# Patient Record
Sex: Female | Born: 1935
Health system: Southern US, Community
[De-identification: ages and names within clinical notes are randomized; demographics above are authoritative.]

## PROBLEM LIST (undated history)

## (undated) DIAGNOSIS — I10 Essential (primary) hypertension: Secondary | ICD-10-CM

## (undated) DIAGNOSIS — J189 Pneumonia, unspecified organism: Secondary | ICD-10-CM

## (undated) DIAGNOSIS — E785 Hyperlipidemia, unspecified: Secondary | ICD-10-CM

## (undated) DIAGNOSIS — F0280 Dementia in other diseases classified elsewhere without behavioral disturbance: Secondary | ICD-10-CM

## (undated) DIAGNOSIS — M1990 Unspecified osteoarthritis, unspecified site: Secondary | ICD-10-CM

## (undated) DIAGNOSIS — R569 Unspecified convulsions: Secondary | ICD-10-CM

## (undated) DIAGNOSIS — E039 Hypothyroidism, unspecified: Secondary | ICD-10-CM

## (undated) DIAGNOSIS — H409 Unspecified glaucoma: Secondary | ICD-10-CM

## (undated) DIAGNOSIS — E559 Vitamin D deficiency, unspecified: Secondary | ICD-10-CM

## (undated) DIAGNOSIS — G309 Alzheimer's disease, unspecified: Secondary | ICD-10-CM

## (undated) DIAGNOSIS — T7840XA Allergy, unspecified, initial encounter: Secondary | ICD-10-CM

## (undated) DIAGNOSIS — F329 Major depressive disorder, single episode, unspecified: Secondary | ICD-10-CM

## (undated) DIAGNOSIS — B1920 Unspecified viral hepatitis C without hepatic coma: Secondary | ICD-10-CM

## (undated) DIAGNOSIS — K219 Gastro-esophageal reflux disease without esophagitis: Secondary | ICD-10-CM

## (undated) DIAGNOSIS — F32A Depression, unspecified: Secondary | ICD-10-CM

## (undated) DIAGNOSIS — E119 Type 2 diabetes mellitus without complications: Secondary | ICD-10-CM

## (undated) HISTORY — PX: CATARACT EXTRACTION: SUR2

## (undated) HISTORY — PX: ABDOMINAL HYSTERECTOMY: SHX81

## (undated) HISTORY — DX: Allergy, unspecified, initial encounter: T78.40XA

## (undated) HISTORY — DX: Unspecified osteoarthritis, unspecified site: M19.90

## (undated) HISTORY — DX: Vitamin D deficiency, unspecified: E55.9

## (undated) HISTORY — DX: Alzheimer's disease, unspecified: G30.9

## (undated) HISTORY — PX: CORNEAL TRANSPLANT: SHX108

## (undated) HISTORY — DX: Unspecified glaucoma: H40.9

## (undated) HISTORY — PX: EYE SURGERY: SHX253

## (undated) HISTORY — DX: Hyperlipidemia, unspecified: E78.5

## (undated) HISTORY — DX: Dementia in other diseases classified elsewhere without behavioral disturbance: F02.80

---

## 1998-03-05 ENCOUNTER — Emergency Department (HOSPITAL_COMMUNITY): Admission: EM | Admit: 1998-03-05 | Discharge: 1998-03-05 | Payer: Self-pay

## 2001-03-30 ENCOUNTER — Emergency Department (HOSPITAL_COMMUNITY): Admission: EM | Admit: 2001-03-30 | Discharge: 2001-03-30 | Payer: Self-pay | Admitting: Emergency Medicine

## 2005-12-22 ENCOUNTER — Emergency Department (HOSPITAL_COMMUNITY): Admission: EM | Admit: 2005-12-22 | Discharge: 2005-12-22 | Payer: Self-pay | Admitting: Family Medicine

## 2006-01-06 ENCOUNTER — Emergency Department (HOSPITAL_COMMUNITY): Admission: EM | Admit: 2006-01-06 | Discharge: 2006-01-06 | Payer: Self-pay | Admitting: Family Medicine

## 2006-03-11 ENCOUNTER — Ambulatory Visit (HOSPITAL_COMMUNITY): Admission: RE | Admit: 2006-03-11 | Discharge: 2006-03-11 | Payer: Self-pay | Admitting: Internal Medicine

## 2006-10-28 ENCOUNTER — Emergency Department (HOSPITAL_COMMUNITY): Admission: EM | Admit: 2006-10-28 | Discharge: 2006-10-28 | Payer: Self-pay | Admitting: Family Medicine

## 2007-01-12 ENCOUNTER — Emergency Department (HOSPITAL_COMMUNITY): Admission: EM | Admit: 2007-01-12 | Discharge: 2007-01-12 | Payer: Self-pay | Admitting: *Deleted

## 2007-02-13 LAB — HM COLONOSCOPY

## 2007-08-15 ENCOUNTER — Emergency Department (HOSPITAL_COMMUNITY): Admission: EM | Admit: 2007-08-15 | Discharge: 2007-08-15 | Payer: Self-pay | Admitting: Emergency Medicine

## 2008-04-03 ENCOUNTER — Inpatient Hospital Stay (HOSPITAL_COMMUNITY): Admission: EM | Admit: 2008-04-03 | Discharge: 2008-04-09 | Payer: Self-pay | Admitting: Emergency Medicine

## 2008-04-05 ENCOUNTER — Encounter (INDEPENDENT_AMBULATORY_CARE_PROVIDER_SITE_OTHER): Payer: Self-pay | Admitting: Internal Medicine

## 2009-03-03 ENCOUNTER — Ambulatory Visit: Payer: Self-pay | Admitting: Gastroenterology

## 2010-05-30 LAB — CBC
Hemoglobin: 10.1 g/dL — ABNORMAL LOW (ref 12.0–15.0)
Hemoglobin: 10.1 g/dL — ABNORMAL LOW (ref 12.0–15.0)
Hemoglobin: 10.1 g/dL — ABNORMAL LOW (ref 12.0–15.0)
Hemoglobin: 11.8 g/dL — ABNORMAL LOW (ref 12.0–15.0)
Hemoglobin: 9.7 g/dL — ABNORMAL LOW (ref 12.0–15.0)
MCHC: 34.6 g/dL (ref 30.0–36.0)
MCHC: 34.6 g/dL (ref 30.0–36.0)
MCHC: 34.7 g/dL (ref 30.0–36.0)
MCV: 100.1 fL — ABNORMAL HIGH (ref 78.0–100.0)
MCV: 99.6 fL (ref 78.0–100.0)
RBC: 2.79 MIL/uL — ABNORMAL LOW (ref 3.87–5.11)
RBC: 2.89 MIL/uL — ABNORMAL LOW (ref 3.87–5.11)
RDW: 13.1 % (ref 11.5–15.5)
RDW: 13.4 % (ref 11.5–15.5)
RDW: 13.4 % (ref 11.5–15.5)
WBC: 14.2 10*3/uL — ABNORMAL HIGH (ref 4.0–10.5)
WBC: 8.6 10*3/uL (ref 4.0–10.5)

## 2010-05-30 LAB — BASIC METABOLIC PANEL
CO2: 23 mEq/L (ref 19–32)
CO2: 25 mEq/L (ref 19–32)
CO2: 26 mEq/L (ref 19–32)
Calcium: 8.6 mg/dL (ref 8.4–10.5)
Calcium: 8.8 mg/dL (ref 8.4–10.5)
Calcium: 9.3 mg/dL (ref 8.4–10.5)
Chloride: 110 mEq/L (ref 96–112)
Creatinine, Ser: 0.58 mg/dL (ref 0.4–1.2)
Creatinine, Ser: 0.58 mg/dL (ref 0.4–1.2)
GFR calc Af Amer: >60 mL/min (ref >60)
GFR calc Af Amer: >60 mL/min (ref >60)
GFR calc non Af Amer: >60 mL/min (ref >60)
GFR calc non Af Amer: >60 mL/min (ref >60)
Glucose, Bld: 112 mg/dL — ABNORMAL HIGH (ref 70–99)
Glucose, Bld: 129 mg/dL — ABNORMAL HIGH (ref 70–99)
Sodium: 139 mEq/L (ref 135–145)
Sodium: 140 mEq/L (ref 135–145)

## 2010-05-30 LAB — CULTURE, BLOOD (ROUTINE X 2): Culture: NO GROWTH

## 2010-05-30 LAB — URINALYSIS, ROUTINE W REFLEX MICROSCOPIC
Glucose, UA: NEGATIVE mg/dL
Nitrite: NEGATIVE
Protein, ur: NEGATIVE mg/dL
pH: 5.5 (ref 5.0–8.0)

## 2010-05-30 LAB — CARDIAC PANEL(CRET KIN+CKTOT+MB+TROPI)
CK, MB: 2 ng/mL (ref 0.3–4.0)
Relative Index: INVALID (ref 0.0–2.5)
Relative Index: INVALID (ref 0.0–2.5)
Troponin I: 0.01 ng/mL (ref 0.00–0.06)

## 2010-05-30 LAB — IRON AND TIBC
Iron: 32 ug/dL — ABNORMAL LOW (ref 42–135)
Saturation Ratios: 20 % (ref 20–55)
TIBC: 158 ug/dL — ABNORMAL LOW (ref 250–470)
UIBC: 126 ug/dL

## 2010-05-30 LAB — POCT CARDIAC MARKERS: Myoglobin, poc: 143 ng/mL (ref 12–200)

## 2010-05-30 LAB — TSH: TSH: 2.013 u[IU]/mL (ref 0.350–4.500)

## 2010-05-30 LAB — POCT I-STAT, CHEM 8
BUN: 19 mg/dL (ref 6–23)
Calcium, Ion: 1.15 mmol/L (ref 1.12–1.32)
HCT: 35 % — ABNORMAL LOW (ref 36.0–46.0)
Hemoglobin: 11.9 g/dL — ABNORMAL LOW (ref 12.0–15.0)
Sodium: 135 mEq/L (ref 135–145)
TCO2: 24 mmol/L (ref 0–100)

## 2010-05-30 LAB — TROPONIN I: Troponin I: 0.03 ng/mL (ref 0.00–0.06)

## 2010-05-30 LAB — GLUCOSE, CAPILLARY: Glucose-Capillary: 156 mg/dL — ABNORMAL HIGH (ref 70–99)

## 2010-05-30 LAB — LIPID PANEL
Cholesterol: 164 mg/dL (ref 0–200)
HDL: 65 mg/dL (ref >39)
Triglycerides: 64 mg/dL (ref <150)

## 2010-05-30 LAB — CK TOTAL AND CKMB (NOT AT ARMC): CK, MB: 1.1 ng/mL (ref 0.3–4.0)

## 2010-05-30 LAB — DIFFERENTIAL
Basophils Absolute: 0 10*3/uL (ref 0.0–0.1)
Lymphocytes Relative: 12 % (ref 12–46)
Monocytes Absolute: 1 10*3/uL (ref 0.1–1.0)
Neutro Abs: 8.6 10*3/uL — ABNORMAL HIGH (ref 1.7–7.7)
Neutrophils Relative %: 79 % — ABNORMAL HIGH (ref 43–77)

## 2010-05-30 LAB — HEMOGLOBIN A1C: Hgb A1c MFr Bld: 6 % (ref 4.6–6.1)

## 2010-05-30 LAB — VITAMIN B12: Vitamin B-12: 270 pg/mL (ref 211–911)

## 2010-05-30 LAB — PROTIME-INR
INR: 1.1 (ref 0.00–1.49)
Prothrombin Time: 14.5 seconds (ref 11.6–15.2)

## 2010-05-30 LAB — APTT: aPTT: 33 seconds (ref 24–37)

## 2010-06-27 NOTE — Group Therapy Note (Signed)
[name], [name]             ACCOUNT NO.:  [id number]   MEDICAL RECORD NO.:  [id number]          PATIENT TYPE:  INP   LOCATION:  5019                         FACILITY:  [facility]   PHYSICIAN:  Eduard Clos, MDDATE OF BIRTH:  [birth year]                                 PROGRESS NOTE   HOSPITAL COURSE:  An 77 year old female with history of hypertension, hyperlipidemia,  arthritis who had recurrent falls and was brought into the ER.  On  admission in the ER, the patient had a CT of the head which did not show  any acute findings.  X-ray of his chest showed pneumonia.  The patient  was admitted to telemetry floor.  Cardiac enzymes were followed.   The patient had an x-ray of the hip which was suggestive of possible  fracture, and MRI of the hip on the left side was done which showed no  fractures.  The patient was monitored on a telemetry bed.  The patient  did have some abnormal beats with possibility of NSVT.  Cardiology was  consulted.  Cardiology consult was obtained with Dr. Reyes Ivan who advised  that the patient had artifact and it was felt NSVT.  Further  recommendations and followup will be done as an outpatient with regard  to cardiac workup.   The patient at this time is to stay on Rocephin and Zithromax, and today  she had another spike of fever, though the patient is not in any acute  distress.  We will repeat a chest x-ray in the a.m.  We will continue  the present antibiotics.  Will try to get a sputum culture and  sensitivity.  Blood cultures have been so far negative.  PT consult was  obtained who advised the patient can be discharged once ready with home  OT and PT.   PROCEDURES DONE DURING THE STAY:  1. CT of the head without contrast on April 02, 2008.  Atrophy and      small vessel disease.  No acute abnormality.  2. X-ray of the knee on April 02, 2008.  Stable degenerative      changes.  No fracture or effusion.  3. X-ray of the hip of April 02, 2008 shows no definite acute      fracture, but it is too difficult to exclude a subtle left      subcapital femoral fracture.  Consider MRI.  4. Chest x-ray April 02, 2008 shows slight prominent marking at the      left lung base.  Cannot exclude pneumonia.  __________ right upper      __________.  5. MRI of the hip, left, April 03, 2008 shows osteoarthritic      changes of both hips without spurring at the base of the femoral      head.  No acute abnormalities.   FINAL DIAGNOSES:  1. Pneumonia.  2. Hypertension.  3. Arthritis.  4. Hyperlipidemia.  5. Anemia probably of chronic disease.   PLAN:  The patient is presently on antibiotics.  Will repeat a chest x-ray.  If  the patient's symptoms and fever settles, the  patient probably can be  discharged home with OT and PT, with remaining days of antibiotics.      Eduard Clos, MD  Electronically Signed     ANK/MEDQ  D:  04/06/2008  T:  04/06/2008  Job:  [id number]

## 2010-06-27 NOTE — Discharge Summary (Signed)
NAMEIVON, ROEDEL             ACCOUNT NO.:  [id number]   MEDICAL RECORD NO.:  [id number]          PATIENT TYPE:  INP   LOCATION:  5019                         FACILITY:  [facility]   PHYSICIAN:  Isidor Holts, M.D.  DATE OF BIRTH:  [birth year]   DATE OF ADMISSION:  04/02/2008  DATE OF DISCHARGE:                               DISCHARGE SUMMARY   PMD:  Dr. Marisue Brooklyn.   For discharge diagnoses, refer to interim summary dictated April 06, 2008, by Dr. Midge Minium. Refer to above summary for details of  admission history, procedures and detailed clinical course. In addition  however, for the period from the April 07, 2008 to April 08, 2008,  i.e. the date of this dictation, the following are pertinent:  The  patient had spiked a pyrexia of 101.3 in the a.m. of April 06, 2008,  but through April 07, 2008 and April 08, 2008, there was no  relapse. The patient was transitioned to monotherapy with Avelox from  a.m. of April 08, 2008, and Rocephin/Azithromycin were discontinued  after 5 days of antibiotic therapy, without any deleterious  consequences.  White cell count as of April 08, 2008, had normalized  at 8.5. The patient felt considerably better and chest x-ray of April 07, 2008 showed coarsening of the pulmonary interstitium, mild patchy  airspace disease in the left lung base showed some improvement.  There  was no pleural effusion. Heart size was normal. Blood pressure was  addressed with titration of antihypertensive medications, and by  April 08, 2008, the patient was normotensive with a BP 123/57 mmHg.  She was continued on statin for her history of dyslipidemia.  Osteoarthritis was addressed with simple analgesics, and the patient was  evaluated by PT/OT, who have recommended continued PT/OT in the home  environment, in addition to home safety evaluation as well as rolling  walker and tub transfer bench.  All these have been arranged.   The  patient's iron studies showed the following findings.  Iron 32, TIBC  158, percentage saturation 20.  B12 was marginal at 270, folate was  normal at 17.5, ferritin was 455.  These findings appeared consistent  with anemia of chronic disease in association with borderline B12  deficiency.  The patient has been started on Vitamin B12 oral  supplements, accordingly.  Hemoglobin as of April 08, 2008, was  considered reasonable at 10.1 with a hematocrit of 29.3 and MCV of 98.4.  The decision as to whether the patient will merit GI workup for her  anemia, will be deferred to the patient's primary MD, Dr. Marisue Brooklyn.  This may be particularly pertinent, if the patient has not had a routine  screening endoscopy at appropriate intervals.   DISPOSITION:  The patient was on April 08, 2008, considered  clinically stable for discharge on April 09, 2008, provided no acute  problems arise in the interim.  She is anticipated to complete a further  7 days of oral antibiotic therapy on April 16, 2008.   DISCHARGE MEDICATIONS:  1. Micardis 80 mg p.o. daily.  2. Vitamin C 1000 mg p.o.  daily.  3. Vitamin D 2000 international units p.o. daily.  4. Vitamin B12 1000 mcg p.o. daily.  5. Meloxicam 7.5 mg p.o p.r.n. daily  6. Tylenol 650 mm p.o. p.r.n. q. 6 hourly.  7. Avelox 400 mg p.o. daily x 7 days, from 04/10/08.  8. Levobunolol 0.5% eye drops. 1 drop left eye b.i.d.  9. Lotemax 0.5% eye drops. 1 drop each eye q.i.d.  10.Lovastatin 40 mg p.o. q.h.s.   DIET:  Heart-healthy.   ACTIVITY:  Recommended to increase activity slowly. Otherwise per PT/OT.   FOLLOW-UP INSTRUCTIONS:  The patient is to follow up with her primary  MD, Dr. Marisue Brooklyn, in 1 week. Family has been instructed to call for  an appointment.   SPECIAL INSTRUCTIONS:  Home health PT/OT  has been arranged as well as  rolling walker and tub transfer bench.      Isidor Holts, M.D.  Electronically Signed      CO/MEDQ  D:  04/08/2008  T:  04/08/2008  Job:  [id number]   cc:   Lovenia Kim, D.O.

## 2010-06-27 NOTE — H&P (Signed)
[name], [name] NO.:  [id number]   MEDICAL RECORD NO.:  [id number]          PATIENT TYPE:  EMS   LOCATION:  MAJO                         FACILITY:  [facility]   PHYSICIAN:  Vania Rea, M.D. DATE OF BIRTH:  1922-11-26   DATE OF ADMISSION:  04/03/2008  DATE OF DISCHARGE:                              HISTORY & PHYSICAL   PRIMARY CARE PHYSICIAN:  Dr. Lovenia Kim.   CHIEF COMPLAINT:  Fall.   HISTORY OF PRESENT ILLNESS:  This is an 77 year old African American  lady with a history of hypertension and arthritis and recurrent visits  to the emergency room for falls, who is brought into the emergency room  with her daughters who complain that she has been getting progressively  weak over the past 1-week, and she reports that she fell trying to get  out of bed.  The daughters have left and are no longer currently  available, and the patient only reports a fall.  She denies any fever,  cough or cold.  She denies any chest pains or shortness of breath.  However, her history is somewhat unreliable because at time her mind  seems to be wandering.   PAST MEDICAL HISTORY:  1. Hypertension.  2. Arthritis.  3. Hyperlipidemia.   PAST SURGICAL HISTORY:  Hysterectomy.   MEDICATIONS:  She is unsure of the dosages, but records list atenolol,  lovastatin, Diovan and Pepcid.   ALLERGIES:  NO KNOWN DRUG ALLERGIES.   SOCIAL HISTORY:  No history of tobacco, alcohol or illicit drug use.   FAMILY HISTORY:  She denies any knowledge of any family medical  problems.   REVIEW OF SYSTEMS:  Other than noted above significant only for  constipation and blindness in the right eye.   PHYSICAL EXAMINATION:  GENERAL:  An elderly African American lady  smiling pleasantly, lying in bed.  VITAL SIGNS:  Her temperature is 100, pulse 106, respirations 20, blood  pressure 165/75.  She is saturating at 97% on room air.  HEENT:  Her left pupil is small, her right pupil is opaque.  NECK:  She has no cervical lymphadenopathy or thyromegaly.  No carotid  bruit.  CHEST:  She has decreased breath sounds in the right chest.  CARDIOVASCULAR:  Regular rhythm.  No murmur heard.  ABDOMEN:  Soft and nontender.  There are no masses.  EXTREMITIES:  Without edema.  She has abrasions on the right knee.  CENTRAL NERVOUS SYSTEM:  Cranial nerves II through XII grossly intact.  She has no focal lateralizing signs.  The patient seems to have great  difficulty supporting herself to walk, although she alleges she can  walk.   LABORATORY DATA:  White count is slightly elevated at 11.0, hemoglobin  is 11.8, platelets 139, absolute granulocyte count is elevated at 6.6.  Cardiac enzymes are completely normal.  Her serum chemistry is  essentially normal.  BUN is 19 and creatinine is 1.1.  Glucose is 164.  Two-view chest x-ray shows slightly prominent markings in the left lung  base, cannot exclude pneumonia.  Her CT scan of the pelvis - no definite  acute fracture, but it is difficult to exclude a subtle left subcapital  femoral fracture.  Consider a non-emergent MRI to assess further.  X-  rays of the knee show no fractures or effusions.   ASSESSMENT:  1. Progressive weakness, possibly related to left lower lobe      pneumonia.  2. Fall with impaired mobility.  3. Fever and tachycardia, differential includes a pulmonary embolus.  4. Probable mild dementia.   PLAN:  1. We will admit this lady for hydration and antibiotics.  We will      check her D-dimer and if elevated, we will go ahead and get a CT      angiogram of the chest.  We will order an MRI of the hip and if      there is no fracture, then will recommend her for physical therapy      to improve her gait.  2. We will monitor her blood pressure and request medications from the      pharmacy so we can continue to control her blood pressure and her      blood sugar.  3. For her hyperglycemia we will check a hemoglobin  A1c.      Vania Rea, M.D.  Electronically Signed     LC/MEDQ  D:  04/03/2008  T:  04/03/2008  Job:  [id number]   cc:   Lovenia Kim, D.O.

## 2010-11-07 ENCOUNTER — Other Ambulatory Visit: Payer: Self-pay | Admitting: Internal Medicine

## 2010-11-07 ENCOUNTER — Other Ambulatory Visit (HOSPITAL_COMMUNITY): Payer: Self-pay | Admitting: Internal Medicine

## 2010-11-07 DIAGNOSIS — B1920 Unspecified viral hepatitis C without hepatic coma: Secondary | ICD-10-CM

## 2010-11-07 DIAGNOSIS — T17308A Unspecified foreign body in larynx causing other injury, initial encounter: Secondary | ICD-10-CM

## 2010-11-09 ENCOUNTER — Ambulatory Visit
Admission: RE | Admit: 2010-11-09 | Discharge: 2010-11-09 | Disposition: A | Discharge status: Home / Self Care | Payer: Medicare Other | Source: Ambulatory Visit | Attending: Internal Medicine | Admitting: Internal Medicine

## 2010-11-09 DIAGNOSIS — B1920 Unspecified viral hepatitis C without hepatic coma: Secondary | ICD-10-CM

## 2010-11-16 ENCOUNTER — Ambulatory Visit (HOSPITAL_COMMUNITY)
Admission: RE | Admit: 2010-11-16 | Discharge: 2010-11-16 | Disposition: A | Discharge status: Home / Self Care | Payer: Medicare Other | Source: Ambulatory Visit | Attending: Internal Medicine | Admitting: Internal Medicine

## 2010-11-16 DIAGNOSIS — R1310 Dysphagia, unspecified: Secondary | ICD-10-CM | POA: Insufficient documentation

## 2010-11-16 DIAGNOSIS — B1920 Unspecified viral hepatitis C without hepatic coma: Secondary | ICD-10-CM | POA: Insufficient documentation

## 2010-11-16 DIAGNOSIS — K219 Gastro-esophageal reflux disease without esophagitis: Secondary | ICD-10-CM | POA: Insufficient documentation

## 2010-11-16 DIAGNOSIS — E119 Type 2 diabetes mellitus without complications: Secondary | ICD-10-CM | POA: Insufficient documentation

## 2010-11-16 DIAGNOSIS — R0609 Other forms of dyspnea: Secondary | ICD-10-CM | POA: Insufficient documentation

## 2010-11-16 DIAGNOSIS — I10 Essential (primary) hypertension: Secondary | ICD-10-CM | POA: Insufficient documentation

## 2010-11-16 DIAGNOSIS — R6889 Other general symptoms and signs: Secondary | ICD-10-CM | POA: Insufficient documentation

## 2010-11-16 DIAGNOSIS — T17308A Unspecified foreign body in larynx causing other injury, initial encounter: Secondary | ICD-10-CM

## 2010-11-16 DIAGNOSIS — R0989 Other specified symptoms and signs involving the circulatory and respiratory systems: Secondary | ICD-10-CM | POA: Insufficient documentation

## 2012-01-30 ENCOUNTER — Emergency Department (HOSPITAL_COMMUNITY)
Admission: EM | Admit: 2012-01-30 | Discharge: 2012-01-30 | Disposition: A | Discharge status: Home / Self Care | Payer: BC Managed Care – PPO | Source: Home / Self Care | Attending: Emergency Medicine | Admitting: Emergency Medicine

## 2012-01-30 ENCOUNTER — Encounter (HOSPITAL_COMMUNITY): Payer: Self-pay | Admitting: Emergency Medicine

## 2012-01-30 DIAGNOSIS — R296 Repeated falls: Secondary | ICD-10-CM

## 2012-01-30 DIAGNOSIS — Z9181 History of falling: Secondary | ICD-10-CM

## 2012-01-30 DIAGNOSIS — M25569 Pain in unspecified knee: Secondary | ICD-10-CM

## 2012-01-30 HISTORY — DX: Unspecified osteoarthritis, unspecified site: M19.90

## 2012-01-30 HISTORY — DX: Essential (primary) hypertension: I10

## 2012-01-30 NOTE — ED Notes (Signed)
Daughter brings pt in for bilateral knee pain/soreness x3 days... Saw PCP yesterday in re: to pain and has been setup w/PT... Daughter states that the pt has fallen x2 today and they just want to make sure she did not brake anything... Pt is alert and responsive.

## 2012-01-30 NOTE — ED Provider Notes (Signed)
History     CSN: [id number]  Arrival date & time 01/30/12  1741   First MD Initiated Contact with Patient 01/30/12 1800      Chief Complaint  Patient presents with  . Knee Pain    (Consider location/radiation/quality/duration/timing/severity/associated sxs/prior treatment) HPI Comments: To spring her in to be checked for bilateral knee pain and soreness for 3 days. They describe that she fell today to times when trying to walk landing on her knees. This will make sure that she did not fracture her knees or another bone?!!  Mrs. Wednesday are you hurting anywhere? No, I'm not sore anywhere. (Patient is brought in one of our wheelchairs and on request it is extending both of her knees to show me)  Patient is a 78 y.o. female presenting with knee pain. The history is provided by the patient.  Knee Pain This is a new problem. The problem has not changed since onset.Pertinent negatives include no chest pain and no shortness of breath. The symptoms are aggravated by bending (Walking and). Nothing relieves the symptoms. She has tried nothing for the symptoms. The treatment provided no relief.    Past Medical History  Diagnosis Date  . Arthritis   . Hypertension   . Diabetes mellitus without complication     History reviewed. No pertinent past surgical history.  No family history on file.  History  Substance Use Topics  . Smoking status: Never Smoker   . Smokeless tobacco: Not on file  . Alcohol Use: No    OB History    Grav Para Term Preterm Abortions TAB SAB Ect Mult Living                  Review of Systems  Constitutional: Positive for activity change. Negative for fever, chills and appetite change.  Respiratory: Negative for cough and shortness of breath.   Cardiovascular: Negative for chest pain.  Gastrointestinal: Negative for nausea.  Neurological: Negative for dizziness and facial asymmetry.    Allergies  Review of patient's allergies indicates no known  allergies.  Home Medications  No current outpatient prescriptions on file.  BP 132/50  Pulse 76  Temp 99.2 F (37.3 C) (Oral)  Resp 18  SpO2 99%  Physical Exam  Nursing note and vitals reviewed. Constitutional: Vital signs are normal.  Non-toxic appearance. She does not have a sickly appearance. She does not appear ill. No distress.    Musculoskeletal: She exhibits no edema and no tenderness.       Right shoulder: She exhibits normal range of motion and no deformity.       Right knee: She exhibits normal range of motion, no swelling, no effusion, no ecchymosis and no deformity.       Left knee: She exhibits normal range of motion, no swelling, no effusion, no ecchymosis and no deformity.       Legs: Neurological: She is alert.  Skin: No rash noted. No erythema.    ED Course  Procedures (including critical care time)  Labs Reviewed - No data to display No results found.   1. Falls frequently       MDM  Family members bring this pulling tonight to be checked for her niece's A. suspect she is hurting in both knees. She has been falling frequently and was seen by her doctor yesterday. Relatives describe a possible physical therapy- coordination of care.         Jimmie Molly, MD 01/30/12 [phone]

## 2012-06-09 ENCOUNTER — Emergency Department (HOSPITAL_COMMUNITY): Payer: Medicare Other

## 2012-06-09 ENCOUNTER — Encounter (HOSPITAL_COMMUNITY): Payer: Self-pay | Admitting: Emergency Medicine

## 2012-06-09 ENCOUNTER — Inpatient Hospital Stay (HOSPITAL_COMMUNITY)
Admission: EM | Admit: 2012-06-09 | Discharge: 2012-06-12 | DRG: 640 | Disposition: A | Discharge status: Home Health | Payer: Medicare Other | Attending: Internal Medicine | Admitting: Internal Medicine

## 2012-06-09 DIAGNOSIS — R4182 Altered mental status, unspecified: Secondary | ICD-10-CM

## 2012-06-09 DIAGNOSIS — E119 Type 2 diabetes mellitus without complications: Secondary | ICD-10-CM

## 2012-06-09 DIAGNOSIS — G92 Toxic encephalopathy: Secondary | ICD-10-CM | POA: Diagnosis present

## 2012-06-09 DIAGNOSIS — E860 Dehydration: Principal | ICD-10-CM

## 2012-06-09 DIAGNOSIS — D638 Anemia in other chronic diseases classified elsewhere: Secondary | ICD-10-CM | POA: Diagnosis present

## 2012-06-09 DIAGNOSIS — E1121 Type 2 diabetes mellitus with diabetic nephropathy: Secondary | ICD-10-CM | POA: Diagnosis present

## 2012-06-09 DIAGNOSIS — Z79899 Other long term (current) drug therapy: Secondary | ICD-10-CM

## 2012-06-09 DIAGNOSIS — G929 Unspecified toxic encephalopathy: Secondary | ICD-10-CM | POA: Diagnosis present

## 2012-06-09 DIAGNOSIS — R4702 Dysphasia: Secondary | ICD-10-CM | POA: Diagnosis present

## 2012-06-09 DIAGNOSIS — R627 Adult failure to thrive: Secondary | ICD-10-CM

## 2012-06-09 DIAGNOSIS — K5900 Constipation, unspecified: Secondary | ICD-10-CM

## 2012-06-09 DIAGNOSIS — G9340 Encephalopathy, unspecified: Secondary | ICD-10-CM

## 2012-06-09 DIAGNOSIS — R1310 Dysphagia, unspecified: Secondary | ICD-10-CM | POA: Diagnosis present

## 2012-06-09 DIAGNOSIS — B1920 Unspecified viral hepatitis C without hepatic coma: Secondary | ICD-10-CM

## 2012-06-09 DIAGNOSIS — Z66 Do not resuscitate: Secondary | ICD-10-CM | POA: Diagnosis present

## 2012-06-09 DIAGNOSIS — E785 Hyperlipidemia, unspecified: Secondary | ICD-10-CM

## 2012-06-09 DIAGNOSIS — I10 Essential (primary) hypertension: Secondary | ICD-10-CM

## 2012-06-09 DIAGNOSIS — R5381 Other malaise: Secondary | ICD-10-CM | POA: Diagnosis present

## 2012-06-09 DIAGNOSIS — D649 Anemia, unspecified: Secondary | ICD-10-CM

## 2012-06-09 DIAGNOSIS — E782 Mixed hyperlipidemia: Secondary | ICD-10-CM | POA: Diagnosis present

## 2012-06-09 HISTORY — DX: Unspecified viral hepatitis C without hepatic coma: B19.20

## 2012-06-09 LAB — CBC WITH DIFFERENTIAL/PLATELET
Basophils Absolute: 0 10*3/uL (ref 0.0–0.1)
Basophils Relative: 0 % (ref 0–1)
HCT: 31.9 % — ABNORMAL LOW (ref 36.0–46.0)
Lymphocytes Relative: 34 % (ref 12–46)
Neutro Abs: 5.7 10*3/uL (ref 1.7–7.7)
Neutrophils Relative %: 55 % (ref 43–77)
Platelets: 247 10*3/uL (ref 150–400)
RDW: 13.3 % (ref 11.5–15.5)
WBC: 10.2 10*3/uL (ref 4.0–10.5)

## 2012-06-09 LAB — COMPREHENSIVE METABOLIC PANEL
ALT: 38 U/L — ABNORMAL HIGH (ref 0–35)
AST: 37 U/L (ref 0–37)
Albumin: 3.3 g/dL — ABNORMAL LOW (ref 3.5–5.2)
CO2: 29 mEq/L (ref 19–32)
Chloride: 100 mEq/L (ref 96–112)
GFR calc non Af Amer: 45 mL/min — ABNORMAL LOW (ref >90)
Potassium: 4.8 mEq/L (ref 3.5–5.1)
Sodium: 137 mEq/L (ref 135–145)
Total Bilirubin: 0.4 mg/dL (ref 0.3–1.2)

## 2012-06-09 MED ORDER — SODIUM CHLORIDE 0.9 % IV SOLN: INTRAVENOUS | Status: DC

## 2012-06-09 NOTE — ED Notes (Signed)
Pts family states "She is weak and is not eating or drinking and keeps falling"

## 2012-06-09 NOTE — ED Notes (Signed)
[name] [phone] Password: God is

## 2012-06-09 NOTE — ED Provider Notes (Signed)
History     CSN: [id number]  Arrival date & time 06/09/12  1622   First MD Initiated Contact with Patient 06/09/12 2301      Chief Complaint  Patient presents with  . Weakness    (Consider location/radiation/quality/duration/timing/severity/associated sxs/prior treatment) HPI Level 5 Caveat: altered mental status. This is an 79 year old female who has had a general decline over the last 2 months. This is worsened over the past week. Specifically her family states that she has become more lethargic, with decreased appetite, decreased intake, decreased urine output and constipation. She has also been falling frequently, partly due to chronic pain in her right knee. For the past 2 days she has been essentially bedbound. She has been confused and refusing to use at all. She was seen by her primary care physician in his office today and was sent to the ED. Symptoms are moderate to severe. There no specific exacerbating or mitigating factors except that her constipation was partially relieved by manual disimpaction by a relative who is an LPN.  Past Medical History  Diagnosis Date  . Arthritis   . Hypertension   . Diabetes mellitus without complication   . Hepatitis C     History reviewed. No pertinent past surgical history.  No family history on file.  History  Substance Use Topics  . Smoking status: Never Smoker   . Smokeless tobacco: Not on file  . Alcohol Use: No    OB History   Grav Para Term Preterm Abortions TAB SAB Ect Mult Living                  Review of Systems  Unable to perform ROS   Allergies  Review of patient's allergies indicates no known allergies.  Home Medications   Current Outpatient Rx  Name  Route  Sig  Dispense  Refill  . diclofenac (VOLTAREN) 50 MG EC tablet   Oral   Take 50 mg by mouth 2 (two) times daily.         Marland Kitchen levobunolol (BETAGAN) 0.5 % ophthalmic solution   Both Eyes   Place 1 drop into both eyes 2 (two) times daily.          Marland Kitchen losartan (COZAAR) 100 MG tablet   Oral   Take 100 mg by mouth daily.         Marland Kitchen lovastatin (MEVACOR) 40 MG tablet   Oral   Take 40 mg by mouth at bedtime.         . metFORMIN (GLUMETZA) 500 MG (MOD) 24 hr tablet   Oral   Take 500-1,000 mg by mouth 2 (two) times daily with a meal. Takes 1 tablet(500mg ) every morning  And 2 tablets(1000mg ) every evening           BP 158/56  Pulse 58  Temp(Src) 97.9 F (36.6 C) (Oral)  Resp 16  SpO2 94%  Physical Exam General: Well-developed, well-nourished female in no acute distress; appearance consistent with age of record HENT: normocephalic, atraumatic Eyes: Right pupil obscured by opaque cornea; left pupil round reactive with partial opacity of left cornea Neck: supple Heart: regular rate and rhythm; distant sounds Lungs: clear to auscultation bilaterally Abdomen: soft; nondistended; nontender; bowel sounds present Extremities: Arthritic changes; pulses normal; no edema Neurologic: Awake, alert and oriented to person and place of; motor function intact in all extremities and symmetric but with general weakness and poor effort; no facial droop Skin: Warm and dry     ED Course  Procedures (including critical care time)     MDM   Nursing notes and vitals signs, including pulse oximetry, reviewed.  Summary of this visit's results, reviewed by myself:  Labs:  Results for orders placed during the hospital encounter of 06/09/12 (from the past 24 hour(s))  CBC WITH DIFFERENTIAL     Status: Abnormal   Collection Time    06/09/12  6:10 PM      Result Value Range   WBC 10.2  4.0 - 10.5 K/uL   RBC 3.30 (*) 3.87 - 5.11 MIL/uL   Hemoglobin 10.7 (*) 12.0 - 15.0 g/dL   HCT 16.1 (*) 09.6 - 04.5 %   MCV 96.7  78.0 - 100.0 fL   MCH 32.4  26.0 - 34.0 pg   MCHC 33.5  30.0 - 36.0 g/dL   RDW 40.9  81.1 - 91.4 %   Platelets 247  150 - 400 K/uL   Neutrophils Relative 55  43 - 77 %   Neutro Abs 5.7  1.7 - 7.7 K/uL   Lymphocytes  Relative 34  12 - 46 %   Lymphs Abs 3.4  0.7 - 4.0 K/uL   Monocytes Relative 9  3 - 12 %   Monocytes Absolute 1.0  0.1 - 1.0 K/uL   Eosinophils Relative 1  0 - 5 %   Eosinophils Absolute 0.1  0.0 - 0.7 K/uL   Basophils Relative 0  0 - 1 %   Basophils Absolute 0.0  0.0 - 0.1 K/uL  COMPREHENSIVE METABOLIC PANEL     Status: Abnormal   Collection Time    06/09/12  6:10 PM      Result Value Range   Sodium 137  135 - 145 mEq/L   Potassium 4.8  3.5 - 5.1 mEq/L   Chloride 100  96 - 112 mEq/L   CO2 29  19 - 32 mEq/L   Glucose, Bld 116 (*) 70 - 99 mg/dL   BUN 24 (*) 6 - 23 mg/dL   Creatinine, Ser 7.82  0.50 - 1.10 mg/dL   Calcium 95.6 (*) 8.4 - 10.5 mg/dL   Total Protein 8.2  6.0 - 8.3 g/dL   Albumin 3.3 (*) 3.5 - 5.2 g/dL   AST 37  0 - 37 U/L   ALT 38 (*) 0 - 35 U/L   Alkaline Phosphatase 60  39 - 117 U/L   Total Bilirubin 0.4  0.3 - 1.2 mg/dL   GFR calc non Af Amer 45 (*) >90 mL/min   GFR calc Af Amer 53 (*) >90 mL/min  URINALYSIS, ROUTINE W REFLEX MICROSCOPIC     Status: Abnormal   Collection Time    06/09/12 11:47 PM      Result Value Range   Color, Urine YELLOW  YELLOW   APPearance CLOUDY (*) CLEAR   Specific Gravity, Urine 1.015  1.005 - 1.030   pH 5.5  5.0 - 8.0   Glucose, UA NEGATIVE  NEGATIVE mg/dL   Hgb urine dipstick NEGATIVE  NEGATIVE   Bilirubin Urine NEGATIVE  NEGATIVE   Ketones, ur NEGATIVE  NEGATIVE mg/dL   Protein, ur NEGATIVE  NEGATIVE mg/dL   Urobilinogen, UA 1.0  0.0 - 1.0 mg/dL   Nitrite NEGATIVE  NEGATIVE   Leukocytes, UA SMALL (*) NEGATIVE  URINE MICROSCOPIC-ADD ON     Status: Abnormal   Collection Time    06/09/12 11:47 PM      Result Value Range   Squamous Epithelial / LPF FEW (*) RARE  WBC, UA 3-6  <3 WBC/hpf  AMMONIA     Status: None   Collection Time    06/10/12 12:10 AM      Result Value Range   Ammonia 23  11 - 60 umol/L  TROPONIN I     Status: None   Collection Time    06/10/12 12:10 AM      Result Value Range   Troponin I <0.30  <0.30  ng/mL    Imaging Studies: Ct Head Wo Contrast  06/09/2012  *RADIOLOGY REPORT*  Clinical Data: Weakness.  Multiple falls.  Not eating or drinking.  CT HEAD WITHOUT CONTRAST  Technique:  Contiguous axial images were obtained from the base of the skull through the vertex without contrast.  Comparison: 04/02/2008  Findings: Mild diffuse cerebral atrophy.  Mild ventricular dilatation consistent with central atrophy.  Low attenuation changes in the periventricular white matter consistent with small vessel ischemia.  No mass effect or midline shift.  No abnormal extra-axial fluid collections.  Gray-white matter junctions are distinct.  Basal cisterns are not effaced.  No depressed skull fractures.  Visualized paranasal sinuses and mastoid air cells are not opacified.  No significant changes since previous study.  IMPRESSION: No acute intracranial abnormalities.  Mild atrophy and small vessel ischemic changes.   Original Report Authenticated By: Burman Nieves, M.D.      Date: 06/09/2012 6:35 PM  Rate: 57  Rhythm: normal bradycardia  QRS Axis: normal  Intervals: normal  ST/T Wave abnormalities: normal  Conduction Disutrbances: none  Narrative Interpretation:   Comparison with previous EKG: none available               Hanley Seamen, MD 06/10/12 0131

## 2012-06-10 ENCOUNTER — Encounter (HOSPITAL_COMMUNITY): Payer: Self-pay | Admitting: Internal Medicine

## 2012-06-10 DIAGNOSIS — R4702 Dysphasia: Secondary | ICD-10-CM | POA: Diagnosis present

## 2012-06-10 DIAGNOSIS — R4182 Altered mental status, unspecified: Secondary | ICD-10-CM

## 2012-06-10 DIAGNOSIS — I10 Essential (primary) hypertension: Secondary | ICD-10-CM | POA: Diagnosis present

## 2012-06-10 DIAGNOSIS — R627 Adult failure to thrive: Secondary | ICD-10-CM

## 2012-06-10 DIAGNOSIS — E860 Dehydration: Principal | ICD-10-CM

## 2012-06-10 DIAGNOSIS — E782 Mixed hyperlipidemia: Secondary | ICD-10-CM | POA: Diagnosis present

## 2012-06-10 DIAGNOSIS — K5900 Constipation, unspecified: Secondary | ICD-10-CM

## 2012-06-10 DIAGNOSIS — D649 Anemia, unspecified: Secondary | ICD-10-CM | POA: Diagnosis present

## 2012-06-10 DIAGNOSIS — B1920 Unspecified viral hepatitis C without hepatic coma: Secondary | ICD-10-CM | POA: Diagnosis present

## 2012-06-10 DIAGNOSIS — E1121 Type 2 diabetes mellitus with diabetic nephropathy: Secondary | ICD-10-CM | POA: Diagnosis present

## 2012-06-10 LAB — AMMONIA: Ammonia: 23 umol/L (ref 11–60)

## 2012-06-10 LAB — URINE MICROSCOPIC-ADD ON

## 2012-06-10 LAB — GLUCOSE, CAPILLARY
Glucose-Capillary: 89 mg/dL (ref 70–99)
Glucose-Capillary: 98 mg/dL (ref 70–99)
Glucose-Capillary: 206 mg/dL — ABNORMAL HIGH (ref 70–99)

## 2012-06-10 LAB — URINALYSIS, ROUTINE W REFLEX MICROSCOPIC
Bilirubin Urine: NEGATIVE
Glucose, UA: NEGATIVE mg/dL
Hgb urine dipstick: NEGATIVE
Protein, ur: NEGATIVE mg/dL
Urobilinogen, UA: 1 mg/dL (ref 0.0–1.0)

## 2012-06-10 LAB — CBC
HCT: 29.6 % — ABNORMAL LOW (ref 36.0–46.0)
Hemoglobin: 10.3 g/dL — ABNORMAL LOW (ref 12.0–15.0)
MCV: 96.1 fL (ref 78.0–100.0)
RBC: 3.08 MIL/uL — ABNORMAL LOW (ref 3.87–5.11)
RDW: 13.1 % (ref 11.5–15.5)
WBC: 7.1 10*3/uL (ref 4.0–10.5)

## 2012-06-10 LAB — TSH: TSH: 2.486 u[IU]/mL (ref 0.350–4.500)

## 2012-06-10 LAB — TROPONIN I: Troponin I: <0.3 ng/mL (ref <0.30)

## 2012-06-10 LAB — CREATININE, SERUM: GFR calc Af Amer: 69 mL/min — ABNORMAL LOW (ref >90)

## 2012-06-10 MED ORDER — BISACODYL 10 MG RE SUPP
10.0000 mg | Freq: Once | RECTAL | Status: AC
  Administered 2012-06-10: 10 mg via RECTAL
  Filled 2012-06-10: qty 1

## 2012-06-10 MED ORDER — POLYETHYLENE GLYCOL 3350 17 G PO PACK
17.0000 g | PACK | Freq: Every day | ORAL | Status: DC | PRN
  Filled 2012-06-10: qty 1

## 2012-06-10 MED ORDER — INSULIN ASPART 100 UNIT/ML ~~LOC~~ SOLN
0.0000 [IU] | Freq: Three times a day (TID) | SUBCUTANEOUS | Status: DC
  Administered 2012-06-10: 1 [IU] via SUBCUTANEOUS
  Administered 2012-06-11 (×2): 2 [IU] via SUBCUTANEOUS
  Administered 2012-06-11 – 2012-06-12 (×2): 1 [IU] via SUBCUTANEOUS

## 2012-06-10 MED ORDER — FLEET ENEMA 7-19 GM/118ML RE ENEM: 1.0000 | ENEMA | Freq: Once | RECTAL | Status: AC | PRN

## 2012-06-10 MED ORDER — SORBITOL 70 % SOLN
30.0000 mL | Freq: Every day | Status: DC | PRN
  Filled 2012-06-10: qty 30

## 2012-06-10 MED ORDER — LOSARTAN POTASSIUM 50 MG PO TABS
100.0000 mg | ORAL_TABLET | Freq: Every day | ORAL | Status: DC
  Administered 2012-06-10 – 2012-06-12 (×3): 100 mg via ORAL
  Filled 2012-06-10 (×4): qty 2

## 2012-06-10 MED ORDER — SIMVASTATIN 20 MG PO TABS
20.0000 mg | ORAL_TABLET | Freq: Every day | ORAL | Status: DC
  Administered 2012-06-10 – 2012-06-11 (×2): 20 mg via ORAL
  Filled 2012-06-10 (×3): qty 1

## 2012-06-10 MED ORDER — ENOXAPARIN SODIUM 40 MG/0.4ML ~~LOC~~ SOLN
40.0000 mg | SUBCUTANEOUS | Status: DC
  Administered 2012-06-10 – 2012-06-12 (×3): 40 mg via SUBCUTANEOUS
  Filled 2012-06-10 (×4): qty 0.4

## 2012-06-10 MED ORDER — ENSURE COMPLETE PO LIQD
237.0000 mL | Freq: Two times a day (BID) | ORAL | Status: DC
  Administered 2012-06-10 – 2012-06-11 (×2): 237 mL via ORAL

## 2012-06-10 MED ORDER — LEVOBUNOLOL HCL 0.5 % OP SOLN
1.0000 [drp] | Freq: Two times a day (BID) | OPHTHALMIC | Status: DC
  Administered 2012-06-10 – 2012-06-12 (×5): 1 [drp] via OPHTHALMIC
  Filled 2012-06-10 (×2): qty 5

## 2012-06-10 MED ORDER — BIOTENE DRY MOUTH MT LIQD
15.0000 mL | Freq: Two times a day (BID) | OROMUCOSAL | Status: DC
  Administered 2012-06-10 – 2012-06-12 (×3): 15 mL via OROMUCOSAL

## 2012-06-10 MED ORDER — DEXTROSE-NACL 5-0.9 % IV SOLN
INTRAVENOUS | Status: DC
  Administered 2012-06-10 – 2012-06-12 (×4): via INTRAVENOUS

## 2012-06-10 MED ORDER — POLYETHYLENE GLYCOL 3350 17 G PO PACK
17.0000 g | PACK | Freq: Every day | ORAL | Status: DC
  Administered 2012-06-10 – 2012-06-12 (×3): 17 g via ORAL
  Filled 2012-06-10 (×3): qty 1

## 2012-06-10 NOTE — Progress Notes (Addendum)
[facility] PROGRESS NOTE  [name] UJW:[id number] DOB: 1923/01/18 DOA: 06/09/2012 PCP: No primary provider on file.  Brief narrative: [name] is an 79 y.o. female with a past medical history of type 2 diabetes, chronic knee pain, recent functional decline, and frequent falls was brought to the hospital for evaluation of weakness and mental status changes in the setting of having been treated with tramadol for several weeks. Upon initial evaluation in the ER, a head CT was negative for acute changes, with no evidence of underlying infection however she did have an elevated calcium level of 11.9.  Assessment/Plan: Principal Problem:   Acute encephalopathy -Suspect multifactorial with toxic and metabolic causes including polypharmacy and hypercalcemia. -Avoid sedating medications. -CT of the head showed chronic ischemic changes, but nothing acute. -Followup laboratory studies to evaluate underlying cause of hypercalcemia and continue to hydrate. -Send urine culture. Active Problems:   Dysphagia -Continue dysphagia 3 diet. Speech therapy evaluation requested.   Generalized weakness -PT evaluation requested.   Dyslipidemia -Continue Zocor. With muscular weakness, check CK rule out statin induced myopathy.   Hypertension -Suboptimal control of blood pressure on Cozaar. We'll continue to monitor trend and adjust medications if indicated.   Dehydration -Continue hydration.   Obstipation -Likely from hypercalcemia and dehydration. -Will give Dulcolax suppository today and maintain on daily MiraLAX.   Hepatitis C -Liver function studies stable.   Normocytic anemia -Likely anemia of chronic disease. Hemoglobin stable.   Hypercalcemia -Followup intact PTH and vitamin D studies. -If these are unrevealing, we'll need to evaluate for malignancy.   Type 2 diabetes mellitus -Oral hypoglycemics on hold. Continue insulin sensitive sliding scale.  Code Status: DNR Family  Communication: Granddaughter/grandson updated at bedside. Disposition Plan: Home versus rehabilitation depending on progress.   Medical Consultants:  None.  Other Consultants:  Physical therapy  Anti-infectives:  None.  HPI/Subjective: [name] is very lethargic. She opens her eyes to voice but does not interact much and this is a big change per her family. No complaints of pain. Appears disoriented.  Objective: Filed Vitals:   06/09/12 1724 06/10/12 0400 06/10/12 0500  BP: 158/56 171/156 130/70  Pulse: 58 63 62  Temp: 97.9 F (36.6 C)  98.1 F (36.7 C)  TempSrc: Oral  Oral  Resp: 16 22 20   SpO2: 94% 99% 97%    Intake/Output Summary (Last 24 hours) at 06/10/12 1226 Last data filed at 06/10/12 0600  Gross per 24 hour  Intake 146.67 ml  Output      0 ml  Net 146.67 ml    Exam: Gen:  NAD, lethargic Cardiovascular:  RRR, No M/R/G Respiratory:  Lungs diminished in the bases Gastrointestinal:  Abdomen soft, NT/ND, + BS Extremities:  No C/E/C  Data Reviewed: Basic Metabolic Panel:  Recent Labs Lab 06/09/12 1810 06/10/12 0650  NA 137  --   K 4.8  --   CL 100  --   CO2 29  --   GLUCOSE 116*  --   BUN 24*  --   CREATININE 1.06 0.85  CALCIUM 11.9*  --    GFR CrCl is unknown because there is no height on file for the current visit. Liver Function Tests:  Recent Labs Lab 06/09/12 1810  AST 37  ALT 38*  ALKPHOS 60  BILITOT 0.4  PROT 8.2  ALBUMIN 3.3*    Recent Labs Lab 06/10/12 0010  AMMONIA 23    CBC:  Recent Labs Lab 06/09/12 1810 06/10/12 0650  WBC  10.2 7.1  NEUTROABS 5.7  --   HGB 10.7* 10.3*  HCT 31.9* 29.6*  MCV 96.7 96.1  PLT 247 211   Cardiac Enzymes:  Recent Labs Lab 06/10/12 0010  TROPONINI <0.30   CBG:  Recent Labs Lab 06/10/12 0437 06/10/12 0732 06/10/12 1139  GLUCAP 89 98 112*   Thyroid function studies  Recent Labs  06/10/12 0650  TSH 2.486   Urinalysis    Component Value Date/Time    COLORURINE YELLOW 06/09/2012 2347   APPEARANCEUR CLOUDY* 06/09/2012 2347   LABSPEC 1.015 06/09/2012 2347   PHURINE 5.5 06/09/2012 2347   GLUCOSEU NEGATIVE 06/09/2012 2347   HGBUR NEGATIVE 06/09/2012 2347   BILIRUBINUR NEGATIVE 06/09/2012 2347   KETONESUR NEGATIVE 06/09/2012 2347   PROTEINUR NEGATIVE 06/09/2012 2347   UROBILINOGEN 1.0 06/09/2012 2347   NITRITE NEGATIVE 06/09/2012 2347   LEUKOCYTESUR SMALL* 06/09/2012 2347    Procedures and Diagnostic Studies: Ct Head Wo Contrast  06/09/2012  *RADIOLOGY REPORT*  Clinical Data: Weakness.  Multiple falls.  Not eating or drinking.  CT HEAD WITHOUT CONTRAST  Technique:  Contiguous axial images were obtained from the base of the skull through the vertex without contrast.  Comparison: 04/02/2008  Findings: Mild diffuse cerebral atrophy.  Mild ventricular dilatation consistent with central atrophy.  Low attenuation changes in the periventricular white matter consistent with small vessel ischemia.  No mass effect or midline shift.  No abnormal extra-axial fluid collections.  Gray-white matter junctions are distinct.  Basal cisterns are not effaced.  No depressed skull fractures.  Visualized paranasal sinuses and mastoid air cells are not opacified.  No significant changes since previous study.  IMPRESSION: No acute intracranial abnormalities.  Mild atrophy and small vessel ischemic changes.   Original Report Authenticated By: Burman Nieves, M.D.     Scheduled Meds: . antiseptic oral rinse  15 mL Mouth Rinse BID  . enoxaparin (LOVENOX) injection  40 mg Subcutaneous Q24H  . insulin aspart  0-9 Units Subcutaneous TID WC  . levobunolol  1 drop Both Eyes BID  . losartan  100 mg Oral Daily  . simvastatin  20 mg Oral q1800   Continuous Infusions: . dextrose 5 % and 0.9% NaCl 100 mL/hr at 06/10/12 0432    Time spent: 35 minutes   LOS: 1 day   [facility]  [facility] Pager [phone].  If 8PM-8AM, please contact night-coverage at [email or web address],  password [facility] 06/10/2012, 12:26 PM

## 2012-06-10 NOTE — H&P (Signed)
[facility] History and Physical  [name] ZOX:[id number] DOB: 1922/02/15    Chief Complaint: Family brought her in because of weakness.  HPI: [name] is an 79 y.o. female presents to the ER after seeing her PCP for weakness.  She wasn't able to give me any history.  I spoke with her daughter and was able to get the events of the last couple of weeks.  Apparently, she has chronic knee pain and had fallen before.  She was given Tramadol 50mg  to take for her pain.  Since then, she noted to have increased constipation requiring disimpaction.  Family also noted that she has been much more lethargic, unable to take much oral intake, and has been mostly in bed.  She didn't have any fever, chills, nausea, vomiting.  Evalutaion in the ER included a head CT which was negative. CXR was negative, no leukocytosis, and Hb of 10.7.  Her LFTs were unremarkable and renal fx tests were normal.  Her Calcium level however was elevated to 11.9. Hospitalist was asked to admit her for FTT.    Rewiew of Systems:  Constitutional: Negative for malaise, fever and chills. No significant weight loss or weight gain Eyes: Negative for eye pain, redness and discharge, diplopia, visual changes, or flashes of light. ENMT: Negative for ear pain, hoarseness, nasal congestion, sinus pressure and sore throat. No headaches; tinnitus, drooling, or problem swallowing. Cardiovascular: Negative for chest pain, palpitations, diaphoresis, dyspnea and peripheral edema. ; No orthopnea, PND Respiratory: Negative for cough, hemoptysis, wheezing and stridor. No pleuritic chestpain. Gastrointestinal: Negative for nausea, vomiting, diarrhea, abdominal pain, melena, blood in stool, hematemesis, jaundice and rectal bleeding.    Genitourinary: Negative for frequency, dysuria, incontinence,flank pain and hematuria; Musculoskeletal: Negative for back pain and neck pain. Negative for swelling and trauma.;  Skin: . Negative for  pruritus, rash, abrasions, bruising and skin lesion.; ulcerations Neuro: Negative for headache, lightheadedness and neck stiffness. Negative for weakness, altered level of consciousness , altered mental status, extremity weakness, burning feet, involuntary movement, seizure and syncope.  Psych: negative for anxiety, depression, insomnia, tearfulness, panic attacks, hallucinations, paranoia, suicidal or homicidal ideation    Past Medical History  Diagnosis Date  . Arthritis   . Hypertension   . Diabetes mellitus without complication   . Hepatitis C     History reviewed. No pertinent past surgical history.  Medications:  HOME MEDS: Prior to Admission medications   Medication Sig Start Date End Date Taking? Authorizing Provider  diclofenac (VOLTAREN) 50 MG EC tablet Take 50 mg by mouth 2 (two) times daily.   Yes Historical Provider, MD  levobunolol (BETAGAN) 0.5 % ophthalmic solution Place 1 drop into both eyes 2 (two) times daily.   Yes Historical Provider, MD  losartan (COZAAR) 100 MG tablet Take 100 mg by mouth daily.   Yes Historical Provider, MD  lovastatin (MEVACOR) 40 MG tablet Take 40 mg by mouth at bedtime.   Yes Historical Provider, MD  metFORMIN (GLUMETZA) 500 MG (MOD) 24 hr tablet Take 500-1,000 mg by mouth 2 (two) times daily with a meal. Takes 1 tablet(500mg ) every morning  And 2 tablets(1000mg ) every evening   Yes Historical Provider, MD     Allergies:  No Known Allergies  Social History:   reports that she has never smoked. She does not have any smokeless tobacco history on file. She reports that she does not drink alcohol or use illicit drugs.  Family History: No family history on file.   Physical  Exam: Filed Vitals:   06/09/12 1724  BP: 158/56  Pulse: 58  Temp: 97.9 F (36.6 C)  TempSrc: Oral  Resp: 16  SpO2: 94%   Blood pressure 158/56, pulse 58, temperature 97.9 F (36.6 C), temperature source Oral, resp. rate 16, SpO2 94.00%.  GEN:  Pleasant   patient lying in the stretcher in no acute distress; lethargic. PSYCH: does not appear anxious or depressed; affect is appropriate. HEENT: Mucous membranes pink and anicteric; PERRLA; EOM intact; no cervical lymphadenopathy nor thyromegaly or carotid bruit; no JVD; There were no stridor. Neck is very supple. Breasts:: Not examined CHEST WALL: No tenderness CHEST: Normal respiration, clear to auscultation bilaterally.  HEART: Regular rate and rhythm.  There are no murmur, rub, or gallops.   BACK: No kyphosis or scoliosis; no CVA tenderness ABDOMEN: soft and non-tender; no masses, no organomegaly, normal abdominal bowel sounds; no pannus; no intertriginous candida. There is no rebound and no distention. Rectal Exam: Not done EXTREMITIES: No bone or joint deformity; age-appropriate arthropathy of the hands and knees; no edema; no ulcerations.  There is no calf tenderness. Genitalia: not examined PULSES: 2+ and symmetric SKIN: Normal hydration no rash or ulceration CNS she is lethargic and complete neurological exam was quite suboptimal.  She has fluent speech with facial symmetry with grimace.  She was able to move all extremities.  Labs on Admission:  Basic Metabolic Panel:  Recent Labs Lab 06/09/12 1810  NA 137  K 4.8  CL 100  CO2 29  GLUCOSE 116*  BUN 24*  CREATININE 1.06  CALCIUM 11.9*   Liver Function Tests:  Recent Labs Lab 06/09/12 1810  AST 37  ALT 38*  ALKPHOS 60  BILITOT 0.4  PROT 8.2  ALBUMIN 3.3*   No results found for this basename: LIPASE, AMYLASE,  in the last 168 hours  Recent Labs Lab 06/10/12 0010  AMMONIA 23   CBC:  Recent Labs Lab 06/09/12 1810  WBC 10.2  NEUTROABS 5.7  HGB 10.7*  HCT 31.9*  MCV 96.7  PLT 247   Cardiac Enzymes:  Recent Labs Lab 06/10/12 0010  TROPONINI <0.30    CBG: No results found for this basename: GLUCAP,  in the last 168 hours   Radiological Exams on Admission: Ct Head Wo Contrast  06/09/2012   *RADIOLOGY REPORT*  Clinical Data: Weakness.  Multiple falls.  Not eating or drinking.  CT HEAD WITHOUT CONTRAST  Technique:  Contiguous axial images were obtained from the base of the skull through the vertex without contrast.  Comparison: 04/02/2008  Findings: Mild diffuse cerebral atrophy.  Mild ventricular dilatation consistent with central atrophy.  Low attenuation changes in the periventricular white matter consistent with small vessel ischemia.  No mass effect or midline shift.  No abnormal extra-axial fluid collections.  Gray-white matter junctions are distinct.  Basal cisterns are not effaced.  No depressed skull fractures.  Visualized paranasal sinuses and mastoid air cells are not opacified.  No significant changes since previous study.  IMPRESSION: No acute intracranial abnormalities.  Mild atrophy and small vessel ischemic changes.   Original Report Authenticated By: Burman Nieves, M.D.     Assessment/Plan Present on Admission:  . Dehydration . Obstipation . Hepatitis C Altered mental Status +++ Drug induced lethargy +++ Dehydration +++ Hypercalcemia  PLAN:  I suspect the ultram has caused her constipation and lethargy.  She is also dehydrated because of her lethargy.  Will give IVF and observe for improvement.  Her calcium is elevated, and I  will get PTH and Vit D. I discussed with her daughter, and though I am not absolutely sure, I believe the Tramadol has made over too sedated.  I was able to discuss code status and confirmed that she is a DNR.  Will honor her wishes. I will admit her to [facility] service.  Thank you for allowing me to partake in the care of your patient.  Other plans as per orders.  Code Status: DNR.  Houston Siren, MD. [facility] Pager [phone] 7pm to 7am.  06/10/2012, 3:45 AM

## 2012-06-10 NOTE — Progress Notes (Signed)
INITIAL NUTRITION ASSESSMENT  DOCUMENTATION CODES Per approved criteria  -Not Applicable   INTERVENTION: - Ensure Complete BID - Assisted pt with ordering meal - Recommend nursing obtain weight on pt  - Will continue to monitor  NUTRITION DIAGNOSIS: Inadequate oral intake related to poor appetite as evidenced by family report.   Goal: Pt to consume >90% of meals/supplements  Monitor:  Weights, labs, intake  Reason for Assessment: Nutrition risk   79 y.o. female  Admitting Dx: Acute encephalopathy  ASSESSMENT: Pt admitted with general decline in the past 2 months with lethargy, decreased appetite and intake, and decreased urine output and constipation. Family reports pt falling frequently and pt has been bedbound for 2 days PTA.  Met with pt and family who report pt with poor intake for 2 weeks PTA. They state that family does not have problems chewing or swallowing but does well with soft foods. Family member reports they were planning on starting her on Ensure.   Height: 5' 4'' per family  Weight: 120 lb per family   Ideal Body Weight: 120 lb  % Ideal Body Weight: 100   Usual Body Weight: Family unsure   BMI:  20.7 kg/(m^2)  Estimated Nutritional Needs: Kcal: 1400-1650 Protein: 55-65g Fluid: 1.4-1.6L/day  Skin: Intact   Diet Order: Dysphagia 3, thin  EDUCATION NEEDS: -No education needs identified at this time   Intake/Output Summary (Last 24 hours) at 06/10/12 1228 Last data filed at 06/10/12 0600  Gross per 24 hour  Intake 146.67 ml  Output      0 ml  Net 146.67 ml    Last BM: 4/24  Labs:   Recent Labs Lab 06/09/12 1810 06/10/12 0650  NA 137  --   K 4.8  --   CL 100  --   CO2 29  --   BUN 24*  --   CREATININE 1.06 0.85  CALCIUM 11.9*  --   GLUCOSE 116*  --     CBG (last 3)   Recent Labs  06/10/12 0437 06/10/12 0732 06/10/12 1139  GLUCAP 89 98 112*    Scheduled Meds: . antiseptic oral rinse  15 mL Mouth Rinse BID  .  enoxaparin (LOVENOX) injection  40 mg Subcutaneous Q24H  . insulin aspart  0-9 Units Subcutaneous TID WC  . levobunolol  1 drop Both Eyes BID  . losartan  100 mg Oral Daily  . simvastatin  20 mg Oral q1800    Continuous Infusions: . dextrose 5 % and 0.9% NaCl 100 mL/hr at 06/10/12 1610    Past Medical History  Diagnosis Date  . Arthritis   . Hypertension   . Diabetes mellitus without complication   . Hepatitis C     History reviewed. No pertinent past surgical history.   Levon Hedger MS, RD, LDN [phone] Pager [phone] After Hours Pager

## 2012-06-10 NOTE — Progress Notes (Signed)
   CARE MANAGEMENT NOTE 06/10/2012  Patient:  [name], [name]   Account Number:  [id number]  Date Initiated:  06/10/2012  Documentation initiated by:  Jiles Crocker  Subjective/Objective Assessment:   ADMITTED WITH FAILURE TO THRIVE, HYPERCALCEMIA; ACUTE ENCEPHALOPATHY     Action/Plan:   PCP IS DR Elisabeth Most  LIVES AT HOME WITH FAMILY MEMBERS; CM FOLLOWING FOR DCP   Anticipated DC Date:  06/17/2012   Anticipated DC Plan:  SKILLED NURSING FACILITY      DC Planning Services  CM consult         Status of service:  In process, will continue to follow Medicare Important Message given?  NA - LOS <3 / Initial given by admissions (If response is "NO", the following Medicare IM given date fields will be blank) Per UR Regulation:  Reviewed for med. necessity/level of care/duration of stay Comments:  06/10/2012- B Devinne Epstein RN,BSN,MHA

## 2012-06-10 NOTE — Evaluation (Signed)
Physical Therapy Evaluation Patient Details Name: [name] MRN: [id number] DOB: 1922-05-08 Today's Date: 06/10/2012 Time: 8295-6213 PT Time Calculation (min): 33 min  PT Assessment / Plan / Recommendation Clinical Impression  Pt presents with decreased I with functional mobility and increasing number of falls at home per daughter.  Pt lethargic at eval, but did ambulate short distance and transfer to [facility].  Pt's daughter would like to take her home, but open to rehab.  Based on findings, PT recommends SNF.    PT Assessment  Patient needs continued PT services    Follow Up Recommendations  SNF    Does the patient have the potential to tolerate intense rehabilitation      Barriers to Discharge        Equipment Recommendations  None recommended by PT    Recommendations for Other Services     Frequency Min 3X/week    Precautions / Restrictions Restrictions Weight Bearing Restrictions: No   Pertinent Vitals/Pain Some grimacing with WB with gait      Mobility  Transfers Transfers: Sit to Stand Sit to Stand: 1: +2 Total assist Sit to Stand: Patient Percentage: 70% Ambulation/Gait Ambulation/Gait Assistance: 1: +2 Total assist Ambulation/Gait: Patient Percentage: 70% Ambulation Distance (Feet): 6 Feet Assistive device: Rolling walker Ambulation/Gait Assistance Details: 2nd person A due to decreased safety/lethargy Gait Pattern: Trunk flexed Gait velocity: slow    Exercises     PT Diagnosis: Generalized weakness;Difficulty walking  PT Problem List: Decreased strength;Decreased range of motion;Decreased activity tolerance;Decreased mobility PT Treatment Interventions: Gait training;Functional mobility training;Therapeutic activities;Therapeutic exercise;Balance training   PT Goals Acute Rehab PT Goals PT Goal Formulation: With patient/family Time For Goal Achievement: 06/17/12 Potential to Achieve Goals: Good Pt will go Supine/Side to Sit: with min assist PT  Goal: Supine/Side to Sit - Progress: Goal set today Pt will go Sit to Stand: with supervision PT Goal: Sit to Stand - Progress: Goal set today Pt will Transfer Bed to Chair/Chair to Bed: with min assist PT Transfer Goal: Bed to Chair/Chair to Bed - Progress: Goal set today Pt will Ambulate: 16 - 50 feet;with min assist;with rolling walker  Visit Information  Last PT Received On: 06/10/12 Assistance Needed: +2    Subjective Data  Subjective: "I don't know if i walked" Patient Stated Goal: Daughter (via phone) would like to take mother home but is open to more rehab if that is what pt needs.   Prior Functioning  Home Living Lives With: Daughter Available Help at Discharge: Family Home Access: Stairs to enter Entrance Stairs-Number of Steps: 2 Home Layout: Able to live on main level with bedroom/bathroom Home Adaptive Equipment: Walker - rolling Prior Function Level of Independence: Independent with assistive device(s);Needs assistance (recent increasein level of A required with falls) Needs Assistance: Gait;Transfers Gait Assistance: Pt has only begun using RW recently due to recent falls. Vocation: Retired Musician: No difficulties    Copywriter, advertising Arousal/Alertness: Youth worker During Therapy: WFL for tasks assessed/performed Overall Cognitive Status: Difficult to assess Difficult to assess due to: Level of arousal    Extremity/Trunk Assessment Right Lower Extremity Assessment RLE ROM/Strength/Tone: [facility] for tasks assessed Left Lower Extremity Assessment LLE ROM/Strength/Tone: WFL for tasks assessed   Balance    End of Session PT - End of Session Equipment Utilized During Treatment: Gait belt Activity Tolerance: Patient limited by fatigue Patient left: in chair;with family/visitor present Nurse Communication: Mobility status  GP     Rosemarie Galvis LUBECK 06/10/2012, 11:44 AM

## 2012-06-11 DIAGNOSIS — E785 Hyperlipidemia, unspecified: Secondary | ICD-10-CM

## 2012-06-11 DIAGNOSIS — G9340 Encephalopathy, unspecified: Secondary | ICD-10-CM

## 2012-06-11 LAB — GLUCOSE, CAPILLARY
Glucose-Capillary: 156 mg/dL — ABNORMAL HIGH (ref 70–99)
Glucose-Capillary: 146 mg/dL — ABNORMAL HIGH (ref 70–99)

## 2012-06-11 LAB — BASIC METABOLIC PANEL
Calcium: 9.8 mg/dL (ref 8.4–10.5)
GFR calc Af Amer: >90 mL/min (ref >90)
GFR calc non Af Amer: 79 mL/min — ABNORMAL LOW (ref >90)
Potassium: 3.8 mEq/L (ref 3.5–5.1)
Sodium: 139 mEq/L (ref 135–145)

## 2012-06-11 MED ORDER — METFORMIN HCL ER 500 MG PO TB24
500.0000 mg | ORAL_TABLET | Freq: Every day | ORAL | Status: DC
  Administered 2012-06-12: 500 mg via ORAL
  Filled 2012-06-11 (×2): qty 1

## 2012-06-11 MED ORDER — METFORMIN HCL ER 500 MG PO TB24: 500.0000 mg | ORAL_TABLET | Freq: Two times a day (BID) | ORAL | Status: DC

## 2012-06-11 MED ORDER — METFORMIN HCL ER 500 MG PO TB24
1000.0000 mg | ORAL_TABLET | Freq: Every day | ORAL | Status: DC
  Filled 2012-06-11: qty 2

## 2012-06-11 NOTE — Progress Notes (Signed)
CSW received referral for New SNF placement. CSW attempted to meet with pt at bedside, but pt sleeping at this time. CSW contacted pt daughter via telephone and left voice message. CSW to await return phone call and complete full psychosocial assessment at that time.    Jacklynn Lewis, MSW, LCSWA  Clinical Social Work [phone]

## 2012-06-11 NOTE — Evaluation (Signed)
Clinical/Bedside Swallow Evaluation Patient Details  Name: [name] MRN: [id number] Date of Birth: Oct 24, 1922  Today's Date: 06/11/2012 Time: 1125-1159 SLP Time Calculation (min): 34 min  Past Medical History:  Past Medical History  Diagnosis Date  . Arthritis   . Hypertension   . Diabetes mellitus without complication   . Hepatitis C    Past Surgical History: History reviewed. No pertinent past surgical history. HPI:  pt is a 79 yo female adm to [facility] with AMS, encephalopathy, FTT, decreased output, constipation, frequent falls x2 months and bed bound x2 days.  Pt CT head negative for acute changes.  Lungs decreased at bases but clear.  Order was received for bedside swallow evaluation.     Assessment / Plan / Recommendation Clinical Impression  Pt presents with mild oral dysphagia with delay in mastication, oral transit mild oral stasis.  Mild delay in swallow initiation noted but no s/s of aspiration noted.  Rec continue soft/thin diet due to pt's mild oral difficulties and dentition.  Pt states she does not eat because she does not have an appetite and admits her children are good cooks and prepare meals for her.   Pt needs set up assistance as she stated to SLP she didn't desire po intake but ate lunch with set up assist.   SLP educated pt to aspiration precautions.  SLP to sign off, please reorder if desire.     Aspiration Risk  Moderate    Diet Recommendation Dysphagia 3 (Mechanical Soft);Thin liquid   Liquid Administration via: Cup;Straw Medication Administration:  (as tolerated) Supervision: Patient able to self feed Compensations: Slow rate;Small sips/bites Postural Changes and/or Swallow Maneuvers: Seated upright 90 degrees;Upright 30-60 min after meal    Other  Recommendations Oral Care Recommendations: Oral care QID   Follow Up Recommendations       Frequency and Duration        Pertinent Vitals/Pain Afebrile, decreased    SLP Swallow Goals    n/a  Swallow Study Prior Functional Status   Resides with her children per pt, meals are prepared for her by daughter per pt    General HPI: pt is a 79 yo female adm to [facility] with AMS, encephalopathy, FTT, decreased output, constipation, frequent falls x2 months and bed bound x2 days.  Pt CT head negative for acute changes.  Lungs decreased at bases but clear.  Order was received for bedside swallow evaluation.   Type of Study: Bedside swallow evaluation Previous Swallow Assessment: mbs 11/2010 functional oropharyngeal swallow without asp or penetration, appearance of consistent throracic level stasis with solids requiring thin to help clear Diet Prior to this Study: Dysphagia 3 (soft);Thin liquids Temperature Spikes Noted: No Respiratory Status: Room air History of Recent Intubation: No Behavior/Cognition: Alert;Cooperative;Pleasant mood Oral Cavity - Dentition: Dentures, bottom;Dentures, top Self-Feeding Abilities: Able to feed self Patient Positioning: Upright in bed Baseline Vocal Quality: Clear Volitional Cough: Strong Volitional Swallow: Able to elicit    Oral/Motor/Sensory Function Overall Oral Motor/Sensory Function: Appears within functional limits for tasks assessed (coating on tongue-whitish,viscous secretions expectorated )   Ice Chips Ice chips: Not tested   Thin Liquid Thin Liquid: Impaired Presentation: Cup;Self Fed;Straw Pharyngeal  Phase Impairments: Suspected delayed Swallow Other Comments: mild delay in initiation    Nectar Thick Nectar Thick Liquid: Not tested   Honey Thick Honey Thick Liquid: Not tested   Puree Puree: Not tested   Solid   GO    Solid: Impaired Presentation: Self Fed;Spoon Oral Phase Functional Implications:  Oral residue (minimal amount on left, cleared with liquid intake)       Donavan Burnet, MS [facility] SLP [phone]

## 2012-06-11 NOTE — Progress Notes (Signed)
Physical Therapy Treatment Patient Details Name: [name] MRN: [id number] DOB: Nov 17, 1922 Today's Date: 06/11/2012 Time: 4132-4401 PT Time Calculation (min): 16 min  PT Assessment / Plan / Recommendation Comments on Treatment Session  Pt ambulated short distance however limited due to fatigue and weakness.  Pt's daughter and granddaughter present during ambulation and state pt will d/c home as they do not wish for her to go to SNF.      Follow Up Recommendations  SNF;Other (comment) (however family declines so HHPT)     Does the patient have the potential to tolerate intense rehabilitation     Barriers to Discharge        Equipment Recommendations  None recommended by PT    Recommendations for Other Services    Frequency     Plan Discharge plan remains appropriate;Frequency remains appropriate    Precautions / Restrictions Precautions Precautions: Fall   Pertinent Vitals/Pain n/a    Mobility  Bed Mobility Bed Mobility: Supine to Sit;Sit to Supine Supine to Sit: 3: Mod assist Sit to Supine: 5: Supervision Details for Bed Mobility Assistance: assist for trunk upright, verbal cues for technique Transfers Transfers: Sit to Stand;Stand to Sit Sit to Stand: 3: Mod assist;With upper extremity assist;From bed;From chair/3-in-1 Stand to Sit: 4: Min assist;With upper extremity assist;To bed;To chair/3-in-1 Details for Transfer Assistance: increased assist to rise and control descent with multimodal cues for safe technique Ambulation/Gait Ambulation/Gait Assistance: 3: Mod assist Ambulation Distance (Feet): 12 Feet (x2) Assistive device: Rolling walker Ambulation/Gait Assistance Details: assist required for weakness and balance, pt with increased LE fatigue requiring seated rest break then able to ambulate back to bed Gait Pattern: Step-through pattern;Shuffle;Decreased stride length;Trunk flexed Gait velocity: decreased General Gait Details: max verbal cues for safe use  of RW and posture    Exercises     PT Diagnosis:    PT Problem List:   PT Treatment Interventions:     PT Goals Acute Rehab PT Goals PT Goal: Supine/Side to Sit - Progress: Progressing toward goal PT Goal: Sit to Stand - Progress: Progressing toward goal PT Goal: Ambulate - Progress: Progressing toward goal  Visit Information  Last PT Received On: 06/11/12 Assistance Needed: +2    Subjective Data  Subjective: My legs are tired.  (with ambulation) Patient Stated Goal: daughter in room wants pt to return home   Cognition  Cognition Arousal/Alertness: Awake/alert Behavior During Therapy: WFL for tasks assessed/performed Area of Impairment: Following commands Following Commands: Follows one step commands inconsistently;Follows one step commands with increased time General Comments: pt slow to process and required increased cues    Balance     End of Session PT - End of Session Equipment Utilized During Treatment: Gait belt Activity Tolerance: Patient limited by fatigue Patient left: with call bell/phone within reach;in bed;with family/visitor present   GP     Kerrie Latour,KATHrine E 06/11/2012, 4:36 PM Zenovia Jarred, PT, DPT 06/11/2012 Pager: [phone]

## 2012-06-11 NOTE — Progress Notes (Signed)
Patient evaluated for long-term disease management services with [facility] Care Management Program a benefit of her [employer]. However, it appears dc plan could be SNF. Will continue to follow for [facility] Care Management needs if discharge plans change.  Raiford Noble, MSN-Ed, RN,BSN, [facility], [phone]

## 2012-06-11 NOTE — Progress Notes (Signed)
[facility] PROGRESS NOTE  [name] RUE:[id number] DOB: [birth year] DOA: 06/09/2012 PCP: No primary provider on file.  Brief narrative: 79 y.o. female with a past medical history of type 2 diabetes, chronic knee pain, recurrent falls who presented to [facility] ED 06/09/2012 with altered mental status and generalized weakness. Evaluation in ED included CT head which was negative for acute intracranial findings. Patient was found to have calcium level elevated at 11.9.  Assessment/Plan:   Principal Problem:  Acute encephalopathy  - Likely toxic and metabolic encephalopathy as well as hypercalcemia - Patient was given IV fluids - Appreciate physical therapy evaluation, recommendation for skilled nursing facility or 24-hour supervision  Active Problems:  Hypercalcemia - PTH still pending; vitamin D within normal limits - Calcium resolved to normal limit with IV fluids Dysphagia  - Tolerates a dysphagia 3 diet Generalized weakness  - Either discharge with home health physical therapy or to skilled nursing facility. Dyslipidemia  - Continue simvastatin  Hypertension  - Continue Cozaar 100 mg daily Obstipation  - Secondary to hypercalcemia - Dulcolax suppository as needed and MiraLAX daily Anemia of chronic disease - Hemoglobin stable  Type 2 diabetes mellitus  - On insulin sliding scale. - Start metformin per home regimen   Code Status: DNR  Family Communication: Family not at the bedside Disposition Plan: home when stable  Medical Consultants:  None. Other Consultants:  Physical therapy Anti-infectives:  None.  Manson Passey, MD  [facility] Pager [phone]  If 7PM-7AM, please contact night-coverage [email or web address] Password [facility] 06/11/2012, 4:58 PM   LOS: 2 days    HPI/Subjective: No acute overnight events  Objective: Filed Vitals:   06/10/12 2100 06/11/12 0500 06/11/12 0641 06/11/12 1350  BP: 168/60 169/71  165/62  Pulse: 65 63  63  Temp: 98.8 F (37.1 C)  97.3 F (36.3 C)  98.8 F (37.1 C)  TempSrc: Oral Oral  Oral  Resp: 18 20  16   Height:      Weight:   53.6 kg (118 lb 2.7 oz)   SpO2: 99% 100%  100%    Intake/Output Summary (Last 24 hours) at 06/11/12 1658 Last data filed at 06/11/12 0650  Gross per 24 hour  Intake 1468.33 ml  Output    100 ml  Net 1368.33 ml    Exam:   General:  Pt is alert,  not in acute distress  Cardiovascular: Regular rate and rhythm, S1/S2, SEM appreciated  Respiratory: Clear to auscultation bilaterally, no wheezing, no crackles, no rhonchi  Abdomen: Soft, non tender, non distended, bowel sounds present, no guarding  Extremities: No edema, pulses DP and PT palpable bilaterally  Neuro: Grossly nonfocal  Data Reviewed: Basic Metabolic Panel:  Recent Labs Lab 06/09/12 1810 06/10/12 0650 06/11/12 0405  NA 137  --  139  K 4.8  --  3.8  CL 100  --  106  CO2 29  --  27  GLUCOSE 116*  --  157*  BUN 24*  --  13  CREATININE 1.06 0.85 0.60  CALCIUM 11.9*  --  9.8   Liver Function Tests:  Recent Labs Lab 06/09/12 1810  AST 37  ALT 38*  ALKPHOS 60  BILITOT 0.4  PROT 8.2  ALBUMIN 3.3*   No results found for this basename: LIPASE, AMYLASE,  in the last 168 hours  Recent Labs Lab 06/10/12 0010  AMMONIA 23   CBC:  Recent Labs Lab 06/09/12 1810 06/10/12 0650  WBC 10.2 7.1  NEUTROABS 5.7  --  HGB 10.7* 10.3*  HCT 31.9* 29.6*  MCV 96.7 96.1  PLT 247 211   Cardiac Enzymes:  Recent Labs Lab 06/10/12 0010 06/10/12 0650  CKTOTAL  --  23  TROPONINI <0.30  --    BNP: No components found with this basename: POCBNP,  CBG:  Recent Labs Lab 06/10/12 1655 06/10/12 2158 06/11/12 0732 06/11/12 1135 06/11/12 1634  GLUCAP 142* 206* 156* 129* 146*    Studies: Ct Head Wo Contrast 06/09/2012  * IMPRESSION: No acute intracranial abnormalities.  Mild atrophy and small vessel ischemic changes.   Original Report Authenticated By: Burman Nieves, M.D.     Scheduled Meds: .  enoxaparin (LOVENOX)   40 mg Subcutaneous Q24H  . insulin aspart  0-9 Units Subcutaneous TID WC  . levobunolol  1 drop Both Eyes BID  . losartan  100 mg Oral Daily  . polyethylene glycol  17 g Oral Daily  . simvastatin  20 mg Oral q1800   Continuous Infusions: . dextrose 5 % and 0.9% NaCl 100 mL/hr at 06/11/12 0441

## 2012-06-11 NOTE — Progress Notes (Signed)
Clinical Social Work Department BRIEF PSYCHOSOCIAL ASSESSMENT 06/11/2012  Patient:  [name], [name]     Account Number:  [id number]     Admit date:  06/09/2012  Clinical Social Worker:  Jacelyn Grip  Date/Time:  06/11/2012 12:30 PM  Referred by:  Physician  Date Referred:  06/11/2012 Referred for  SNF Placement   Other Referral:   Interview type:  Family Other interview type:    PSYCHOSOCIAL DATA Living Status:  FAMILY Admitted from facility:   Level of care:   Primary support name:  [name]/daughter/[phone] Primary support relationship to patient:  CHILD, ADULT Degree of support available:   strong    CURRENT CONCERNS Current Concerns  Post-Acute Placement   Other Concerns:    SOCIAL WORK ASSESSMENT / PLAN CSW received referral for SNF placement.    CSW received return phone call from pt daughter. Pt daughter reports that pt daughters live with pt and care for pt at home. CSW discussed PT evaluation and discussed short term rehab options. Pt daughter states that she feels that pt would not be happy in SNF and they would like for pt to return home with home health services. CSW notified RNCM to assist with home care needs.    No further social work needs identified at this time.    CSW signing off.   Assessment/plan status:  No Further Intervention Required Other assessment/ plan:   Information/referral to community resources:   Referral to [facility]    PATIENT'S/FAMILY'S RESPONSE TO PLAN OF CARE: Pt was sleeping at this time. Pt family appears supportive and actively involved in pt care and have been caring for pt at home. Pt daughter does not feel that SNF will be an option that pt will be happy with and wants pt to return home.    [name], MSW, LCSWA  Clinical Social Work [phone]

## 2012-06-12 LAB — URINE CULTURE: Colony Count: 45000

## 2012-06-12 LAB — GLUCOSE, CAPILLARY: Glucose-Capillary: 143 mg/dL — ABNORMAL HIGH (ref 70–99)

## 2012-06-12 MED ORDER — POLYETHYLENE GLYCOL 3350 17 G PO PACK: 17.0000 g | PACK | Freq: Every day | ORAL | Status: DC

## 2012-06-12 MED ORDER — ENSURE COMPLETE PO LIQD: 237.0000 mL | Freq: Two times a day (BID) | ORAL | Status: DC

## 2012-06-12 NOTE — Progress Notes (Signed)
Went to bedside to speak with patient about [facility] Care Management services. However, she appeared a little confused. Called daughter, Gaspar Cola at [phone] and left vm to request call back. Left brochure in patient's room. Appears discharge plan is now home not SNF. Will attempt to reach daughter to see if they are interested in [facility] Care Management following.  Raiford Noble, MSN- Ed, RN,BSN- [facility] [phone]

## 2012-06-12 NOTE — Plan of Care (Signed)
Problem: Discharge Progression Outcomes Goal: Activity appropriate for discharge plan Outcome: Not Met (add Reason) Activity level not adequate for discharge home.  Patient's family aware of this, and willing to take on responsibility.  Philomena Doheny RN

## 2012-06-12 NOTE — Discharge Summary (Signed)
Physician Discharge Summary  [name] NWG:[id number] DOB: [birth year] DOA: 06/09/2012  PCP: No primary provider on file.  Admit date: 06/09/2012 Discharge date: 06/12/2012  Recommendations for Outpatient Follow-up:  1. Followup with primary care provider in one to 2 weeks after discharge. These followup parathyroid hormone results at that time.  Discharge Diagnoses:  Principal Problem:   Acute encephalopathy Active Problems:   Dehydration   Obstipation   Hepatitis C   Normocytic anemia   Hypercalcemia   Type 2 diabetes mellitus   Dyslipidemia   Unspecified essential hypertension  Discharge Condition: Medically stable for discharge home today. Spoke with the family who declined skilled nursing facility placement at this time. Order placed for home health physical therapy, occupational therapy, RN, home health aide and social work. Order placed for hoyer lift.  Diet recommendation: as tolerated  History of present illness:  79 y.o. female with a past medical history of type 2 diabetes, chronic knee pain, recurrent falls who presented to [facility] ED 06/09/2012 with altered mental status and generalized weakness. Evaluation in ED included CT head which was negative for acute intracranial findings. Patient was found to have calcium level elevated at 11.9.   Assessment/Plan:   Principal Problem:  Acute encephalopathy  - Likely toxic and metabolic encephalopathy as well as hypercalcemia. - Ammonia level within normal limits. CT head with no acute intracranial findings. - Patient was given IV fluids and her mental status has improved since admission.  Active Problems:  Hypercalcemia  - PTH still pending and it can be followed up with primary care provider.; vitamin D within normal limits  - Calcium resolved to normal limit with IV fluids  Dysphagia  - Tolerates a dysphagia 3 diet  Generalized weakness  - Patient's family has declined skilled nursing facility placement at this  time. Patient will be going home today. Dyslipidemia  - Continue simvastatin  Hypertension  - Continue Cozaar 100 mg daily. I have informed the patient's daughter to check blood pressure and if systolic blood pressure is less than 122 either keep half dosage or skip the medication for today. Obstipation  - Secondary to hypercalcemia  - Patient had bowel movement since admission. Continue MiraLAX Anemia of chronic disease  - Hemoglobin stable  Type 2 diabetes mellitus  - CBGs in past 24 hours are as follows: 146, 197, 143 - Continue metformin per home regimen  Code Status: DNR  Family Communication: Family not at the bedside. Updated the patient's daughter over the phone at 7- [phone] [facility] Consultants:  None. Other Consultants:  Physical therapy Anti-infectives:  None.   Manson Passey, MD  [facility]  Pager [phone]   Discharge Exam: Filed Vitals:   06/12/12 0645  BP: 150/64  Pulse: 61  Temp: 98.7 F (37.1 C)  Resp: 20   Filed Vitals:   06/11/12 0641 06/11/12 1350 06/11/12 2055 06/12/12 0645  BP:  165/62 151/63 150/64  Pulse:  63 62 61  Temp:  98.8 F (37.1 C) 98.8 F (37.1 C) 98.7 F (37.1 C)  TempSrc:  Oral Oral Oral  Resp:  16 20 20   Height:      Weight: 53.6 kg (118 lb 2.7 oz)     SpO2:  100% 100% 95%    General: Pt is alert, follows commands appropriately, not in acute distress Cardiovascular: Regular rate and rhythm, S1/S2  appreciated Respiratory: Clear to auscultation bilaterally, no wheezing, no crackles, no rhonchi Abdominal: Soft, non tender, non distended, bowel sounds +, no  guarding Extremities: no edema, no cyanosis, pulses palpable bilaterally DP and PT Neuro: Grossly nonfocal  Discharge Instructions  Discharge Orders   Future Orders Complete By Expires     Call MD for:  difficulty breathing, headache or visual disturbances  As directed     Call MD for:  persistant dizziness or light-headedness  As directed     Call MD for:   persistant nausea and vomiting  As directed     Call MD for:  severe uncontrolled pain  As directed     Diet - low sodium heart healthy  As directed     Discharge instructions  As directed     Comments:      Please do not take blood pressure medication (Losartan) if systolic BP < 120    Increase activity slowly  As directed         Medication List    TAKE these medications       diclofenac 50 MG EC tablet  Commonly known as:  VOLTAREN  Take 50 mg by mouth 2 (two) times daily.     feeding supplement Liqd  Take 237 mLs by mouth 2 (two) times daily between meals.     levobunolol 0.5 % ophthalmic solution  Commonly known as:  BETAGAN  Place 1 drop into both eyes 2 (two) times daily.     losartan 100 MG tablet  Commonly known as:  COZAAR  Take 100 mg by mouth daily.     lovastatin 40 MG tablet  Commonly known as:  MEVACOR  Take 40 mg by mouth at bedtime.     metFORMIN 500 MG (MOD) 24 hr tablet  Commonly known as:  GLUMETZA  Take 500-1,000 mg by mouth 2 (two) times daily with a meal. Takes 1 tablet(500mg ) every morning  And 2 tablets(1000mg ) every evening     polyethylene glycol packet  Commonly known as:  MIRALAX / GLYCOLAX  Take 17 g by mouth daily.          The results of significant diagnostics from this hospitalization (including imaging, microbiology, ancillary and laboratory) are listed below for reference.    Significant Diagnostic Studies: Ct Head Wo Contrast  06/09/2012  *RADIOLOGY REPORT*  Clinical Data: Weakness.  Multiple falls.  Not eating or drinking.  CT HEAD WITHOUT CONTRAST  Technique:  Contiguous axial images were obtained from the base of the skull through the vertex without contrast.  Comparison: 04/02/2008  Findings: Mild diffuse cerebral atrophy.  Mild ventricular dilatation consistent with central atrophy.  Low attenuation changes in the periventricular white matter consistent with small vessel ischemia.  No mass effect or midline shift.  No abnormal  extra-axial fluid collections.  Gray-white matter junctions are distinct.  Basal cisterns are not effaced.  No depressed skull fractures.  Visualized paranasal sinuses and mastoid air cells are not opacified.  No significant changes since previous study.  IMPRESSION: No acute intracranial abnormalities.  Mild atrophy and small vessel ischemic changes.   Original Report Authenticated By: Burman Nieves, M.D.     Microbiology: Recent Results (from the past 240 hour(s))  URINE CULTURE     Status: None   Collection Time    06/10/12  3:51 PM      Result Value Range Status   Specimen Description URINE, CLEAN CATCH   Final   Special Requests Normal   Final   Culture  Setup Time 06/10/2012 22:38   Final   Colony Count 45,000 COLONIES/ML   Final  Culture GRAM NEGATIVE RODS   Final   Report Status PENDING   Incomplete     Labs: Basic Metabolic Panel:  Recent Labs Lab 06/09/12 1810 06/10/12 0650 06/11/12 0405  NA 137  --  139  K 4.8  --  3.8  CL 100  --  106  CO2 29  --  27  GLUCOSE 116*  --  157*  BUN 24*  --  13  CREATININE 1.06 0.85 0.60  CALCIUM 11.9*  --  9.8   Liver Function Tests:  Recent Labs Lab 06/09/12 1810  AST 37  ALT 38*  ALKPHOS 60  BILITOT 0.4  PROT 8.2  ALBUMIN 3.3*   No results found for this basename: LIPASE, AMYLASE,  in the last 168 hours  Recent Labs Lab 06/10/12 0010  AMMONIA 23   CBC:  Recent Labs Lab 06/09/12 1810 06/10/12 0650  WBC 10.2 7.1  NEUTROABS 5.7  --   HGB 10.7* 10.3*  HCT 31.9* 29.6*  MCV 96.7 96.1  PLT 247 211   Cardiac Enzymes:  Recent Labs Lab 06/10/12 0010 06/10/12 0650  CKTOTAL  --  23  TROPONINI <0.30  --    BNP: BNP (last 3 results) No results found for this basename: PROBNP,  in the last 8760 hours CBG:  Recent Labs Lab 06/11/12 0732 06/11/12 1135 06/11/12 1634 06/11/12 2051 06/12/12 0753  GLUCAP 156* 129* 146* 197* 143*    Time coordinating discharge: Over 30 minutes  Signed:  Manson Passey, MD  [facility]  06/12/2012, 11:09 AM  Pager #: [phone]

## 2012-06-12 NOTE — Plan of Care (Signed)
Problem: Discharge Progression Outcomes Goal: Discharge plan in place and appropriate Outcome: Adequate for Discharge Family wishes to take patient home.  Patient requires 24/7 care, with 2 person assist with ambulation.  Family aware of level of care and wishes to proceed with discharge home.

## 2012-06-12 NOTE — Progress Notes (Signed)
Physical Therapy Treatment Patient Details Name: [name] MRN: [id number] DOB: 1922-03-28 Today's Date: 06/12/2012 Time: 4098-1191 PT Time Calculation (min): 23 min  PT Assessment / Plan / Recommendation Comments on Treatment Session  Pt able to increase ambulation distance a little today however still presents with fatigue and weakness.  Will require mod assist upon d/c.  Daughter stated yesterday pt will d/c home.    Follow Up Recommendations  SNF;Other (comment) (family declines so HHPT)     Does the patient have the potential to tolerate intense rehabilitation     Barriers to Discharge        Equipment Recommendations  None recommended by PT    Recommendations for Other Services    Frequency     Plan Discharge plan remains appropriate;Frequency remains appropriate    Precautions / Restrictions Precautions Precautions: Fall Restrictions Weight Bearing Restrictions: No   Pertinent Vitals/Pain n/a    Mobility  Bed Mobility Bed Mobility: Supine to Sit Supine to Sit: 4: Min assist Details for Bed Mobility Assistance: assist for trunk upright, verbal cues for technique Transfers Transfers: Sit to Stand;Stand to Sit Sit to Stand: 3: Mod assist;With upper extremity assist;From bed;From chair/3-in-1 Stand to Sit: 4: Min assist;With upper extremity assist;To bed;To chair/3-in-1 Details for Transfer Assistance: increased assist to rise and control descent with multimodal cues for safe technique Ambulation/Gait Ambulation/Gait Assistance: 3: Mod assist Ambulation Distance (Feet): 40 Feet (x2) Assistive device: Rolling walker Ambulation/Gait Assistance Details: assist for weakness, pt continues to present with LE fatigue with increased trunk and leg flexion pattern as pt fatigues requiring cues for posture and extension, upon 2nd person grabbing chair for rest break, pt urinated on floor so assist safely to sitting and floor cleaned, socks changed while pt rested before  ambulating back to room Gait Pattern: Step-through pattern;Shuffle;Decreased stride length;Trunk flexed Gait velocity: decreased General Gait Details: max verbal cues for safe use of RW and posture, +2 for safety and chair    Exercises General Exercises - Lower Extremity Ankle Circles/Pumps: AROM;Both;10 reps   PT Diagnosis:    PT Problem List:   PT Treatment Interventions:     PT Goals Acute Rehab PT Goals Pt will go Supine/Side to Sit: with supervision PT Goal: Supine/Side to Sit - Progress: Updated due to goal met PT Goal: Sit to Stand - Progress: Progressing toward goal PT Goal: Ambulate - Progress: Progressing toward goal  Visit Information  Last PT Received On: 06/12/12 Assistance Needed: +2    Subjective Data  Subjective: I don't know where my daughter is.     Cognition  Cognition Arousal/Alertness: Awake/alert Behavior During Therapy: WFL for tasks assessed/performed Area of Impairment: Following commands Following Commands: Follows one step commands inconsistently;Follows one step commands with increased time General Comments: pt slow to process and required increased cues    Balance     End of Session PT - End of Session Equipment Utilized During Treatment: Gait belt Activity Tolerance: Patient limited by fatigue Patient left: in chair;with call bell/phone within reach;with nursing in room   GP     Kawon Willcutt,KATHrine E 06/12/2012, 10:39 AM Zenovia Jarred, PT, DPT 06/12/2012 Pager: [phone]

## 2012-06-13 ENCOUNTER — Other Ambulatory Visit: Payer: Self-pay

## 2012-06-13 ENCOUNTER — Inpatient Hospital Stay (HOSPITAL_COMMUNITY): Payer: Medicare Other

## 2012-06-13 ENCOUNTER — Encounter (HOSPITAL_COMMUNITY): Payer: Self-pay

## 2012-06-13 ENCOUNTER — Emergency Department (HOSPITAL_COMMUNITY): Payer: Medicare Other

## 2012-06-13 ENCOUNTER — Inpatient Hospital Stay (HOSPITAL_COMMUNITY)
Admission: EM | Admit: 2012-06-13 | Discharge: 2012-06-15 | DRG: 101 | Disposition: A | Discharge status: Home Health | Payer: Medicare Other | Attending: Family Medicine | Admitting: Family Medicine

## 2012-06-13 DIAGNOSIS — Z79899 Other long term (current) drug therapy: Secondary | ICD-10-CM

## 2012-06-13 DIAGNOSIS — R569 Unspecified convulsions: Secondary | ICD-10-CM

## 2012-06-13 DIAGNOSIS — I169 Hypertensive crisis, unspecified: Secondary | ICD-10-CM

## 2012-06-13 DIAGNOSIS — E1121 Type 2 diabetes mellitus with diabetic nephropathy: Secondary | ICD-10-CM | POA: Diagnosis present

## 2012-06-13 DIAGNOSIS — E785 Hyperlipidemia, unspecified: Secondary | ICD-10-CM | POA: Diagnosis present

## 2012-06-13 DIAGNOSIS — R471 Dysarthria and anarthria: Secondary | ICD-10-CM | POA: Diagnosis present

## 2012-06-13 DIAGNOSIS — M25569 Pain in unspecified knee: Secondary | ICD-10-CM | POA: Diagnosis present

## 2012-06-13 DIAGNOSIS — G8929 Other chronic pain: Secondary | ICD-10-CM | POA: Diagnosis present

## 2012-06-13 DIAGNOSIS — Z9181 History of falling: Secondary | ICD-10-CM

## 2012-06-13 DIAGNOSIS — F0390 Unspecified dementia without behavioral disturbance: Secondary | ICD-10-CM | POA: Diagnosis present

## 2012-06-13 DIAGNOSIS — N179 Acute kidney failure, unspecified: Secondary | ICD-10-CM | POA: Diagnosis present

## 2012-06-13 DIAGNOSIS — G9340 Encephalopathy, unspecified: Secondary | ICD-10-CM

## 2012-06-13 DIAGNOSIS — E119 Type 2 diabetes mellitus without complications: Secondary | ICD-10-CM | POA: Diagnosis present

## 2012-06-13 DIAGNOSIS — G40309 Generalized idiopathic epilepsy and epileptic syndromes, not intractable, without status epilepticus: Principal | ICD-10-CM | POA: Diagnosis present

## 2012-06-13 DIAGNOSIS — R1310 Dysphagia, unspecified: Secondary | ICD-10-CM | POA: Diagnosis present

## 2012-06-13 DIAGNOSIS — Z66 Do not resuscitate: Secondary | ICD-10-CM | POA: Diagnosis present

## 2012-06-13 DIAGNOSIS — I10 Essential (primary) hypertension: Secondary | ICD-10-CM | POA: Diagnosis present

## 2012-06-13 DIAGNOSIS — B1920 Unspecified viral hepatitis C without hepatic coma: Secondary | ICD-10-CM | POA: Diagnosis present

## 2012-06-13 DIAGNOSIS — D638 Anemia in other chronic diseases classified elsewhere: Secondary | ICD-10-CM | POA: Diagnosis present

## 2012-06-13 LAB — CBC WITH DIFFERENTIAL/PLATELET
Basophils Relative: 0 % (ref 0–1)
Eosinophils Absolute: 0.1 10*3/uL (ref 0.0–0.7)
Lymphs Abs: 2.2 10*3/uL (ref 0.7–4.0)
MCH: 33.1 pg (ref 26.0–34.0)
MCHC: 35.7 g/dL (ref 30.0–36.0)
Neutrophils Relative %: 70 % (ref 43–77)
Platelets: 232 10*3/uL (ref 150–400)
RBC: 3.54 MIL/uL — ABNORMAL LOW (ref 3.87–5.11)

## 2012-06-13 LAB — COMPREHENSIVE METABOLIC PANEL
ALT: 40 U/L — ABNORMAL HIGH (ref 0–35)
Albumin: 3 g/dL — ABNORMAL LOW (ref 3.5–5.2)
Alkaline Phosphatase: 78 U/L (ref 39–117)
Potassium: 3.7 mEq/L (ref 3.5–5.1)
Sodium: 135 mEq/L (ref 135–145)
Total Protein: 8 g/dL (ref 6.0–8.3)

## 2012-06-13 LAB — URINE MICROSCOPIC-ADD ON

## 2012-06-13 LAB — URINALYSIS, ROUTINE W REFLEX MICROSCOPIC
Bilirubin Urine: NEGATIVE
Glucose, UA: >1000 mg/dL — AB
Ketones, ur: NEGATIVE mg/dL
Leukocytes, UA: NEGATIVE
pH: 7 (ref 5.0–8.0)

## 2012-06-13 MED ORDER — ENSURE COMPLETE PO LIQD
237.0000 mL | Freq: Two times a day (BID) | ORAL | Status: DC
Start: 2012-06-14 — End: 2012-06-15
  Filled 2012-06-13: qty 237

## 2012-06-13 MED ORDER — LOSARTAN POTASSIUM 50 MG PO TABS: 100.0000 mg | ORAL_TABLET | Freq: Every day | ORAL | Status: DC

## 2012-06-13 MED ORDER — LABETALOL HCL 5 MG/ML IV SOLN
10.0000 mg | INTRAVENOUS | Status: AC | PRN
  Administered 2012-06-13 (×3): 10 mg via INTRAVENOUS
  Filled 2012-06-13 (×2): qty 4

## 2012-06-13 MED ORDER — DICLOFENAC SODIUM 50 MG PO TBEC
50.0000 mg | DELAYED_RELEASE_TABLET | Freq: Two times a day (BID) | ORAL | Status: DC
  Administered 2012-06-14 – 2012-06-15 (×4): 50 mg via ORAL
  Filled 2012-06-13 (×5): qty 1

## 2012-06-13 MED ORDER — SIMVASTATIN 20 MG PO TABS
20.0000 mg | ORAL_TABLET | Freq: Every day | ORAL | Status: DC
  Administered 2012-06-14 (×2): 20 mg via ORAL
  Filled 2012-06-13 (×3): qty 1

## 2012-06-13 MED ORDER — LABETALOL HCL 5 MG/ML IV SOLN: 5.0000 mg | INTRAVENOUS | Status: DC | PRN

## 2012-06-13 MED ORDER — SODIUM CHLORIDE 0.9 % IV BOLUS (SEPSIS)
500.0000 mL | Freq: Once | INTRAVENOUS | Status: AC
  Administered 2012-06-13: 500 mL via INTRAVENOUS

## 2012-06-13 MED ORDER — LORAZEPAM 2 MG/ML IJ SOLN: 1.0000 mg | Freq: Once | INTRAMUSCULAR | Status: DC

## 2012-06-13 MED ORDER — LEVOBUNOLOL HCL 0.5 % OP SOLN
1.0000 [drp] | Freq: Two times a day (BID) | OPHTHALMIC | Status: DC
  Administered 2012-06-14 – 2012-06-15 (×4): 1 [drp] via OPHTHALMIC
  Filled 2012-06-13: qty 5

## 2012-06-13 MED ORDER — NICARDIPINE HCL IN NACL 20-0.86 MG/200ML-% IV SOLN: 5.0000 mg/h | INTRAVENOUS | Status: DC

## 2012-06-13 MED ORDER — LOTEPREDNOL ETABONATE 0.5 % OP SUSP
1.0000 [drp] | Freq: Two times a day (BID) | OPHTHALMIC | Status: DC
  Administered 2012-06-14 (×2): 1 [drp] via OPHTHALMIC
  Filled 2012-06-13: qty 5

## 2012-06-13 MED ORDER — LORAZEPAM 2 MG/ML IJ SOLN
INTRAMUSCULAR | Status: AC
  Filled 2012-06-13: qty 1

## 2012-06-13 MED ORDER — ONDANSETRON HCL 4 MG/2ML IJ SOLN
INTRAMUSCULAR | Status: AC
  Administered 2012-06-13: 4 mg
  Filled 2012-06-13: qty 2

## 2012-06-13 MED ORDER — LOSARTAN POTASSIUM 50 MG PO TABS
100.0000 mg | ORAL_TABLET | Freq: Every day | ORAL | Status: DC
  Administered 2012-06-14 – 2012-06-15 (×2): 100 mg via ORAL
  Filled 2012-06-13 (×2): qty 2

## 2012-06-13 MED ORDER — INSULIN ASPART 100 UNIT/ML ~~LOC~~ SOLN
0.0000 [IU] | SUBCUTANEOUS | Status: DC
  Administered 2012-06-14 (×2): 3 [IU] via SUBCUTANEOUS

## 2012-06-13 MED ORDER — SODIUM CHLORIDE 0.9 % IJ SOLN
3.0000 mL | Freq: Two times a day (BID) | INTRAMUSCULAR | Status: DC
  Administered 2012-06-14 (×2): 3 mL via INTRAVENOUS

## 2012-06-13 MED ORDER — LORAZEPAM 2 MG/ML IJ SOLN
1.0000 mg | Freq: Once | INTRAMUSCULAR | Status: DC
  Filled 2012-06-13: qty 1

## 2012-06-13 MED ORDER — NICARDIPINE HCL IN NACL 20-0.86 MG/200ML-% IV SOLN
2.5000 mg/h | Freq: Once | INTRAVENOUS | Status: AC
  Administered 2012-06-13: 3 mg/h via INTRAVENOUS
  Filled 2012-06-13: qty 200

## 2012-06-13 MED ORDER — HEPARIN SODIUM (PORCINE) 5000 UNIT/ML IJ SOLN
5000.0000 [IU] | Freq: Three times a day (TID) | INTRAMUSCULAR | Status: DC
  Administered 2012-06-14 – 2012-06-15 (×5): 5000 [IU] via SUBCUTANEOUS
  Filled 2012-06-13 (×8): qty 1

## 2012-06-13 NOTE — ED Notes (Signed)
Patient transported to CT 

## 2012-06-13 NOTE — H&P (Signed)
[facility] History and Physical  [name]:[id number] DOB: 1922-05-13 DOA: 06/13/2012  Referring physician: ED PCP: No primary provider on file.  Specialists: None  Chief Complaint: Seizure  HPI: [name] is a 79 y.o. female who was just discharged from our [facility] service yesterday and is brought in by family after having witnessed seizure like activity.  The family member is able to describe a generalized tonic-clonic seizure with deviation to the R sided followed by clonic activity involving the RUE then rapid spread to the entire body.  No previous seizure history.  CT head in ED was negative.  Patient afebrile, lab studies unremarkable (hypercalcemia seen at [facility] has resolved, Ca here is 9.7).  Of note patient was noted to be very hypertensive at 209/85 initially and didn't respond to labetalol so cardene was started in ED then later stopped after her BP came down to the 130s.  Neurology was consulted and hospitalist has been asked to admit.  Review of Systems: 12 systems reviewed and otherwise negative.  Past Medical History  Diagnosis Date  . Arthritis   . Hypertension   . Diabetes mellitus without complication   . Hepatitis C    History reviewed. No pertinent past surgical history. Social History:  reports that she has never smoked. She does not have any smokeless tobacco history on file. She reports that she does not drink alcohol or use illicit drugs.   No Known Allergies  No family history on file.  Prior to Admission medications   Medication Sig Start Date End Date Taking? Authorizing Provider  diclofenac (VOLTAREN) 50 MG EC tablet Take 50 mg by mouth 2 (two) times daily.   Yes Historical Provider, MD  feeding supplement (ENSURE COMPLETE) LIQD Take 237 mLs by mouth 2 (two) times daily between meals. 06/12/12  Yes Alison Murray, MD  levobunolol (BETAGAN) 0.5 % ophthalmic solution Place 1 drop into both eyes 2 (two) times daily.   Yes Historical Provider,  MD  losartan (COZAAR) 100 MG tablet Take 100 mg by mouth daily.   Yes Historical Provider, MD  loteprednol (LOTEMAX) 0.5 % ophthalmic suspension Place 1 drop into both eyes 2 (two) times daily.   Yes Historical Provider, MD  lovastatin (MEVACOR) 40 MG tablet Take 40 mg by mouth at bedtime.   Yes Historical Provider, MD  metFORMIN (GLUMETZA) 500 MG (MOD) 24 hr tablet Take 500-1,000 mg by mouth 2 (two) times daily with a meal. Takes 1 tablet(500mg ) every morning  And 2 tablets(1000mg ) every evening   Yes Historical Provider, MD  polyethylene glycol (MIRALAX / GLYCOLAX) packet Take 17 g by mouth daily. 06/12/12  Yes Alison Murray, MD   Physical Exam: Filed Vitals:   06/13/12 2105 06/13/12 2110 06/13/12 2115 06/13/12 2130  BP: 140/60 135/62 142/65 139/55  Pulse: 97 92 98 94  Temp:      TempSrc:      Resp: 32 35 23 28  SpO2: 98% 99% 98% 98%    General:  NAD, resting comfortably in bed Eyes: Cornias opacified ENT: mucous membranes moist Neck: supple w/o JVD Cardiovascular: RRR w/o MRG Respiratory: CTA B Abdomen: soft, nt, nd, bs+ Skin: no rash nor lesion Musculoskeletal: MAE, full ROM all 4 extremities Psychiatric: normal tone and affect Neurologic: Alert oriented to the fact that she is in the hospital, no LUE or LLE voluntary movement, is able to very weakly grasp my hand with the RUE   Labs on Admission:  Basic Metabolic Panel:  Recent Labs Lab 06/09/12 1810 06/10/12 0650 06/11/12 0405 06/13/12 1539  NA 137  --  139 135  K 4.8  --  3.8 3.7  CL 100  --  106 101  CO2 29  --  27 21  GLUCOSE 116*  --  157* 275*  BUN 24*  --  13 12  CREATININE 1.06 0.85 0.60 0.58  CALCIUM 11.9*  --  9.8 9.7   Liver Function Tests:  Recent Labs Lab 06/09/12 1810 06/13/12 1539  AST 37 44*  ALT 38* 40*  ALKPHOS 60 78  BILITOT 0.4 0.5  PROT 8.2 8.0  ALBUMIN 3.3* 3.0*   No results found for this basename: LIPASE, AMYLASE,  in the last 168 hours  Recent Labs Lab 06/10/12 0010   AMMONIA 23   CBC:  Recent Labs Lab 06/09/12 1810 06/10/12 0650 06/13/12 1539  WBC 10.2 7.1 10.0  NEUTROABS 5.7  --  6.9  HGB 10.7* 10.3* 11.7*  HCT 31.9* 29.6* 32.8*  MCV 96.7 96.1 92.7  PLT 247 211 232   Cardiac Enzymes:  Recent Labs Lab 06/10/12 0010 06/10/12 0650 06/13/12 1550  CKTOTAL  --  23  --   TROPONINI <0.30  --  <0.30    BNP (last 3 results) No results found for this basename: PROBNP,  in the last 8760 hours CBG:  Recent Labs Lab 06/11/12 1135 06/11/12 1634 06/11/12 2051 06/12/12 0753 06/12/12 1229  GLUCAP 129* 146* 197* 143* 149*    Radiological Exams on Admission: Dg Chest 1 View  06/13/2012  *RADIOLOGY REPORT*  Clinical Data: Seizures, hypertension  CHEST - 1 VIEW  Comparison: April 07, 2008.  Findings: Cardiomediastinal silhouette appears normal.  Mild interstitial densities are noted throughout both lungs which are unchanged and most consistent with scarring.  No pleural effusion or pneumothorax is noted.  Bony thorax is intact.  No acute pulmonary disease is noted.  IMPRESSION: No acute cardiopulmonary abnormality seen.   Original Report Authenticated By: Lupita Raider.,  M.D.    Ct Head Wo Contrast  06/13/2012  *RADIOLOGY REPORT*  Clinical Data: Witnessed seizure lasting approximately 2 minutes  CT HEAD WITHOUT CONTRAST  Technique:  Contiguous axial images were obtained from the base of the skull through the vertex without contrast.  Comparison: 06/09/2012  Findings: Motion artifacts, despite repeat imaging. Portions of posterior fossa and middle cranial fossae are incompletely visualized and inadequately assessed.  Generalized atrophy. Normal ventricular morphology. No midline shift or mass effect. Small vessel chronic ischemic changes of deep cerebral white matter. Within limits imposed by motion artifacts, no gross intracranial hemorrhage, mass lesion or evidence of acute infarction identified. No definite extra-axial fluid collections. Bones  appear diffusely demineralized. Calvaria intact. Sinuses suboptimally evaluated due to motion, grossly unopacified.  IMPRESSION: Atrophy with small vessel chronic ischemic changes of deep cerebral white matter. No definite acute intracranial abnormalities identified on exam limited by motion artifacts as above.   Original Report Authenticated By: Ulyses Southward, M.D.     EKG: Independently reviewed.  Assessment/Plan Principal Problem:   Seizure Active Problems:   Type 2 diabetes mellitus   Unspecified essential hypertension   1. Seizure - EEG ordered, also have ordered MRI/MRA of brain to r/o CVA given the persistent L sided weakness, patient needs to have CVA workup if this comes back positive. 2. DM2 - holding home metformin and putting patient on med dose SSI q4h since its unclear how much she will be eating, also needs to pass swallow screen  when she wakes up some more before eating. 3. HTN - continue home meds, adding PRN labetalol, if this fails then cardene has been demonstrated to work but likely is more than she needs at this point (cardene gtt has been stopped).  Dr. Amada Jupiter has already seen patient in consult, his note is in the chart.  Code Status: Full Code (must indicate code status--if unknown or must be presumed, indicate so) Family Communication: Spoke with daughter at bedside (indicate person spoken with, if applicable, with phone number if by telephone) Disposition Plan: Admit to inpatient (indicate anticipated LOS)  Time spent: 70 min  GARDNER, JARED M. [facility] Pager [phone]  If 7PM-7AM, please contact night-coverage [email or web address] Password [facility] 06/13/2012, 9:50 PM

## 2012-06-13 NOTE — ED Notes (Signed)
Dr. Beverely Pace made aware of BP variations.

## 2012-06-13 NOTE — ED Provider Notes (Signed)
I saw and evaluated the patient, reviewed the resident's note and I agree with the findings and plan.   .Face to face Exam:  General:  Awake HEENT:  Atraumatic Resp:  Normal effort Abd:  Nondistended Lymph: No adenopathy   Nelia Shi, MD 06/13/12 2318

## 2012-06-13 NOTE — ED Notes (Signed)
Dr. Roseanne Reno at the bedside. Pt began vomiting yellow, orange colored emesis.

## 2012-06-13 NOTE — Consult Note (Addendum)
NEURO HOSPITALIST CONSULT NOTE    Reason for Consult: Generalized seizure activity.  HPI:                                                                                                                                          [name] is an 79 y.o. female history of diabetes mellitus, hypertension and arthritis who was discharged from [facility] yesterday, presenting today with a witnessed generalized tonic-clonic seizure. Patient was noted to have a deviation to the right side followed by clonic activity involving the right upper extremity then rapid spread to entire body. There is no previous history of seizure activity. CT scan of her head showed no acute intracranial abnormality. Patient was afebrile. Laboratory studies were unremarkable with no significant electrolyte abnormality. Blood sugar was 275. She was admitted to [facility] on 06/09/2012 mental status changes with encephalopathy which is thought to be metabolic for the most part. Patient was doing well except for being less active than usual following discharge to home. Blood pressure was elevated on arrival in the emergency room requiring Cardene drip, as repeated doses of labetalol were ineffective adequately lowering patient's blood pressure.  Past Medical History  Diagnosis Date  . Arthritis   . Hypertension   . Diabetes mellitus without complication   . Hepatitis C     History reviewed. No pertinent past surgical history.  No family history on file.    Social History:  reports that she has never smoked. She does not have any smokeless tobacco history on file. She reports that she does not drink alcohol or use illicit drugs.  No Known Allergies  MEDICATIONS:                                                                                                                     Prior to Admission:  diclofenac 50 MG EC tablet   Commonly known as: VOLTAREN   Take 50 mg by  mouth 2 (two) times daily.    feeding supplement Liqd   Take 237 mLs by mouth 2 (two) times daily between meals.    levobunolol 0.5 % ophthalmic solution   Commonly known as: BETAGAN   Place 1 drop into both eyes 2 (two) times daily.  losartan 100 MG tablet   Commonly known as: COZAAR   Take 100 mg by mouth daily.    lovastatin 40 MG tablet   Commonly known as: MEVACOR   Take 40 mg by mouth at bedtime.    metFORMIN 500 MG (MOD) 24 hr tablet   Commonly known as: GLUMETZA   Take 500-1,000 mg by mouth 2 (two) times daily with a meal. Takes 1 tablet(500mg ) every morning And 2 tablets(1000mg ) every evening    polyethylene glycol packet   Commonly known as: MIRALAX / GLYCOLAX   Take 17 g by mouth daily.       Blood pressure 171/54, pulse 87, temperature 99.8 F (37.7 C), temperature source Rectal, resp. rate 31, SpO2 97.00%.   Neurologic Examination:                                                                                                      Mental Status: Alert, oriented to place but disoriented to time including correct age.Marland Kitchen  Speech fluent without evidence of aphasia. Able to follow commands with use of right extremities; no voluntary use of left extremities. Cranial Nerves: II-unable to assess due to poor vision with opacity of corneas, right greater than left. III/IV/VI-unable to assess. Extraocular movements were full and conjugate.    V/VII-no facial numbness; flattening of left nasolabial fold. VIII-normal. X-slight dysarthria, consistent with being edentulous. Motor: No voluntary movement of left extremities with flaccid muscle tone; normal tone and strength of right extremities. Deep Tendon Reflexes: Trace to 1+ and symmetric. Plantars: Mute bilaterally  Lab Results  Component Value Date/Time   CHOL  Value: 164        ATP III CLASSIFICATION:  <200     mg/dL   Desirable  161-096  mg/dL   Borderline High  >=045    mg/dL   High        05/21/8117  5:21 AM     Results for orders placed during the hospital encounter of 06/13/12 (from the past 48 hour(s))  CBC WITH DIFFERENTIAL     Status: Abnormal   Collection Time    06/13/12  3:39 PM      Result Value Range   WBC 10.0  4.0 - 10.5 K/uL   RBC 3.54 (*) 3.87 - 5.11 MIL/uL   Hemoglobin 11.7 (*) 12.0 - 15.0 g/dL   HCT 14.7 (*) 82.9 - 56.2 %   MCV 92.7  78.0 - 100.0 fL   MCH 33.1  26.0 - 34.0 pg   MCHC 35.7  30.0 - 36.0 g/dL   RDW 13.0  86.5 - 78.4 %   Platelets 232  150 - 400 K/uL   Neutrophils Relative 70  43 - 77 %   Neutro Abs 6.9  1.7 - 7.7 K/uL   Lymphocytes Relative 22  12 - 46 %   Lymphs Abs 2.2  0.7 - 4.0 K/uL   Monocytes Relative 7  3 - 12 %   Monocytes Absolute 0.7  0.1 - 1.0 K/uL   Eosinophils Relative 1  0 - 5 %  Eosinophils Absolute 0.1  0.0 - 0.7 K/uL   Basophils Relative 0  0 - 1 %   Basophils Absolute 0.0  0.0 - 0.1 K/uL  COMPREHENSIVE METABOLIC PANEL     Status: Abnormal   Collection Time    06/13/12  3:39 PM      Result Value Range   Sodium 135  135 - 145 mEq/L   Potassium 3.7  3.5 - 5.1 mEq/L   Chloride 101  96 - 112 mEq/L   CO2 21  19 - 32 mEq/L   Glucose, Bld 275 (*) 70 - 99 mg/dL   BUN 12  6 - 23 mg/dL   Creatinine, Ser 4.78  0.50 - 1.10 mg/dL   Calcium 9.7  8.4 - 29.5 mg/dL   Total Protein 8.0  6.0 - 8.3 g/dL   Albumin 3.0 (*) 3.5 - 5.2 g/dL   AST 44 (*) 0 - 37 U/L   ALT 40 (*) 0 - 35 U/L   Alkaline Phosphatase 78  39 - 117 U/L   Total Bilirubin 0.5  0.3 - 1.2 mg/dL   GFR calc non Af Amer 80 (*) >90 mL/min   GFR calc Af Amer >90  >90 mL/min   Comment:            The eGFR has been calculated     using the CKD EPI equation.     This calculation has not been     validated in all clinical     situations.     eGFR's persistently     <90 mL/min signify     possible Chronic Kidney Disease.  TROPONIN I     Status: None   Collection Time    06/13/12  3:50 PM      Result Value Range   Troponin I <0.30  <0.30 ng/mL   Comment:            Due to the  release kinetics of cTnI,     a negative result within the first hours     of the onset of symptoms does not rule out     myocardial infarction with certainty.     If myocardial infarction is still suspected,     repeat the test at appropriate intervals.  URINALYSIS, ROUTINE W REFLEX MICROSCOPIC     Status: Abnormal   Collection Time    06/13/12  4:50 PM      Result Value Range   Color, Urine YELLOW  YELLOW   APPearance CLEAR  CLEAR   Specific Gravity, Urine 1.015  1.005 - 1.030   pH 7.0  5.0 - 8.0   Glucose, UA >1000 (*) NEGATIVE mg/dL   Hgb urine dipstick NEGATIVE  NEGATIVE   Bilirubin Urine NEGATIVE  NEGATIVE   Ketones, ur NEGATIVE  NEGATIVE mg/dL   Protein, ur NEGATIVE  NEGATIVE mg/dL   Urobilinogen, UA 1.0  0.0 - 1.0 mg/dL   Nitrite NEGATIVE  NEGATIVE   Leukocytes, UA NEGATIVE  NEGATIVE  URINE MICROSCOPIC-ADD ON     Status: None   Collection Time    06/13/12  4:50 PM      Result Value Range   WBC, UA 0-2  <3 WBC/hpf   RBC / HPF 0-2  <3 RBC/hpf    Dg Chest 1 View  06/13/2012  *RADIOLOGY REPORT*  Clinical Data: Seizures, hypertension  CHEST - 1 VIEW  Comparison: April 07, 2008.  Findings: Cardiomediastinal silhouette appears normal.  Mild interstitial densities are noted throughout  both lungs which are unchanged and most consistent with scarring.  No pleural effusion or pneumothorax is noted.  Bony thorax is intact.  No acute pulmonary disease is noted.  IMPRESSION: No acute cardiopulmonary abnormality seen.   Original Report Authenticated By: Lupita Raider.,  M.D.    Ct Head Wo Contrast  06/13/2012  *RADIOLOGY REPORT*  Clinical Data: Witnessed seizure lasting approximately 2 minutes  CT HEAD WITHOUT CONTRAST  Technique:  Contiguous axial images were obtained from the base of the skull through the vertex without contrast.  Comparison: 06/09/2012  Findings: Motion artifacts, despite repeat imaging. Portions of posterior fossa and middle cranial fossae are incompletely visualized  and inadequately assessed.  Generalized atrophy. Normal ventricular morphology. No midline shift or mass effect. Small vessel chronic ischemic changes of deep cerebral white matter. Within limits imposed by motion artifacts, no gross intracranial hemorrhage, mass lesion or evidence of acute infarction identified. No definite extra-axial fluid collections. Bones appear diffusely demineralized. Calvaria intact. Sinuses suboptimally evaluated due to motion, grossly unopacified.  IMPRESSION: Atrophy with small vessel chronic ischemic changes of deep cerebral white matter. No definite acute intracranial abnormalities identified on exam limited by motion artifacts as above.   Original Report Authenticated By: Ulyses Southward, M.D.      Assessment/Plan: New-onset of seizure activity with pattern indicative of partial seizure with secondary generalization. Etiology is unclear. Lack of movement voluntarily of left extremities as well as putting of left nasolabial fold were indicative of possible acute stroke. Blood pressure was significantly elevated on arrival, indicating possible hypertensive encephalopathy as etiology for seizure activity.  Recommendations: 1. MRI of the brain without contrast to rule out acute stroke. 2. Stroke workup protocol his acute stroke is demonstrated on MRI. 3. EEG, routine adult. 4. No anticonvulsant medication unless patient has a recurrent seizure, or EEG shows indication of increased risk for seizure recurrence.  Venetia Maxon M.D. [facility] [phone]  06/13/2012, 7:24 PM

## 2012-06-13 NOTE — ED Notes (Signed)
Per [facility], pt from home for witnessed seizure lasting about 2 minutes. Pt now alert, able to answer questions. No oral trauma noted by EMS. 22g to RW. Family states that she was recently d/c'd from [facility] on 4/28 for dehydration. Family states she is typically able to get around the house without any difficulty. NSR on the monitor with PVC's

## 2012-06-13 NOTE — ED Provider Notes (Signed)
History     CSN: [id number]  Arrival date & time 06/13/12  1517   None     No chief complaint on file.   (Consider location/radiation/quality/duration/timing/severity/associated sxs/prior treatment) HPI Comments: 79 y.o. DNR/DNI female with a past medical history of type 2 diabetes, chronic knee pain, recurrent falls here after a reported seizure at home.  Seizure reportedly started with deep breathing, then right hand shaking and then generalized to full body rhythmic shaking.  She was eating just prior to the episode.  Her daugthers report that she was otherwise feeling well prior the event.  No falls, n/v, diarrhea, fevers or other issues since discharge.  FSBS WNLs with EMS.  SBP >200.  She was just in [facility] from 4/28-5/1 for encephalopathy.  She presented to [facility] ED 06/09/2012 with altered mental status and generalized weakness. Evaluation in ED included CT head which was negative for acute intracranial findings. During her workup she was found to have an elevated calcium level at 11.9.  Her workup showed normal ammonia, normal TSH, normal CK, hgb 10.3, normal Vit D, PTH returned low at 5.3.  Urine Cx at that time with Klebseilla Pneumoniae 45k CFUs.   Patient is a 79 y.o. female presenting with seizures. The history is provided by the patient.  Seizures Seizure activity on arrival: no   Seizure type:  Grand mal Preceding symptoms: hyperventilation   Initial focality: right hand. Episode characteristics: generalized shaking, incontinence and unresponsiveness   Postictal symptoms: confusion   Return to baseline: almost back at baseline per family.   Severity:  Mild Duration:  2 minutes Timing:  Once Progression:  Resolved Recent head injury:  No recent head injuries History of seizures: no     Past Medical History  Diagnosis Date  . Arthritis   . Hypertension   . Diabetes mellitus without complication   . Hepatitis C     No past surgical history on file.  No family history on  file.  History  Substance Use Topics  . Smoking status: Never Smoker   . Smokeless tobacco: Not on file  . Alcohol Use: No    OB History   Grav Para Term Preterm Abortions TAB SAB Ect Mult Living                  Review of Systems  Unable to perform ROS: Mental status change  Neurological: Positive for seizures.    Allergies  Review of patient's allergies indicates no known allergies.  Home Medications   Current Outpatient Rx  Name  Route  Sig  Dispense  Refill  . diclofenac (VOLTAREN) 50 MG EC tablet   Oral   Take 50 mg by mouth 2 (two) times daily.         . feeding supplement (ENSURE COMPLETE) LIQD   Oral   Take 237 mLs by mouth 2 (two) times daily between meals.   1 Bottle   3   . levobunolol (BETAGAN) 0.5 % ophthalmic solution   Both Eyes   Place 1 drop into both eyes 2 (two) times daily.         Marland Kitchen losartan (COZAAR) 100 MG tablet   Oral   Take 100 mg by mouth daily.         Marland Kitchen lovastatin (MEVACOR) 40 MG tablet   Oral   Take 40 mg by mouth at bedtime.         . metFORMIN (GLUMETZA) 500 MG (MOD) 24 hr tablet   Oral  Take 500-1,000 mg by mouth 2 (two) times daily with a meal. Takes 1 tablet(500mg ) every morning  And 2 tablets(1000mg ) every evening         . polyethylene glycol (MIRALAX / GLYCOLAX) packet   Oral   Take 17 g by mouth daily.   14 each   0     BP 196/81  Pulse 89  Temp(Src) 99.8 F (37.7 C) (Rectal)  Resp 38  SpO2 92%  Physical Exam  Vitals reviewed. Constitutional: She appears well-developed and well-nourished.  HENT:  Right Ear: External ear normal.  Left Ear: External ear normal.  Mouth/Throat: No oropharyngeal exudate.  Eyes: Conjunctivae and EOM are normal.  Right pupil obscured by opaque cornea; left pupil round reactive with partial opacity of left cornea  Neck: Normal range of motion. Neck supple.  Cardiovascular: Normal rate, regular rhythm, normal heart sounds and intact distal pulses.  Exam reveals no  gallop and no friction rub.   No murmur heard. Pulmonary/Chest: Effort normal and breath sounds normal.  Abdominal: Soft. Bowel sounds are normal. She exhibits no distension. There is no tenderness.  Diaper full of stool on arrival  Musculoskeletal: Normal range of motion. She exhibits no edema.  Neurological: She is alert. No cranial nerve deficit.  Oriented to name, DOB and place, not date Follows commands in all 4 extremities. Sensation grossly intact No appreciable CN deficits 4/5 strength in BUEs, 4/5 RLE, 3/5 LLE  Skin: Skin is warm and dry. No rash noted.  Psychiatric: She has a normal mood and affect.    ED Course  Procedures (including critical care time)  Labs Reviewed  CBC WITH DIFFERENTIAL - Abnormal; Notable for the following:    RBC 3.54 (*)    Hemoglobin 11.7 (*)    HCT 32.8 (*)    All other components within normal limits  COMPREHENSIVE METABOLIC PANEL - Abnormal; Notable for the following:    Glucose, Bld 275 (*)    Albumin 3.0 (*)    AST 44 (*)    ALT 40 (*)    GFR calc non Af Amer 80 (*)    All other components within normal limits  TROPONIN I  URINALYSIS, ROUTINE W REFLEX MICROSCOPIC   Dg Chest 1 View  06/13/2012  *RADIOLOGY REPORT*  Clinical Data: Seizures, hypertension  CHEST - 1 VIEW  Comparison: April 07, 2008.  Findings: Cardiomediastinal silhouette appears normal.  Mild interstitial densities are noted throughout both lungs which are unchanged and most consistent with scarring.  No pleural effusion or pneumothorax is noted.  Bony thorax is intact.  No acute pulmonary disease is noted.  IMPRESSION: No acute cardiopulmonary abnormality seen.   Original Report Authenticated By: Lupita Raider.,  M.D.    Ct Head Wo Contrast  06/13/2012  *RADIOLOGY REPORT*  Clinical Data: Witnessed seizure lasting approximately 2 minutes  CT HEAD WITHOUT CONTRAST  Technique:  Contiguous axial images were obtained from the base of the skull through the vertex without  contrast.  Comparison: 06/09/2012  Findings: Motion artifacts, despite repeat imaging. Portions of posterior fossa and middle cranial fossae are incompletely visualized and inadequately assessed.  Generalized atrophy. Normal ventricular morphology. No midline shift or mass effect. Small vessel chronic ischemic changes of deep cerebral white matter. Within limits imposed by motion artifacts, no gross intracranial hemorrhage, mass lesion or evidence of acute infarction identified. No definite extra-axial fluid collections. Bones appear diffusely demineralized. Calvaria intact. Sinuses suboptimally evaluated due to motion, grossly unopacified.  IMPRESSION: Atrophy with  small vessel chronic ischemic changes of deep cerebral white matter. No definite acute intracranial abnormalities identified on exam limited by motion artifacts as above.   Original Report Authenticated By: Ulyses Southward, M.D.     Date: 06/13/2012  Rate: 87  Rhythm: normal sinus rhythm  QRS Axis: left  Intervals: normal  ST/T Wave abnormalities: NSTW flattening  Conduction Disutrbances:none  Narrative Interpretation: NSR, borderline LAD, no significiant change from EKG 06/09/12  Old EKG Reviewed: unchanged   1. Seizure   2. Type 2 diabetes mellitus   3. Unspecified essential hypertension   4. Hypertensive crisis       MDM    79 y.o. DNR/DNI female with a past medical history of type 2 diabetes, chronic knee pain, recurrent falls here after a reported seizure at home.  Seizure reportedly started with deep breathing, then right hand shaking and then generalized to full body rhythmic shaking.  She was eating just prior to the episode.  Her daugthers report that she was otherwise feeling well prior the event.  No falls, n/v, diarrhea, fevers or other issues since discharge.  FSBS WNLs with EMS.  SBP >200.  She was just in [facility] from 4/28-5/1 for encephalopathy.  She presented to [facility] ED 06/09/2012 with altered mental status and generalized  weakness. Evaluation in ED included CT head which was negative for acute intracranial findings. During her workup she was found to have an elevated calcium level at 11.9.  Her workup showed normal ammonia, normal TSH, normal CK, hgb 10.3, normal Vit D, PTH returned low at 5.3.  Urine Cx at that time with Klebseilla Pneumoniae 45k CFUs.  Afebrile on rectal temp here.  Hypertensive to 190s.  Oriented to name, DOB and place, not date.  Follows commands in all 4 extremities.  4/5 strength in BUEs, 4/5 RLE, 3/5 LLE.  Sensation grossly intact.     Diff Dx: CVA, new onset seizures, hypertensive encephalopathy, electrolyte abnormality, CNS infection.  Given her well appearance beforehand and the fact that she is returning to baseline, has no meningismus and is afebrile here, doubt CNS infection.  Pt would not be a tPA candidate given sz episode.   CBC, CMP, UA, EKG, CXR, CT head, trop.  Neuro consult.  5:36 PM Workup unrevealing.  SBPs > 200 now, so labetalol ordered.  6:46 PM BPs still elevated after labetalol x3.  Will place on cardene gtt.  With placement on cardene gtt, pt will need Critical Care admission.  Neurology to follow.  Disposition: Admit  Condition: Stable  Pt seen in conjunction with my attending, Dr. Radford Pax.Oleh Genin, MD PGY-II [facility] Emergency Medicine Resident   Oleh Genin, MD 06/13/12 [phone]

## 2012-06-13 NOTE — ED Notes (Signed)
Dr. Beverely Pace at the bedside with daughters.

## 2012-06-13 NOTE — ED Notes (Signed)
cardene stopped, admitting MD at [facility].

## 2012-06-13 NOTE — ED Notes (Signed)
In and Out complete. Pt tolerated well. Pt had an other bowel movement. Pt cleaned up.

## 2012-06-13 NOTE — ED Notes (Addendum)
VSS, continuing to monitor while on cardene. Pending admitting MD to see/ admission orders.

## 2012-06-13 NOTE — ED Notes (Signed)
Received lorazepam 2mg  IV from [place], [place]. Ready to give prn for sz activity per Dr. Roseanne Reno.

## 2012-06-13 NOTE — ED Notes (Signed)
Back from MRI, no changes, VSS, arousable & interactive, to 3300 on monitor.

## 2012-06-13 NOTE — ED Notes (Signed)
Pt changed and cleaned up. Tolerated well.

## 2012-06-13 NOTE — ED Notes (Addendum)
VSS, resting with eyes closed, PCCM at [facility], following commands, family at [facility] x4. cardene decreased from 3mg /hr to 1mg /hr per PCCM. Will continue to monitor.

## 2012-06-13 NOTE — ED Notes (Signed)
MRI ready for pt, to MRI prior to 3300. May go off of monitor per Dr. Julian Reil.

## 2012-06-13 NOTE — ED Notes (Signed)
Phlebotomy at the bedside  

## 2012-06-14 ENCOUNTER — Inpatient Hospital Stay (HOSPITAL_COMMUNITY): Payer: Medicare Other

## 2012-06-14 ENCOUNTER — Encounter (HOSPITAL_COMMUNITY): Payer: Self-pay | Admitting: *Deleted

## 2012-06-14 DIAGNOSIS — G9340 Encephalopathy, unspecified: Secondary | ICD-10-CM

## 2012-06-14 LAB — GLUCOSE, CAPILLARY
Glucose-Capillary: 191 mg/dL — ABNORMAL HIGH (ref 70–99)
Glucose-Capillary: 152 mg/dL — ABNORMAL HIGH (ref 70–99)
Glucose-Capillary: 109 mg/dL — ABNORMAL HIGH (ref 70–99)
Glucose-Capillary: 103 mg/dL — ABNORMAL HIGH (ref 70–99)

## 2012-06-14 LAB — CBC
Platelets: 197 10*3/uL (ref 150–400)
RBC: 3.13 MIL/uL — ABNORMAL LOW (ref 3.87–5.11)
WBC: 18.5 10*3/uL — ABNORMAL HIGH (ref 4.0–10.5)

## 2012-06-14 LAB — BASIC METABOLIC PANEL
Calcium: 9.3 mg/dL (ref 8.4–10.5)
GFR calc Af Amer: 85 mL/min — ABNORMAL LOW (ref >90)
GFR calc non Af Amer: 73 mL/min — ABNORMAL LOW (ref >90)
Sodium: 135 mEq/L (ref 135–145)

## 2012-06-14 LAB — MRSA PCR SCREENING: MRSA by PCR: NEGATIVE

## 2012-06-14 MED ORDER — CHLORHEXIDINE GLUCONATE 0.12 % MT SOLN
15.0000 mL | Freq: Two times a day (BID) | OROMUCOSAL | Status: DC
  Administered 2012-06-14 – 2012-06-15 (×2): 15 mL via OROMUCOSAL
  Filled 2012-06-14 (×4): qty 15

## 2012-06-14 MED ORDER — INSULIN ASPART 100 UNIT/ML ~~LOC~~ SOLN: 0.0000 [IU] | Freq: Three times a day (TID) | SUBCUTANEOUS | Status: DC

## 2012-06-14 MED ORDER — ACETAMINOPHEN 650 MG RE SUPP
650.0000 mg | Freq: Once | RECTAL | Status: AC
  Administered 2012-06-14: 650 mg via RECTAL
  Filled 2012-06-14: qty 1

## 2012-06-14 MED ORDER — BIOTENE DRY MOUTH MT LIQD
15.0000 mL | Freq: Two times a day (BID) | OROMUCOSAL | Status: DC
  Administered 2012-06-14: 15 mL via OROMUCOSAL

## 2012-06-14 NOTE — Progress Notes (Signed)
Portable EEG completed

## 2012-06-14 NOTE — Progress Notes (Signed)
PROGRESS NOTE  [name] JYN:[id number] DOB: [birth year] DOA: 06/13/2012 PCP: No primary provider on file.  Brief narrative: 79 yr old Female admitted 5.2.14 for Seizure like activity with a Gen Clonic-tonic sz with deviation to the R side.  Was initally very hypertensive and was placed on NIcardipene Gtt after labetalol.  Neurology was consulted and recommended stroke work-up, and an EEG  Past medical history-As per Problem list Chart reviewed as below- Admission 06/09/12 for altered mental status likely 2/2 to Vol depletion and hypercalcemia Admitted 04/02/08 with fall and L hip fracture/CAP   Consultants:  Neurology  Procedures:  CT head 06/13/12  MRI brain 06/14/12  EEG pending  Antibiotics:  none   Subjective  Alert, but eyes closed.  Answers some questions-a little confused.  Family at bedside and states  She had an episode lasting about 2 min yesterday wherein she had a shaking episode with eyes rolled intot the back of her head.  She also had another episode on the ED    Objective    Interim History: NOne  Telemetry: NSR  Objective: Filed Vitals:   06/13/12 2351 06/14/12 0030 06/14/12 0400 06/14/12 0821  BP: 134/54 134/45 118/48 112/41  Pulse:  91    Temp:  98.6 F (37 C) 97.4 F (36.3 C) 98.7 F (37.1 C)  TempSrc:  Oral Oral Oral  Resp: 24 12    Height:  5\' 2"  (1.575 m)    Weight:  56.6 kg (124 lb 12.5 oz)    SpO2: 95% 96%      Intake/Output Summary (Last 24 hours) at 06/14/12 1103 Last data filed at 06/13/12 1924  Gross per 24 hour  Intake   1000 ml  Output    200 ml  Net    800 ml    Exam:  General: alert pleasatn oriented Cardiovascular: s1 s 2no m/r/g Respiratory: clear Abdomen: soft nt/nd  Data Reviewed: Basic Metabolic Panel:  Recent Labs Lab 06/09/12 1810 06/10/12 0650 06/11/12 0405 06/13/12 1539 06/14/12 0557  NA 137  --  139 135 135  K 4.8  --  3.8 3.7 3.1*  CL 100  --  106 101 102  CO2 29  --  27 21 24   GLUCOSE  116*  --  157* 275* 160*  BUN 24*  --  13 12 16   CREATININE 1.06 0.85 0.60 0.58 0.75  CALCIUM 11.9*  --  9.8 9.7 9.3   Liver Function Tests:  Recent Labs Lab 06/09/12 1810 06/13/12 1539  AST 37 44*  ALT 38* 40*  ALKPHOS 60 78  BILITOT 0.4 0.5  PROT 8.2 8.0  ALBUMIN 3.3* 3.0*   No results found for this basename: LIPASE, AMYLASE,  in the last 168 hours  Recent Labs Lab 06/10/12 0010  AMMONIA 23   CBC:  Recent Labs Lab 06/09/12 1810 06/10/12 0650 06/13/12 1539 06/14/12 0557  WBC 10.2 7.1 10.0 18.5*  NEUTROABS 5.7  --  6.9  --   HGB 10.7* 10.3* 11.7* 10.2*  HCT 31.9* 29.6* 32.8* 28.6*  MCV 96.7 96.1 92.7 91.4  PLT 247 211 232 197   Cardiac Enzymes:  Recent Labs Lab 06/10/12 0010 06/10/12 0650 06/13/12 1550  CKTOTAL  --  23  --   TROPONINI <0.30  --  <0.30   BNP: No components found with this basename: POCBNP,  CBG:  Recent Labs Lab 06/12/12 1229 06/13/12 2245 06/14/12 0219 06/14/12 0516 06/14/12 0824  GLUCAP 149* 215* 191* 152* 109*  Recent Results (from the past 240 hour(s))  URINE CULTURE     Status: None   Collection Time    06/10/12  3:51 PM      Result Value Range Status   Specimen Description URINE, CLEAN CATCH   Final   Special Requests Normal   Final   Culture  Setup Time 06/10/2012 22:38   Final   Colony Count 45,000 COLONIES/ML   Final   Culture KLEBSIELLA PNEUMONIAE   Final   Report Status 06/12/2012 FINAL   Final   Organism ID, Bacteria KLEBSIELLA PNEUMONIAE   Final  MRSA PCR SCREENING     Status: None   Collection Time    06/14/12  2:28 AM      Result Value Range Status   MRSA by PCR NEGATIVE  NEGATIVE Final   Comment:            The GeneXpert MRSA Assay (FDA     approved for NASAL specimens     only), is one component of a     comprehensive MRSA colonization     surveillance program. It is not     intended to diagnose MRSA     infection nor to guide or     monitor treatment for     MRSA infections.     Studies:               All Imaging reviewed and is as per above notation   Scheduled Meds: . diclofenac  50 mg Oral BID  . feeding supplement  237 mL Oral BID BM  . heparin  5,000 Units Subcutaneous Q8H  . insulin aspart  0-15 Units Subcutaneous Q4H  . levobunolol  1 drop Both Eyes BID  . losartan  100 mg Oral Daily  . loteprednol  1 drop Both Eyes BID  . simvastatin  20 mg Oral q1800  . sodium chloride  3 mL Intravenous Q12H   Continuous Infusions:    Assessment/Plan: 1. Seizure-MRI brain neg.  EEG pending.   None of the medications she apparently have been on would have caused seizure.  Would attribute this to potentially subclinical aspiration vs Htn urgency?  No indication for long-term AED 2. DM ty 2-Metformin held.  Continue SSI 3. Htn-initally on NIcardipine Gtt-Losartan has been restarted 4. Hld-continue mevacor 40qhs 5. Dysphagia-continue dysphagia diet.  Might need speech input 6. Dementia-mild to moderate 7. Resolved hypercalcemia and AKI-monitor am labs 8. AoCD-stable currnelty- follow am labs  Code Status: DNR confirmed at bedside Family Communication:  Daughter present Disposition Plan:  Inpatient-transfer to tele am   Pleas Koch, MD  [facility] Pager [phone] 06/14/2012, 11:03 AM    LOS: 1 day

## 2012-06-14 NOTE — ED Notes (Signed)
Up on monitor with RN.

## 2012-06-14 NOTE — Progress Notes (Signed)
Have attempted to feed patient her lunch. First patient has no teeth or dentures present. Patient has only taken a few bites then refuses. Patient is incontinent of urine. And patient is asleep and only talks if spoken to.

## 2012-06-14 NOTE — Progress Notes (Signed)
Subjective: No recurrence of seizure activity. Mental status has improved.  Objective: Current vital signs: BP 112/41  Pulse 91  Temp(Src) 98 F (36.7 C) (Oral)  Resp 12  Ht 5\' 2"  (1.575 m)  Wt 56.6 kg (124 lb 12.5 oz)  BMI 22.82 kg/m2  SpO2 96%  Neurologic Exam: Alert and in no acute distress. Patient is well-oriented to time as well as place. Extraocular movements were full and conjugate. She is no facial weakness. Speech was normal. Strength was normal throughout including left upper and lower extremities.  EEG on 06/14/2012 was normal.  Lab Results: Results for orders placed during the hospital encounter of 06/13/12 (from the past 48 hour(s))  CBC WITH DIFFERENTIAL     Status: Abnormal   Collection Time    06/13/12  3:39 PM      Result Value Range   WBC 10.0  4.0 - 10.5 K/uL   RBC 3.54 (*) 3.87 - 5.11 MIL/uL   Hemoglobin 11.7 (*) 12.0 - 15.0 g/dL   HCT 40.9 (*) 81.1 - 91.4 %   MCV 92.7  78.0 - 100.0 fL   MCH 33.1  26.0 - 34.0 pg   MCHC 35.7  30.0 - 36.0 g/dL   RDW 78.2  95.6 - 21.3 %   Platelets 232  150 - 400 K/uL   Neutrophils Relative 70  43 - 77 %   Neutro Abs 6.9  1.7 - 7.7 K/uL   Lymphocytes Relative 22  12 - 46 %   Lymphs Abs 2.2  0.7 - 4.0 K/uL   Monocytes Relative 7  3 - 12 %   Monocytes Absolute 0.7  0.1 - 1.0 K/uL   Eosinophils Relative 1  0 - 5 %   Eosinophils Absolute 0.1  0.0 - 0.7 K/uL   Basophils Relative 0  0 - 1 %   Basophils Absolute 0.0  0.0 - 0.1 K/uL  COMPREHENSIVE METABOLIC PANEL     Status: Abnormal   Collection Time    06/13/12  3:39 PM      Result Value Range   Sodium 135  135 - 145 mEq/L   Potassium 3.7  3.5 - 5.1 mEq/L   Chloride 101  96 - 112 mEq/L   CO2 21  19 - 32 mEq/L   Glucose, Bld 275 (*) 70 - 99 mg/dL   BUN 12  6 - 23 mg/dL   Creatinine, Ser 0.86  0.50 - 1.10 mg/dL   Calcium 9.7  8.4 - 57.8 mg/dL   Total Protein 8.0  6.0 - 8.3 g/dL   Albumin 3.0 (*) 3.5 - 5.2 g/dL   AST 44 (*) 0 - 37 U/L   ALT 40 (*) 0 - 35 U/L    Alkaline Phosphatase 78  39 - 117 U/L   Total Bilirubin 0.5  0.3 - 1.2 mg/dL   GFR calc non Af Amer 80 (*) >90 mL/min   GFR calc Af Amer >90  >90 mL/min   Comment:            The eGFR has been calculated     using the CKD EPI equation.     This calculation has not been     validated in all clinical     situations.     eGFR's persistently     <90 mL/min signify     possible Chronic Kidney Disease.  TROPONIN I     Status: None   Collection Time    06/13/12  3:50 PM  Result Value Range   Troponin I <0.30  <0.30 ng/mL   Comment:            Due to the release kinetics of cTnI,     a negative result within the first hours     of the onset of symptoms does not rule out     myocardial infarction with certainty.     If myocardial infarction is still suspected,     repeat the test at appropriate intervals.  URINALYSIS, ROUTINE W REFLEX MICROSCOPIC     Status: Abnormal   Collection Time    06/13/12  4:50 PM      Result Value Range   Color, Urine YELLOW  YELLOW   APPearance CLEAR  CLEAR   Specific Gravity, Urine 1.015  1.005 - 1.030   pH 7.0  5.0 - 8.0   Glucose, UA >1000 (*) NEGATIVE mg/dL   Hgb urine dipstick NEGATIVE  NEGATIVE   Bilirubin Urine NEGATIVE  NEGATIVE   Ketones, ur NEGATIVE  NEGATIVE mg/dL   Protein, ur NEGATIVE  NEGATIVE mg/dL   Urobilinogen, UA 1.0  0.0 - 1.0 mg/dL   Nitrite NEGATIVE  NEGATIVE   Leukocytes, UA NEGATIVE  NEGATIVE  URINE MICROSCOPIC-ADD ON     Status: None   Collection Time    06/13/12  4:50 PM      Result Value Range   WBC, UA 0-2  <3 WBC/hpf   RBC / HPF 0-2  <3 RBC/hpf  GLUCOSE, CAPILLARY     Status: Abnormal   Collection Time    06/13/12 10:45 PM      Result Value Range   Glucose-Capillary 215 (*) 70 - 99 mg/dL  GLUCOSE, CAPILLARY     Status: Abnormal   Collection Time    06/14/12  2:19 AM      Result Value Range   Glucose-Capillary 191 (*) 70 - 99 mg/dL   Comment 1 Documented in Chart     Comment 2 Notify RN    MRSA PCR SCREENING      Status: None   Collection Time    06/14/12  2:28 AM      Result Value Range   MRSA by PCR NEGATIVE  NEGATIVE   Comment:            The GeneXpert MRSA Assay (FDA     approved for NASAL specimens     only), is one component of a     comprehensive MRSA colonization     surveillance program. It is not     intended to diagnose MRSA     infection nor to guide or     monitor treatment for     MRSA infections.  GLUCOSE, CAPILLARY     Status: Abnormal   Collection Time    06/14/12  5:16 AM      Result Value Range   Glucose-Capillary 152 (*) 70 - 99 mg/dL   Comment 1 Documented in Chart     Comment 2 Notify RN    BASIC METABOLIC PANEL     Status: Abnormal   Collection Time    06/14/12  5:57 AM      Result Value Range   Sodium 135  135 - 145 mEq/L   Potassium 3.1 (*) 3.5 - 5.1 mEq/L   Chloride 102  96 - 112 mEq/L   CO2 24  19 - 32 mEq/L   Glucose, Bld 160 (*) 70 - 99 mg/dL   BUN 16  6 -  23 mg/dL   Creatinine, Ser 1.61  0.50 - 1.10 mg/dL   Calcium 9.3  8.4 - 09.6 mg/dL   GFR calc non Af Amer 73 (*) >90 mL/min   GFR calc Af Amer 85 (*) >90 mL/min   Comment:            The eGFR has been calculated     using the CKD EPI equation.     This calculation has not been     validated in all clinical     situations.     eGFR's persistently     <90 mL/min signify     possible Chronic Kidney Disease.  CBC     Status: Abnormal   Collection Time    06/14/12  5:57 AM      Result Value Range   WBC 18.5 (*) 4.0 - 10.5 K/uL   RBC 3.13 (*) 3.87 - 5.11 MIL/uL   Hemoglobin 10.2 (*) 12.0 - 15.0 g/dL   HCT 04.5 (*) 40.9 - 81.1 %   MCV 91.4  78.0 - 100.0 fL   MCH 32.6  26.0 - 34.0 pg   MCHC 35.7  30.0 - 36.0 g/dL   RDW 91.4  78.2 - 95.6 %   Platelets 197  150 - 400 K/uL  GLUCOSE, CAPILLARY     Status: Abnormal   Collection Time    06/14/12  8:24 AM      Result Value Range   Glucose-Capillary 109 (*) 70 - 99 mg/dL  GLUCOSE, CAPILLARY     Status: Abnormal   Collection Time    06/14/12  12:47 PM      Result Value Range   Glucose-Capillary 108 (*) 70 - 99 mg/dL   Comment 1 Documented in Chart     Comment 2 Notify RN      Studies/Results: Dg Chest 1 View  06/13/2012  *RADIOLOGY REPORT*  Clinical Data: Seizures, hypertension  CHEST - 1 VIEW  Comparison: April 07, 2008.  Findings: Cardiomediastinal silhouette appears normal.  Mild interstitial densities are noted throughout both lungs which are unchanged and most consistent with scarring.  No pleural effusion or pneumothorax is noted.  Bony thorax is intact.  No acute pulmonary disease is noted.  IMPRESSION: No acute cardiopulmonary abnormality seen.   Original Report Authenticated By: Lupita Raider.,  M.D.    Ct Head Wo Contrast  06/13/2012  *RADIOLOGY REPORT*  Clinical Data: Witnessed seizure lasting approximately 2 minutes  CT HEAD WITHOUT CONTRAST  Technique:  Contiguous axial images were obtained from the base of the skull through the vertex without contrast.  Comparison: 06/09/2012  Findings: Motion artifacts, despite repeat imaging. Portions of posterior fossa and middle cranial fossae are incompletely visualized and inadequately assessed.  Generalized atrophy. Normal ventricular morphology. No midline shift or mass effect. Small vessel chronic ischemic changes of deep cerebral white matter. Within limits imposed by motion artifacts, no gross intracranial hemorrhage, mass lesion or evidence of acute infarction identified. No definite extra-axial fluid collections. Bones appear diffusely demineralized. Calvaria intact. Sinuses suboptimally evaluated due to motion, grossly unopacified.  IMPRESSION: Atrophy with small vessel chronic ischemic changes of deep cerebral white matter. No definite acute intracranial abnormalities identified on exam limited by motion artifacts as above.   Original Report Authenticated By: Ulyses Southward, M.D.    [facility] Wo Contrast  06/14/2012  *RADIOLOGY REPORT*  Clinical Data:  Seizure-like activity.   Diabetic hypertensive patient.  MRI BRAIN WITHOUT CONTRAST MRA HEAD WITHOUT CONTRAST  Technique: Multiplanar, multiecho pulse sequences of the brain and surrounding structures were obtained according to standard protocol without intravenous contrast.  Angiographic images of the head were obtained using MRA technique without contrast.  Comparison: 05/02 of 2014 head CT.  No comparison brain MR.  MRI HEAD  Findings:  No acute infarct.  No intracranial hemorrhage.  Prominent small vessel disease type changes.  Global atrophy without hydrocephalus.  No intracranial mass lesion detected on this unenhanced exam.  Cervical spondylotic changes with mild spinal stenosis C2-3, C3-4 and C5-6.  Cervical medullary junction, pituitary region and pineal region unremarkable.  Small right globe.  IMPRESSION: No acute infarct.  Prominent small vessel disease type changes.  Atrophy.  MRA HEAD  Findings: Anterior circulation without medium or large size vessel significant stenosis or occlusion.  Ectatic vertebral arteries and basilar artery.  Left vertebral artery is dominant.  Poor delineation of the AICAs  Mild to moderate narrowing proximal left superior cerebellar artery.  Decreased visualization left posterior cerebral artery branches.  No obvious aneurysm or vascular malformation.  IMPRESSION: Branch vessel atherosclerotic type changes.  Ectatic vertebral arteries and basilar artery.   Original Report Authenticated By: Lacy Duverney, M.D.    Mr Brain Wo Contrast  06/14/2012  *RADIOLOGY REPORT*  Clinical Data:  Seizure-like activity.  Diabetic hypertensive patient.  MRI BRAIN WITHOUT CONTRAST MRA HEAD WITHOUT CONTRAST  Technique: Multiplanar, multiecho pulse sequences of the brain and surrounding structures were obtained according to standard protocol without intravenous contrast.  Angiographic images of the head were obtained using MRA technique without contrast.  Comparison: 05/02 of 2014 head CT.  No comparison brain MR.  MRI  HEAD  Findings:  No acute infarct.  No intracranial hemorrhage.  Prominent small vessel disease type changes.  Global atrophy without hydrocephalus.  No intracranial mass lesion detected on this unenhanced exam.  Cervical spondylotic changes with mild spinal stenosis C2-3, C3-4 and C5-6.  Cervical medullary junction, pituitary region and pineal region unremarkable.  Small right globe.  IMPRESSION: No acute infarct.  Prominent small vessel disease type changes.  Atrophy.  MRA HEAD  Findings: Anterior circulation without medium or large size vessel significant stenosis or occlusion.  Ectatic vertebral arteries and basilar artery.  Left vertebral artery is dominant.  Poor delineation of the AICAs  Mild to moderate narrowing proximal left superior cerebellar artery.  Decreased visualization left posterior cerebral artery branches.  No obvious aneurysm or vascular malformation.  IMPRESSION: Branch vessel atherosclerotic type changes.  Ectatic vertebral arteries and basilar artery.   Original Report Authenticated By: Lacy Duverney, M.D.     Medications: I have reviewed the patient's current medications.  Assessment/Plan: Single generalized seizure of unclear etiology. MRI showed no signs of acute intracranial abnormality including no signs of stroke nor hemorrhage or mass lesion. Patient's mental status is normal at this point and she no longer has apparent left-sided weakness. Changes seen on examination yesterday will most likely postictal and have resolved. EEG was normal with no indication of increased risk for recurrent seizure activity.  No further neurodiagnostic studies are indicated at this point. Recommend no anticonvulsant medication for now.  C.R. Roseanne Reno, MD [facility] [phone]  06/14/2012  3:45 PM

## 2012-06-15 LAB — COMPREHENSIVE METABOLIC PANEL
AST: 24 U/L (ref 0–37)
BUN: 23 mg/dL (ref 6–23)
CO2: 25 mEq/L (ref 19–32)
Chloride: 106 mEq/L (ref 96–112)
Creatinine, Ser: 1.05 mg/dL (ref 0.50–1.10)
GFR calc non Af Amer: 46 mL/min — ABNORMAL LOW (ref >90)
Total Bilirubin: 0.4 mg/dL (ref 0.3–1.2)

## 2012-06-15 LAB — CBC
MCH: 32.4 pg (ref 26.0–34.0)
MCHC: 35.1 g/dL (ref 30.0–36.0)
Platelets: 196 10*3/uL (ref 150–400)
RBC: 3.09 MIL/uL — ABNORMAL LOW (ref 3.87–5.11)
RDW: 13.2 % (ref 11.5–15.5)

## 2012-06-15 LAB — GLUCOSE, CAPILLARY: Glucose-Capillary: 100 mg/dL — ABNORMAL HIGH (ref 70–99)

## 2012-06-15 NOTE — Procedures (Signed)
EEG NUMBER:  [id number].  INDICATION FOR STUDY:  An 79 year old lady with new onset generalized seizure activity on Jun 13, 2012.  DESCRIPTION:  This is a routine EEG recording performed during wakefulness and during light sleep.  Predominant background activity during wakefulness consisted of 8 Hz, symmetrical alpha rhythm, which attenuated well with eye opening.  During drowsiness and light sleep, there was slowing of background activity diffusely and symmetrically with irregular delta and theta activity.  Stage II of sleep was not achieved.  Photic stimulation was not performed.  Hyperventilation was not performed.  No epileptiform discharges were recorded.  There were no areas of disproportionate focal slowing.  INTERPRETATION:  This is a normal EEG recording during wakefulness and during drowsiness and light sleep for patient of this age.  No evidence of an epileptic disorder was demonstrated.     Noel Christmas, MD    [id number] D:  06/14/2012 12:52:15  T:  06/15/2012 00:53:25  Job #:  [id number]

## 2012-06-15 NOTE — Care Management Note (Signed)
    Page 1 of 2   06/15/2012     9:40:16 AM   CARE MANAGEMENT NOTE 06/15/2012  Patient:  [name], [name]   Account Number:  [id number]  Date Initiated:  06/15/2012  Documentation initiated by:  GRAVES-BIGELOW,Khalani Novoa  Subjective/Objective Assessment:   Pt admitted with seizures and plan to return home with family. Daughter Gaspar Cola stated she is in the process of getting a hoyer lift and hospital bed.     Action/Plan:   Pt will need HH services RN, PT, Aide. She was previously active with [facility]. Family agreeable to services. SOC to begin within 24-48 hours post d/c. Ambulance to take pt home. CM didi Call CSW Marylu Lund to make her aware.   Anticipated DC Date:  06/15/2012   Anticipated DC Plan:  HOME W HOME HEALTH SERVICES      DC Planning Services  CM consult      [facility] Choice  Resumption Of Svcs/PTA Provider   Choice offered to / List presented to:  C-4 Adult Children        HH arranged  HH-1 RN  HH-10 DISEASE MANAGEMENT  HH-2 PT  HH-4 NURSE'S AIDE      HH agency  [facility]   Status of service:  Completed, signed off Medicare Important Message given?   (If response is "NO", the following Medicare IM given date fields will be blank) Date Medicare IM given:   Date Additional Medicare IM given:    Discharge Disposition:  HOME W HOME HEALTH SERVICES  Per UR Regulation:    If discussed at Long Length of Stay Meetings, dates discussed:    Comments:

## 2012-06-15 NOTE — Progress Notes (Signed)
Patient has been discharged home via ambulance today. Daughter and caregiver present and aware of status. Patient is excited about going home. Reviewed D/C instructions with the daughter and also returned the patient's eye drops to her. IV's have been removed. All questions and concerns have been addressed.

## 2012-06-15 NOTE — Discharge Summary (Signed)
Physician Discharge Summary  [name] ZOX:[id number] DOB: 1922/08/14 DOA: 06/13/2012  PCP: No primary provider on file.  Admit date: 06/13/2012 Discharge date: 06/15/2012  Time spent: 25 minutes  Recommendations for Outpatient Follow-up:  1. Follow up with PCP in 1-2 days 2. Monitor blood pressures 3. Resume home health services on d/c home   Discharge Diagnoses:  Principal Problem:   Seizure Active Problems:   Type 2 diabetes mellitus   Unspecified essential hypertension   Discharge Condition: Fair  Diet recommendation: Diabetic  Filed Weights   06/14/12 0030  Weight: 56.6 kg (124 lb 12.5 oz)    History of present illness:  79 yr old Female admitted 5.2.14 for Seizure like activity with a Gen Clonic-tonic sz with deviation to the R side. Was initally very hypertensive and was placed on NIcardipene Gtt after labetalol. Neurology was consulted and recommended stroke work-up, and an EEG Patient was admitted and a workup ensued as below.   Hospital Course:  1. Seizure-MRI brain neg. EEG pending. None of the medications she apparently have been on would have caused seizure. Unclear if subclinical aspiration vs Htn urgency? No indication for long-term AED-neurologist Dr. Roseanne Reno performed an EEG which was read as negative for any seizure like activity. Patient was cleared from neurology standpoint for discharge home 2. DM ty 2-Metformin held. Continue SSI-sugars well controlled in the hospital. No evidence of hypoglycemia. Metformin was restarted on extra 3. Htn-initally on NIcardipine Gtt-Losartan has been restarted at home dose and pressures have been 4. Hld-continue mevacor 40qhs 5. Dysphagia-continue dysphagia diet. She is edentulous and needs to be supervised for eating and needs to have her dentures and 6. Dementia-mild to moderate 7. Resolved hypercalcemia and AKI-monitor am labs 8. AoCD-stable currnelty- follow am labs   Consultants:  Neurology Procedures:  CT  head 06/13/12  MRI brain 06/14/12  EEG pending Antibiotics:  none   Discharge Exam: Filed Vitals:   06/14/12 1600 06/14/12 2000 06/15/12 0000 06/15/12 0400  BP: 144/41 155/49 155/55 137/45  Pulse:  58 55 52  Temp: 97.9 F (36.6 C) 98.3 F (36.8 C) 97.2 F (36.2 C) 98 F (36.7 C)  TempSrc: Axillary Oral Oral Oral  Resp:  23 20 20   Height:      Weight:      SpO2:  97% 97% 99%   Alert but not oriented to place or year or date. Can tell me some other simple oriented information. No current specific complaints other  General: Pleasant African American female, right eye is cloudy and has no vision by direct confrontation Cardiovascular: S1-S2 no murmur or gallop, she slightly bradycardic into the 50s with no pauses Respiratory: Clinically clear  Discharge Instructions  Discharge Orders   Future Orders Complete By Expires     Call MD for:  extreme fatigue  As directed     Call MD for:  hives  As directed     Call MD for:  persistant nausea and vomiting  As directed     Call MD for:  redness, tenderness, or signs of infection (pain, swelling, redness, odor or green/yellow discharge around incision site)  As directed     Call MD for:  temperature >100.4  As directed     Diet - low sodium heart healthy  As directed     Increase activity slowly  As directed         Medication List    TAKE these medications       diclofenac 50 MG  EC tablet  Commonly known as:  VOLTAREN  Take 50 mg by mouth 2 (two) times daily.     feeding supplement Liqd  Take 237 mLs by mouth 2 (two) times daily between meals.     fluorometholone 0.1 % ophthalmic suspension  Commonly known as:  EFLONE  Place 1 drop into both eyes 3 (three) times daily.     levobunolol 0.5 % ophthalmic solution  Commonly known as:  BETAGAN  Place 1 drop into both eyes 2 (two) times daily.     losartan 100 MG tablet  Commonly known as:  COZAAR  Take 100 mg by mouth daily.     loteprednol 0.5 % ophthalmic suspension   Commonly known as:  LOTEMAX  Place 1 drop into both eyes 2 (two) times daily.     lovastatin 40 MG tablet  Commonly known as:  MEVACOR  Take 40 mg by mouth at bedtime.     metFORMIN 500 MG (MOD) 24 hr tablet  Commonly known as:  GLUMETZA  Take 500-1,000 mg by mouth 2 (two) times daily with a meal. Takes 1 tablet(500mg ) every morning  And 2 tablets(1000mg ) every evening     polyethylene glycol packet  Commonly known as:  MIRALAX / GLYCOLAX  Take 17 g by mouth daily.       No Known Allergies    The results of significant diagnostics from this hospitalization (including imaging, microbiology, ancillary and laboratory) are listed below for reference.    Significant Diagnostic Studies: Dg Chest 1 View  06/13/2012  *RADIOLOGY REPORT*  Clinical Data: Seizures, hypertension  CHEST - 1 VIEW  Comparison: April 07, 2008.  Findings: Cardiomediastinal silhouette appears normal.  Mild interstitial densities are noted throughout both lungs which are unchanged and most consistent with scarring.  No pleural effusion or pneumothorax is noted.  Bony thorax is intact.  No acute pulmonary disease is noted.  IMPRESSION: No acute cardiopulmonary abnormality seen.   Original Report Authenticated By: Lupita Raider.,  M.D.    Ct Head Wo Contrast  06/13/2012  *RADIOLOGY REPORT*  Clinical Data: Witnessed seizure lasting approximately 2 minutes  CT HEAD WITHOUT CONTRAST  Technique:  Contiguous axial images were obtained from the base of the skull through the vertex without contrast.  Comparison: 06/09/2012  Findings: Motion artifacts, despite repeat imaging. Portions of posterior fossa and middle cranial fossae are incompletely visualized and inadequately assessed.  Generalized atrophy. Normal ventricular morphology. No midline shift or mass effect. Small vessel chronic ischemic changes of deep cerebral white matter. Within limits imposed by motion artifacts, no gross intracranial hemorrhage, mass lesion or  evidence of acute infarction identified. No definite extra-axial fluid collections. Bones appear diffusely demineralized. Calvaria intact. Sinuses suboptimally evaluated due to motion, grossly unopacified.  IMPRESSION: Atrophy with small vessel chronic ischemic changes of deep cerebral white matter. No definite acute intracranial abnormalities identified on exam limited by motion artifacts as above.   Original Report Authenticated By: Ulyses Southward, M.D.    Ct Head Wo Contrast  06/09/2012  *RADIOLOGY REPORT*  Clinical Data: Weakness.  Multiple falls.  Not eating or drinking.  CT HEAD WITHOUT CONTRAST  Technique:  Contiguous axial images were obtained from the base of the skull through the vertex without contrast.  Comparison: 04/02/2008  Findings: Mild diffuse cerebral atrophy.  Mild ventricular dilatation consistent with central atrophy.  Low attenuation changes in the periventricular white matter consistent with small vessel ischemia.  No mass effect or midline shift.  No abnormal extra-axial  fluid collections.  Gray-white matter junctions are distinct.  Basal cisterns are not effaced.  No depressed skull fractures.  Visualized paranasal sinuses and mastoid air cells are not opacified.  No significant changes since previous study.  IMPRESSION: No acute intracranial abnormalities.  Mild atrophy and small vessel ischemic changes.   Original Report Authenticated By: Burman Nieves, M.D.    Mr [facility] Wo Contrast  06/14/2012  *RADIOLOGY REPORT*  Clinical Data:  Seizure-like activity.  Diabetic hypertensive patient.  MRI BRAIN WITHOUT CONTRAST MRA HEAD WITHOUT CONTRAST  Technique: Multiplanar, multiecho pulse sequences of the brain and surrounding structures were obtained according to standard protocol without intravenous contrast.  Angiographic images of the head were obtained using MRA technique without contrast.  Comparison: 05/02 of 2014 head CT.  No comparison brain MR.  MRI HEAD  Findings:  No acute infarct.   No intracranial hemorrhage.  Prominent small vessel disease type changes.  Global atrophy without hydrocephalus.  No intracranial mass lesion detected on this unenhanced exam.  Cervical spondylotic changes with mild spinal stenosis C2-3, C3-4 and C5-6.  Cervical medullary junction, pituitary region and pineal region unremarkable.  Small right globe.  IMPRESSION: No acute infarct.  Prominent small vessel disease type changes.  Atrophy.  MRA HEAD  Findings: Anterior circulation without medium or large size vessel significant stenosis or occlusion.  Ectatic vertebral arteries and basilar artery.  Left vertebral artery is dominant.  Poor delineation of the AICAs  Mild to moderate narrowing proximal left superior cerebellar artery.  Decreased visualization left posterior cerebral artery branches.  No obvious aneurysm or vascular malformation.  IMPRESSION: Branch vessel atherosclerotic type changes.  Ectatic vertebral arteries and basilar artery.   Original Report Authenticated By: Lacy Duverney, M.D.    Mr Brain Wo Contrast  06/14/2012  *RADIOLOGY REPORT*  Clinical Data:  Seizure-like activity.  Diabetic hypertensive patient.  MRI BRAIN WITHOUT CONTRAST MRA HEAD WITHOUT CONTRAST  Technique: Multiplanar, multiecho pulse sequences of the brain and surrounding structures were obtained according to standard protocol without intravenous contrast.  Angiographic images of the head were obtained using MRA technique without contrast.  Comparison: 05/02 of 2014 head CT.  No comparison brain MR.  MRI HEAD  Findings:  No acute infarct.  No intracranial hemorrhage.  Prominent small vessel disease type changes.  Global atrophy without hydrocephalus.  No intracranial mass lesion detected on this unenhanced exam.  Cervical spondylotic changes with mild spinal stenosis C2-3, C3-4 and C5-6.  Cervical medullary junction, pituitary region and pineal region unremarkable.  Small right globe.  IMPRESSION: No acute infarct.  Prominent small  vessel disease type changes.  Atrophy.  MRA HEAD  Findings: Anterior circulation without medium or large size vessel significant stenosis or occlusion.  Ectatic vertebral arteries and basilar artery.  Left vertebral artery is dominant.  Poor delineation of the AICAs  Mild to moderate narrowing proximal left superior cerebellar artery.  Decreased visualization left posterior cerebral artery branches.  No obvious aneurysm or vascular malformation.  IMPRESSION: Branch vessel atherosclerotic type changes.  Ectatic vertebral arteries and basilar artery.   Original Report Authenticated By: Lacy Duverney, M.D.     Microbiology: Recent Results (from the past 240 hour(s))  URINE CULTURE     Status: None   Collection Time    06/10/12  3:51 PM      Result Value Range Status   Specimen Description URINE, CLEAN CATCH   Final   Special Requests Normal   Final   Culture  Setup Time  06/10/2012 22:38   Final   Colony Count 45,000 COLONIES/ML   Final   Culture KLEBSIELLA PNEUMONIAE   Final   Report Status 06/12/2012 FINAL   Final   Organism ID, Bacteria KLEBSIELLA PNEUMONIAE   Final  MRSA PCR SCREENING     Status: None   Collection Time    06/14/12  2:28 AM      Result Value Range Status   MRSA by PCR NEGATIVE  NEGATIVE Final   Comment:            The GeneXpert MRSA Assay (FDA     approved for NASAL specimens     only), is one component of a     comprehensive MRSA colonization     surveillance program. It is not     intended to diagnose MRSA     infection nor to guide or     monitor treatment for     MRSA infections.     Labs: Basic Metabolic Panel:  Recent Labs Lab 06/09/12 1810 06/10/12 0650 06/11/12 0405 06/13/12 1539 06/14/12 0557 06/15/12 0505  NA 137  --  139 135 135 140  K 4.8  --  3.8 3.7 3.1* 3.1*  CL 100  --  106 101 102 106  CO2 29  --  27 21 24 25   GLUCOSE 116*  --  157* 275* 160* 103*  BUN 24*  --  13 12 16 23   CREATININE 1.06 0.85 0.60 0.58 0.75 1.05  CALCIUM 11.9*  --   9.8 9.7 9.3 9.3   Liver Function Tests:  Recent Labs Lab 06/09/12 1810 06/13/12 1539 06/15/12 0505  AST 37 44* 24  ALT 38* 40* 26  ALKPHOS 60 78 66  BILITOT 0.4 0.5 0.4  PROT 8.2 8.0 6.9  ALBUMIN 3.3* 3.0* 2.5*   No results found for this basename: LIPASE, AMYLASE,  in the last 168 hours  Recent Labs Lab 06/10/12 0010  AMMONIA 23   CBC:  Recent Labs Lab 06/09/12 1810 06/10/12 0650 06/13/12 1539 06/14/12 0557 06/15/12 0505  WBC 10.2 7.1 10.0 18.5* 10.9*  NEUTROABS 5.7  --  6.9  --   --   HGB 10.7* 10.3* 11.7* 10.2* 10.0*  HCT 31.9* 29.6* 32.8* 28.6* 28.5*  MCV 96.7 96.1 92.7 91.4 92.2  PLT 247 211 232 197 196   Cardiac Enzymes:  Recent Labs Lab 06/10/12 0010 06/10/12 0650 06/13/12 1550  CKTOTAL  --  23  --   TROPONINI <0.30  --  <0.30   BNP: BNP (last 3 results) No results found for this basename: PROBNP,  in the last 8760 hours CBG:  Recent Labs Lab 06/14/12 0516 06/14/12 0824 06/14/12 1247 06/14/12 1619 06/15/12 0743  GLUCAP 152* 109* 108* 103* 100*       Signed:  Rhetta Mura  [facility] 06/15/2012, 8:03 AM

## 2013-01-20 ENCOUNTER — Other Ambulatory Visit: Payer: Self-pay | Admitting: Physician Assistant

## 2013-01-20 DIAGNOSIS — G894 Chronic pain syndrome: Secondary | ICD-10-CM

## 2013-02-16 ENCOUNTER — Encounter: Payer: Self-pay | Admitting: Internal Medicine

## 2013-02-19 ENCOUNTER — Encounter: Payer: Self-pay | Admitting: Internal Medicine

## 2013-02-19 ENCOUNTER — Ambulatory Visit (INDEPENDENT_AMBULATORY_CARE_PROVIDER_SITE_OTHER): Payer: Medicare Other | Admitting: Internal Medicine

## 2013-02-19 VITALS — BP 148/84 | HR 56 | Temp 96.8°F | Resp 16 | Ht 62.5 in | Wt 134.0 lb

## 2013-02-19 DIAGNOSIS — E782 Mixed hyperlipidemia: Secondary | ICD-10-CM

## 2013-02-19 DIAGNOSIS — E559 Vitamin D deficiency, unspecified: Secondary | ICD-10-CM

## 2013-02-19 DIAGNOSIS — Z79899 Other long term (current) drug therapy: Secondary | ICD-10-CM

## 2013-02-19 DIAGNOSIS — E1129 Type 2 diabetes mellitus with other diabetic kidney complication: Secondary | ICD-10-CM

## 2013-02-19 DIAGNOSIS — Z1212 Encounter for screening for malignant neoplasm of rectum: Secondary | ICD-10-CM

## 2013-02-19 DIAGNOSIS — I10 Essential (primary) hypertension: Secondary | ICD-10-CM

## 2013-02-19 DIAGNOSIS — Z0000 Encounter for general adult medical examination without abnormal findings: Secondary | ICD-10-CM

## 2013-02-19 LAB — CBC WITH DIFFERENTIAL/PLATELET
Basophils Absolute: 0 10*3/uL (ref 0.0–0.1)
Basophils Relative: 0 % (ref 0–1)
EOS ABS: 0.1 10*3/uL (ref 0.0–0.7)
EOS PCT: 2 % (ref 0–5)
HCT: 33 % — ABNORMAL LOW (ref 36.0–46.0)
Hemoglobin: 11.6 g/dL — ABNORMAL LOW (ref 12.0–15.0)
LYMPHS ABS: 2.4 10*3/uL (ref 0.7–4.0)
Lymphocytes Relative: 47 % — ABNORMAL HIGH (ref 12–46)
MCH: 32.9 pg (ref 26.0–34.0)
MCHC: 35.2 g/dL (ref 30.0–36.0)
MCV: 93.5 fL (ref 78.0–100.0)
MONO ABS: 0.5 10*3/uL (ref 0.1–1.0)
Monocytes Relative: 9 % (ref 3–12)
Neutro Abs: 2.2 10*3/uL (ref 1.7–7.7)
Neutrophils Relative %: 42 % — ABNORMAL LOW (ref 43–77)
PLATELETS: 170 10*3/uL (ref 150–400)
RBC: 3.53 MIL/uL — AB (ref 3.87–5.11)
RDW: 14 % (ref 11.5–15.5)
WBC: 5.2 10*3/uL (ref 4.0–10.5)

## 2013-02-19 LAB — BASIC METABOLIC PANEL WITH GFR
BUN: 24 mg/dL — ABNORMAL HIGH (ref 6–23)
CALCIUM: 9.8 mg/dL (ref 8.4–10.5)
CO2: 26 meq/L (ref 19–32)
CREATININE: 0.84 mg/dL (ref 0.50–1.10)
Chloride: 103 mEq/L (ref 96–112)
GFR, Est African American: 72 mL/min
GFR, Est Non African American: 62 mL/min
GLUCOSE: 132 mg/dL — AB (ref 70–99)
Potassium: 4.5 mEq/L (ref 3.5–5.3)
SODIUM: 138 meq/L (ref 135–145)

## 2013-02-19 LAB — HEPATIC FUNCTION PANEL
ALK PHOS: 67 U/L (ref 39–117)
ALT: 32 U/L (ref 0–35)
AST: 35 U/L (ref 0–37)
Albumin: 3.9 g/dL (ref 3.5–5.2)
BILIRUBIN DIRECT: 0.1 mg/dL (ref 0.0–0.3)
BILIRUBIN TOTAL: 0.6 mg/dL (ref 0.3–1.2)
Indirect Bilirubin: 0.5 mg/dL (ref 0.0–0.9)
Total Protein: 8.4 g/dL — ABNORMAL HIGH (ref 6.0–8.3)

## 2013-02-19 LAB — HEMOGLOBIN A1C
Hgb A1c MFr Bld: 6.1 % — ABNORMAL HIGH (ref <5.7)
Mean Plasma Glucose: 128 mg/dL — ABNORMAL HIGH (ref <117)

## 2013-02-19 MED ORDER — MIRTAZAPINE 30 MG PO TABS: ORAL_TABLET | ORAL | Status: DC

## 2013-02-19 NOTE — Progress Notes (Signed)
Patient ID: [name], female   DOB: 1922-03-26, 80 y.o.   MRN: [id number]  Annual Screening Comprehensive Examination  This very nice 80 y.o. WWF presents for complete physical.  Patient has been followed for HTN, Diabetes, Hyperlipidemia, and Vitamin D Deficiency.    HTN predates many years. Patient's BP has been controlled at home. Today's BP: 148/84 mmHg.  Last BUN/Ctreat 20/0.81 and calc GFR 65 consistent with mild renal insufficiency. Patient denies any cardiac symptoms as chest pain, palpitations, shortness of breath, dizziness or ankle swelling.   Patient's hyperlipidemia is controlled with diet Last chol 193, trig 63, HDL 48 and LDL 104 -at goal & off meds. Patient denies myalgias or other medication SE's   Patient has Diabetes with last A1c 6.5% in Sept. Patient is off Metformin due to recurrent hypoglycemic reactions . Patient denies reactive hypoglycemic symptoms, visual blurring, diabetic polys, or paresthesias.   Finally, patient has history of Vitamin D Deficiency with last vitamin D 77 (was 47 in 2013).     Medication List       diclofenac 50 MG EC tablet  Commonly known as:  VOLTAREN  take 1 tablet by mouth twice a day     feeding supplement (ENSURE COMPLETE) Liqd  Take 237 mLs by mouth 2 (two) times daily between meals.     fluorometholone 0.1 % ophthalmic suspension  Commonly known as:  EFLONE  Place 1 drop into both eyes 3 (three) times daily.     levobunolol 0.5 % ophthalmic solution  Commonly known as:  BETAGAN  Place 1 drop into both eyes 2 (two) times daily.     losartan 100 MG tablet  Commonly known as:  COZAAR  Take 100 mg by mouth daily.     loteprednol 0.5 % ophthalmic suspension  Commonly known as:  LOTEMAX  Place 1 drop into both eyes 2 (two) times daily.         No Known Allergies  Past Medical History  Diagnosis Date  . Arthritis   . Hypertension   . Diabetes mellitus without complication   . Hepatitis C   . Hyperlipidemia    . Thyroid disease   . Glaucoma   . Vitamin D deficiency   . DJD (degenerative joint disease)   . Allergy     Past Surgical History  Procedure Laterality Date  . Cataract extraction Right     Family History  Problem Relation Age of Onset  . Hypertension Mother   . Heart attack Mother   . Hypertension Father   . Heart attack Father     History  Substance Use Topics  . Smoking status: Never Smoker   . Smokeless tobacco: Not on file  . 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  Cardio: Denies chest pain, palpitations, irregular heartbeat, syncope, dyspnea, diaphoresis, orthopnea, PND, claudication, edema Respiratory: denies cough, dyspnea, DOE, pleurisy, hoarseness, laryngitis, wheezing.  Gastrointestinal: Denies dysphagia, heartburn, reflux, water brash, pain, cramps, nausea, vomiting, bloating, diarrhea, constipation, hematemesis, melena, hematochezia, jaundice, hemorrhoids Genitourinary: Denies dysuria, frequency, urgency, nocturia, hesitancy, discharge, hematuria, flank pain Breast:Breast lumps, nipple discharge, bleeding.  Musculoskeletal: Denies arthralgia, myalgia, stiffness, Jt. Swelling, pain, limp, and strain/sprain. Skin: Denies puritis, rash, hives, warts, acne, eczema, changing in skin lesion Neuro: No weakness, tremor, incoordination,  spasms, paresthesia, pain Psychiatric: Denies confusion, memory loss, sensory loss Endocrine: Denies change in weight, skin, hair change, nocturia, and paresthesia, diabetic polys, visual blurring, hyper / hypo glycemic episodes.  Heme/Lymph: No excessive bleeding, bruising, enlarged lymph nodes.  Filed Vitals:   02/19/13 1408  BP: 148/84  Pulse: 56  Temp: 96.8 F (36 C)  Resp: 16     Estimated body mass index is 24.1 kg/(m^2) as calculated from the following:   Height as of this encounter: 5' 2.5" (1.588 m).   Weight as of this encounter: 134 lb (60.782 kg).  Physical Exam General Appearance: Well nourished, in no apparent distress. Eyes: Cloudy Rt cornea obscuring Rt pupil w/o red reflex & mil clouding of Lt cornea with meiotic pupil & w/o light reaction. Fundus not visualized. VA at hand motion perception. Sinuses: No frontal/maxillary tenderness ENT/Mouth: EACs patent / TMs  nl. Nares clear without erythema, swelling, mucoid exudates. Oral hygiene is good. No erythema, swelling, or exudate. Tongue normal, non-obstructing. Tonsils not swollen or erythematous. Hearing normal.  Neck: Supple, thyroid normal. No bruits, nodes or JVD. Respiratory: Respiratory effort normal.  BS equal and clear bilateral without rales, rhonci, wheezing or stridor. Cardio: Heart sounds are distant with regular rate and rhythm and no murmurs, rubs or gallops. Peripheral pulses are 1+ and equal bilaterally without edema. No aortic or femoral bruits. Chest: symmetric with normal excursions and percussion. Breasts: Symmetric, without lumps, nipple discharge, retractions, or fibrocystic changes.  Abdomen: Sl protuberant and doughy - soft, with bowel sounds. Nontender, no guarding, rebound, hernias, masses, or organomegaly.  Lymphatics: Non tender without lymphadenopathy.  Musculoskeletal: Full ROM all peripheral extremities,  w/ joint stability, equal strength, and gait stabilized with a walker. Skin: Warm and dry without rashes, lesions, cyanosis, clubbing or  ecchymosis.  Neuro: Cranial nerves intact, reflexes equal bilaterally. Normal muscle tone, no cerebellar symptoms. Sensation intact.  Pysch: Awake and oriented X 2 (not time), normal affect,  With limited Insight and Judgment.   Assessment and Plan  1. Annual Screening Examination 2. Hypertension  3. Hyperlipidemia- defer labs with  decision not to treat in past by daughter 4. T2 NIDDM, diet  5. Vitamin D Deficiency  Continue prudent diet as discussed, weight control, BP monitoring, regular exercise, and medications. Discussed med's effects and SE's. Screening labs and tests as requested with regular follow-up as recommended. Note: Daughter declined MGM.

## 2013-02-19 NOTE — Patient Instructions (Signed)

## 2013-02-20 LAB — INSULIN, FASTING: Insulin fasting, serum: 27 u[IU]/mL (ref 3–28)

## 2013-02-20 LAB — MICROALBUMIN / CREATININE URINE RATIO
CREATININE, URINE: 61.9 mg/dL
MICROALB UR: 3.36 mg/dL — AB (ref 0.00–1.89)
Microalb Creat Ratio: 54.3 mg/g — ABNORMAL HIGH (ref 0.0–30.0)

## 2013-02-20 LAB — VITAMIN D 25 HYDROXY (VIT D DEFICIENCY, FRACTURES): Vit D, 25-Hydroxy: 77 ng/mL (ref 30–89)

## 2013-02-20 LAB — TSH: TSH: 4.674 u[IU]/mL — ABNORMAL HIGH (ref 0.350–4.500)

## 2013-02-22 ENCOUNTER — Other Ambulatory Visit: Payer: Self-pay | Admitting: Internal Medicine

## 2013-02-22 DIAGNOSIS — E039 Hypothyroidism, unspecified: Secondary | ICD-10-CM

## 2013-02-22 MED ORDER — LEVOTHYROXINE SODIUM 50 MCG PO TABS: 50.0000 ug | ORAL_TABLET | Freq: Every day | ORAL | Status: DC

## 2013-02-23 ENCOUNTER — Telehealth: Payer: Self-pay | Admitting: *Deleted

## 2013-02-23 NOTE — Telephone Encounter (Signed)
Message copied by Reggy EyeEVANS, Aki Burdin C on Mon Feb 23, 2013 10:31 AM ------      Message from: Lucky CowboyMCKEOWN, WILLIAM      Created: Sun Feb 22, 2013  4:16 PM       A1c 6.1 % - still in diabetic range - and losing weight can cure - but if stays high may need to start back on  a lower dose of diabetic meds.      Thyroid sl low - may hinder weight loss  - will send Rx to drug store for thyroid med -       Vit D is 4877 - great - all other labs are ok             Need NV in 1  Month to recheck thyroid ------

## 2013-02-24 ENCOUNTER — Telehealth: Payer: Self-pay | Admitting: *Deleted

## 2013-02-24 NOTE — Telephone Encounter (Signed)
Message copied by Reggy EyeEVANS, Delorean Knutzen C on Tue Feb 24, 2013 12:31 PM ------      Message from: Lucky CowboyMCKEOWN, WILLIAM      Created: Sun Feb 22, 2013  4:16 PM       A1c 6.1 % - still in diabetic range - and losing weight can cure - but if stays high may need to start back on  a lower dose of diabetic meds.      Thyroid sl low - may hinder weight loss  - will send Rx to drug store for thyroid med -       Vit D is 3577 - great - all other labs are ok             Need NV in 1  Month to recheck thyroid ------

## 2013-03-27 ENCOUNTER — Ambulatory Visit: Payer: Self-pay

## 2013-04-26 ENCOUNTER — Other Ambulatory Visit: Payer: Self-pay | Admitting: Emergency Medicine

## 2013-04-26 DIAGNOSIS — G894 Chronic pain syndrome: Secondary | ICD-10-CM

## 2013-04-26 MED ORDER — DICLOFENAC SODIUM 50 MG PO TBEC: DELAYED_RELEASE_TABLET | ORAL | Status: DC

## 2013-05-20 ENCOUNTER — Encounter: Payer: Self-pay | Admitting: Emergency Medicine

## 2013-05-20 ENCOUNTER — Ambulatory Visit (INDEPENDENT_AMBULATORY_CARE_PROVIDER_SITE_OTHER): Payer: Medicare Other | Admitting: Emergency Medicine

## 2013-05-20 VITALS — BP 124/70 | HR 56 | Temp 98.2°F | Resp 16 | Ht 63.0 in | Wt 138.0 lb

## 2013-05-20 DIAGNOSIS — R7309 Other abnormal glucose: Secondary | ICD-10-CM

## 2013-05-20 DIAGNOSIS — E782 Mixed hyperlipidemia: Secondary | ICD-10-CM

## 2013-05-20 DIAGNOSIS — E039 Hypothyroidism, unspecified: Secondary | ICD-10-CM

## 2013-05-20 DIAGNOSIS — I10 Essential (primary) hypertension: Secondary | ICD-10-CM

## 2013-05-20 LAB — CBC WITH DIFFERENTIAL/PLATELET
Basophils Absolute: 0 10*3/uL (ref 0.0–0.1)
Basophils Relative: 0 % (ref 0–1)
Eosinophils Absolute: 0.1 10*3/uL (ref 0.0–0.7)
Eosinophils Relative: 3 % (ref 0–5)
HEMATOCRIT: 33.3 % — AB (ref 36.0–46.0)
HEMOGLOBIN: 11.3 g/dL — AB (ref 12.0–15.0)
LYMPHS PCT: 52 % — AB (ref 12–46)
Lymphs Abs: 2.4 10*3/uL (ref 0.7–4.0)
MCH: 32.6 pg (ref 26.0–34.0)
MCHC: 33.9 g/dL (ref 30.0–36.0)
MCV: 96 fL (ref 78.0–100.0)
MONO ABS: 0.4 10*3/uL (ref 0.1–1.0)
MONOS PCT: 9 % (ref 3–12)
NEUTROS ABS: 1.7 10*3/uL (ref 1.7–7.7)
NEUTROS PCT: 36 % — AB (ref 43–77)
Platelets: 183 10*3/uL (ref 150–400)
RBC: 3.47 MIL/uL — AB (ref 3.87–5.11)
RDW: 14.5 % (ref 11.5–15.5)
WBC: 4.7 10*3/uL (ref 4.0–10.5)

## 2013-05-20 NOTE — Patient Instructions (Signed)
Constipation, Adult  Constipation is when a person:  · Poops (bowel movement) less than 3 times a week.  · Has a hard time pooping.  · Has poop that is dry, hard, or bigger than normal.  HOME CARE   · Eat more fiber, such as fruits, vegetables, whole grains like brown rice, and beans.  · Eat less fatty foods and sugar. This includes French fries, hamburgers, cookies, candy, and soda.  · If you are not getting enough fiber from food, take products with added fiber in them (supplements).  · Drink enough fluid to keep your pee (urine) clear or pale yellow.  · Go to the restroom when you feel like you need to poop. Do not hold it.  · Only take medicine as told by your doctor. Do not take medicines that help you poop (laxatives) without talking to your doctor first.  · Exercise on a regular basis, or as told by your doctor.  GET HELP RIGHT AWAY IF:   · You have bright red blood in your poop (stool).  · Your constipation lasts more than 4 days or gets worse.  · You have belly (abdomen) or butt (rectal) pain.  · You have thin poop (as thin as a pencil).  · You lose weight, and it cannot be explained.  MAKE SURE YOU:   · Understand these instructions.  · Will watch your condition.  · Will get help right away if you are not doing well or get worse.  Document Released: 07/18/2007 Document Revised: 04/23/2011 Document Reviewed: 11/10/2012  [employer]® Patient Information ©2014 [employer].

## 2013-05-20 NOTE — Progress Notes (Signed)
Subjective:    Patient ID: [name], female    DOB: [birth year], 80 y.o.   MRN: [id number]  HPI Comments: 80 yo female presents for 3 month F/U for HTN, Cholesterol, Pre-Dm, D. Deficient. Last OV started on thyroid medications. Daughter notes bowels more soft and patient is not sure when she is done defecating. The bowel changes have made hygiene more of concern. She is eating better and increasing active level. She has been taking Miralax but stopped last week with loose bowels. She is not checking BP. LAST LIPID T 193 TG 203 L 104 A1C 6.1    Hypertension   Current Outpatient Prescriptions on File Prior to Visit  Medication Sig Dispense Refill  . diclofenac (VOLTAREN) 50 MG EC tablet take 1 tablet by mouth twice a day  60 tablet  1  . feeding supplement (ENSURE COMPLETE) LIQD Take 237 mLs by mouth 2 (two) times daily between meals.  1 Bottle  3  . fluorometholone (EFLONE) 0.1 % ophthalmic suspension Place 1 drop into both eyes 3 (three) times daily.      Marland Kitchen. levobunolol (BETAGAN) 0.5 % ophthalmic solution Place 1 drop into both eyes 2 (two) times daily.      Marland Kitchen. levothyroxine (SYNTHROID) 50 MCG tablet Take 1 tablet (50 mcg total) by mouth daily. For thyroid  90 tablet  99  . losartan (COZAAR) 100 MG tablet Take 100 mg by mouth daily.      . mirtazapine (REMERON) 30 MG tablet 1/2 tab at bedtime for appetite  30 tablet  99   No current facility-administered medications on file prior to visit.   No Known Allergies Past Medical History  Diagnosis Date  . Arthritis   . Hypertension   . Diabetes mellitus without complication   . Hepatitis C   . Hyperlipidemia   . Thyroid disease   . Glaucoma   . Vitamin D deficiency   . DJD (degenerative joint disease)   . Allergy        Review of Systems  Gastrointestinal: Positive for diarrhea and constipation.  All other systems reviewed and are negative.  BP 124/70  Pulse 56  Temp(Src) 98.2 F (36.8 C) (Temporal)  Resp 16  Ht 5'  3" (1.6 m)  Wt 138 lb (62.596 kg)  BMI 24.45 kg/m2     Objective:   Physical Exam  Nursing note and vitals reviewed. Constitutional: She is oriented to person, place, and time. She appears well-developed and well-nourished. No distress.  HENT:  Head: Normocephalic and atraumatic.  Right Ear: External ear normal.  Left Ear: External ear normal.  Nose: Nose normal.  Mouth/Throat: Oropharynx is clear and moist.  Eyes: Conjunctivae and EOM are normal.  Neck: Normal range of motion. Neck supple. No JVD present. No thyromegaly present.  Cardiovascular: Normal rate, regular rhythm, normal heart sounds and intact distal pulses.   Pulmonary/Chest: Effort normal and breath sounds normal.  Abdominal: Soft. Bowel sounds are normal. She exhibits no distension and no mass. There is no tenderness. There is no rebound and no guarding.  Musculoskeletal: Normal range of motion. She exhibits no edema and no tenderness.  Walks slowly with cain but steady gait.  Lymphadenopathy:    She has no cervical adenopathy.  Neurological: She is alert and oriented to person, place, and time. No cranial nerve deficit.  Skin: Skin is warm and dry. No rash noted. No erythema. No pallor.  Psychiatric: She has a normal mood and affect. Her behavior is  normal. Judgment and thought content normal.          Assessment & Plan:  1.  3 month F/U for HTN, Cholesterol, Pre-Dm, D. Deficient. Needs healthy diet, cardio QD and obtain healthy weight. Check Labs, Check BP if >130/80 call office  2. Hypothyroidism- Recheck labs with new bowel changes vs chronic Constipation- Increase fiber/ water intake, decrease caffeine, increase activity level, can add Metamucil QD until BM soft

## 2013-05-21 LAB — LIPID PANEL
Cholesterol: 238 mg/dL — ABNORMAL HIGH (ref 0–200)
HDL: 46 mg/dL (ref >39)
LDL Cholesterol: 151 mg/dL — ABNORMAL HIGH (ref 0–99)
Total CHOL/HDL Ratio: 5.2 Ratio
Triglycerides: 207 mg/dL — ABNORMAL HIGH (ref <150)
VLDL: 41 mg/dL — ABNORMAL HIGH (ref 0–40)

## 2013-05-21 LAB — HEPATIC FUNCTION PANEL
ALBUMIN: 3.7 g/dL (ref 3.5–5.2)
ALK PHOS: 60 U/L (ref 39–117)
ALT: 30 U/L (ref 0–35)
AST: 32 U/L (ref 0–37)
Bilirubin, Direct: 0.1 mg/dL (ref 0.0–0.3)
Indirect Bilirubin: 0.4 mg/dL (ref 0.2–1.2)
TOTAL PROTEIN: 7.4 g/dL (ref 6.0–8.3)
Total Bilirubin: 0.5 mg/dL (ref 0.2–1.2)

## 2013-05-21 LAB — BASIC METABOLIC PANEL WITH GFR
BUN: 27 mg/dL — ABNORMAL HIGH (ref 6–23)
CHLORIDE: 107 meq/L (ref 96–112)
CO2: 26 meq/L (ref 19–32)
Calcium: 9.5 mg/dL (ref 8.4–10.5)
Creat: 0.99 mg/dL (ref 0.50–1.10)
GFR, Est African American: 58 mL/min — ABNORMAL LOW
GFR, Est Non African American: 51 mL/min — ABNORMAL LOW
GLUCOSE: 152 mg/dL — AB (ref 70–99)
POTASSIUM: 4.3 meq/L (ref 3.5–5.3)
SODIUM: 139 meq/L (ref 135–145)

## 2013-05-21 LAB — HEMOGLOBIN A1C
HEMOGLOBIN A1C: 6.4 % — AB (ref <5.7)
MEAN PLASMA GLUCOSE: 137 mg/dL — AB (ref <117)

## 2013-05-21 LAB — TSH: TSH: 2.755 u[IU]/mL (ref 0.350–4.500)

## 2013-06-18 ENCOUNTER — Other Ambulatory Visit: Payer: Self-pay

## 2013-06-18 DIAGNOSIS — G894 Chronic pain syndrome: Secondary | ICD-10-CM

## 2013-06-18 DIAGNOSIS — E039 Hypothyroidism, unspecified: Secondary | ICD-10-CM

## 2013-06-18 MED ORDER — MIRTAZAPINE 30 MG PO TABS: ORAL_TABLET | ORAL | Status: DC

## 2013-06-18 MED ORDER — LEVOTHYROXINE SODIUM 50 MCG PO TABS: 50.0000 ug | ORAL_TABLET | Freq: Every day | ORAL | Status: DC

## 2013-06-18 MED ORDER — LOSARTAN POTASSIUM 100 MG PO TABS: 100.0000 mg | ORAL_TABLET | Freq: Every day | ORAL | Status: DC

## 2013-06-18 MED ORDER — DICLOFENAC SODIUM 50 MG PO TBEC: DELAYED_RELEASE_TABLET | ORAL | Status: DC

## 2013-06-22 ENCOUNTER — Other Ambulatory Visit: Payer: Self-pay | Admitting: Internal Medicine

## 2013-06-22 MED ORDER — MIRTAZAPINE 30 MG PO TABS: ORAL_TABLET | ORAL | Status: DC

## 2013-08-20 ENCOUNTER — Encounter: Payer: Self-pay | Admitting: Internal Medicine

## 2013-08-20 ENCOUNTER — Ambulatory Visit (INDEPENDENT_AMBULATORY_CARE_PROVIDER_SITE_OTHER): Payer: Medicare Other | Admitting: Internal Medicine

## 2013-08-20 VITALS — BP 126/62 | HR 68 | Temp 97.5°F | Resp 16 | Ht 63.0 in | Wt 137.8 lb

## 2013-08-20 DIAGNOSIS — I10 Essential (primary) hypertension: Secondary | ICD-10-CM

## 2013-08-20 DIAGNOSIS — Z79899 Other long term (current) drug therapy: Secondary | ICD-10-CM | POA: Insufficient documentation

## 2013-08-20 DIAGNOSIS — E1129 Type 2 diabetes mellitus with other diabetic kidney complication: Secondary | ICD-10-CM

## 2013-08-20 DIAGNOSIS — E559 Vitamin D deficiency, unspecified: Secondary | ICD-10-CM

## 2013-08-20 DIAGNOSIS — E785 Hyperlipidemia, unspecified: Secondary | ICD-10-CM

## 2013-08-20 NOTE — Progress Notes (Signed)
Patient ID: [name], female   DOB: 08-Sep-1922, 80 y.o.   MRN: [id number]   This very nice 80 y.o.WBF presents for 3 month follow up with Hypertension, Hyperlipidemia, Pre-Diabetes and Vitamin D Deficiency.    HTN predates over 40 years. BP has been controlled at home. Today's BP: 126/62 mmHg. Patient denies any cardiac type chest pain, palpitations, dyspnea/orthopnea/PND, dizziness, claudication, or dependent edema.   Hyperlipidemia is not controlled with diet. Patient denies myalgias or other med SE's. Last Lipids were   Lab Results  Component Value Date   CHOL 238* 05/20/2013   HDL 46 05/20/2013   LDLCALC 782151* 05/20/2013   TRIG 207* 05/20/2013   CHOLHDL 5.2 05/20/2013  Patient is deferred treatment of lipids due to age.    Also, the patient has history of diet controlled T2_NIDDM  And Stage 3 CKD (GFR 58 ml/min) since Apr 2012 and last A1c was  6.4% in Jan 2015.  Patient denies any symptoms of reactive hypoglycemia, diabetic polys, paresthesias or visual blurring.   Further, Patient has history of Vitamin D Deficiency and last vitamin D was 2777 in Jan 2015.  Patient supplements vitamin D without any suspected side-effects.   Medication List       This list is accurate as of: 08/20/13  2:20 PM.  Always use your most recent med list.               diclofenac 50 MG EC tablet  Commonly known as:  VOLTAREN  take 1 tablet by mouth twice a day     feeding supplement (ENSURE COMPLETE) Liqd  Take 237 mLs by mouth 2 (two) times daily between meals.     fluorometholone 0.1 % ophthalmic suspension  Commonly known as:  FML     fluorometholone 0.1 % ophthalmic suspension  Commonly known as:  EFLONE  Place 1 drop into both eyes 3 (three) times daily.     levobunolol 0.5 % ophthalmic solution  Commonly known as:  BETAGAN  Place 1 drop into both eyes 2 (two) times daily.     levothyroxine 50 MCG tablet  Commonly known as:  SYNTHROID  Take 1 tablet (50 mcg total) by mouth daily. For  thyroid     losartan 100 MG tablet  Commonly known as:  COZAAR  Take 1 tablet (100 mg total) by mouth daily.     mirtazapine 30 MG tablet  Commonly known as:  REMERON  1/2 to 1 tab at bedtime for appetite as directed         No Known Allergies  PMHx:   Past Medical History  Diagnosis Date  . Arthritis   . Hypertension   . Diabetes mellitus without complication   . Hepatitis C   . Hyperlipidemia   . Thyroid disease   . Glaucoma   . Vitamin D deficiency   . DJD (degenerative joint disease)   . Allergy     FHx:    Reviewed / unchanged  SHx:    Reviewed / unchanged  Systems Review:  Constitutional: Denies fever, chills, wt changes, headaches, insomnia, fatigue, night sweats, change in appetite. Eyes: Denies redness, blurred vision, diplopia, discharge, itchy, watery eyes.  ENT: Denies discharge, congestion, post nasal drip, epistaxis, sore throat, earache, hearing loss, dental pain, tinnitus, vertigo, sinus pain, snoring.  CV: Denies chest pain, palpitations, irregular heartbeat, syncope, dyspnea, diaphoresis, orthopnea, PND, claudication or edema. Respiratory: denies cough, dyspnea, DOE, pleurisy, hoarseness, laryngitis, wheezing.  Gastrointestinal: Denies dysphagia, odynophagia, heartburn, reflux,  water brash, abdominal pain or cramps, nausea, vomiting, bloating, diarrhea, constipation, hematemesis, melena, hematochezia  or hemorrhoids. Genitourinary: Denies dysuria, frequency, urgency, nocturia, hesitancy, discharge, hematuria or flank pain. Musculoskeletal: Denies arthralgias, myalgias, stiffness, jt. swelling, pain, limping or strain/sprain.  Skin: Denies pruritus, rash, hives, warts, acne, eczema or change in skin lesion(s). Neuro: No weakness, tremor, incoordination, spasms, paresthesia or pain. Psychiatric: Denies confusion, memory loss or sensory loss. Endo: Denies change in weight, skin or hair change.  Heme/Lymph: No excessive bleeding, bruising or enlarged lymph  nodes.  Exam:  BP 126/62  Pulse 68  Temp(Src) 97.5 F (36.4 C) (Temporal)  Resp 16  Ht 5\' 3"  (1.6 m)  Wt 137 lb 12.8 oz (62.506 kg)  BMI 24.42 kg/m2  Appears well nourished and in no distress. Eyes: PERRLA, EOMs, conjunctiva no swelling or erythema. Sinuses: No frontal/maxillary tenderness ENT/Mouth: EAC's clear, TM's nl w/o erythema, bulging. Nares clear w/o erythema, swelling, exudates. Oropharynx clear without erythema or exudates. Oral hygiene is good. Tongue normal, non obstructing. Hearing intact.  Neck: Supple. Thyroid nl. Car 2+/2+ without bruits, nodes or JVD. Chest: Respirations nl with BS clear & equal w/o rales, rhonchi, wheezing or stridor.  Cor: Heart sounds normal w/ regular rate and rhythm without sig. murmurs, gallops, clicks, or rubs. Peripheral pulses normal and equal  without edema.  Abdomen: Soft & bowel sounds normal. Non-tender w/o guarding, rebound, hernias, masses, or organomegaly.  Lymphatics: Unremarkable.  Musculoskeletal: Full ROM all peripheral extremities, joint stability, 5/5 strength, and normal gait.  Skin: Warm, dry without exposed rashes, lesions or ecchymosis apparent.  Neuro: Cranial nerves intact, reflexes equal bilaterally. Sensory-motor testing grossly intact. Tendon reflexes grossly intact.  Pysch: Alert & oriented x 3. Insight and judgement nl & appropriate. No ideations.  Assessment and Plan:  1. Hypertension - Continue monitor blood pressure. Continue diet/meds same.  2. Hyperlipidemia - Continue diet.  3. T2_NIDDM w/Stage 3 CKD - continue recommend prudent low glycemic diet, weight control, regular exercise, diabetic monitoring and periodic eye exams.  4. Vitamin D Deficiency - Continue supplementation.  Recommended regular exercise, BP monitoring, weight control, and discussed med and SE's. Recommended labs to assess and monitor clinical status. Further disposition pending results of labs.

## 2013-08-21 LAB — BASIC METABOLIC PANEL WITH GFR
BUN: 28 mg/dL — ABNORMAL HIGH (ref 6–23)
CALCIUM: 9.6 mg/dL (ref 8.4–10.5)
CO2: 25 mEq/L (ref 19–32)
CREATININE: 1.01 mg/dL (ref 0.50–1.10)
Chloride: 106 mEq/L (ref 96–112)
GFR, EST NON AFRICAN AMERICAN: 49 mL/min — AB
GFR, Est African American: 57 mL/min — ABNORMAL LOW
Glucose, Bld: 134 mg/dL — ABNORMAL HIGH (ref 70–99)
Potassium: 5 mEq/L (ref 3.5–5.3)
SODIUM: 139 meq/L (ref 135–145)

## 2013-08-21 LAB — HEPATIC FUNCTION PANEL
ALBUMIN: 3.6 g/dL (ref 3.5–5.2)
ALT: 27 U/L (ref 0–35)
AST: 30 U/L (ref 0–37)
Alkaline Phosphatase: 62 U/L (ref 39–117)
BILIRUBIN DIRECT: 0.1 mg/dL (ref 0.0–0.3)
BILIRUBIN TOTAL: 0.5 mg/dL (ref 0.2–1.2)
Indirect Bilirubin: 0.4 mg/dL (ref 0.2–1.2)
Total Protein: 7.8 g/dL (ref 6.0–8.3)

## 2013-08-21 LAB — CBC WITH DIFFERENTIAL/PLATELET
BASOS ABS: 0 10*3/uL (ref 0.0–0.1)
Basophils Relative: 0 % (ref 0–1)
EOS PCT: 6 % — AB (ref 0–5)
Eosinophils Absolute: 0.5 10*3/uL (ref 0.0–0.7)
HCT: 31 % — ABNORMAL LOW (ref 36.0–46.0)
Hemoglobin: 10.8 g/dL — ABNORMAL LOW (ref 12.0–15.0)
Lymphocytes Relative: 44 % (ref 12–46)
Lymphs Abs: 3.3 10*3/uL (ref 0.7–4.0)
MCH: 32.8 pg (ref 26.0–34.0)
MCHC: 34.8 g/dL (ref 30.0–36.0)
MCV: 94.2 fL (ref 78.0–100.0)
Monocytes Absolute: 0.6 10*3/uL (ref 0.1–1.0)
Monocytes Relative: 8 % (ref 3–12)
Neutro Abs: 3.2 10*3/uL (ref 1.7–7.7)
Neutrophils Relative %: 42 % — ABNORMAL LOW (ref 43–77)
Platelets: 210 10*3/uL (ref 150–400)
RBC: 3.29 MIL/uL — AB (ref 3.87–5.11)
RDW: 14.1 % (ref 11.5–15.5)
WBC: 7.6 10*3/uL (ref 4.0–10.5)

## 2013-08-21 LAB — HEMOGLOBIN A1C
Hgb A1c MFr Bld: 6.4 % — ABNORMAL HIGH (ref <5.7)
MEAN PLASMA GLUCOSE: 137 mg/dL — AB (ref <117)

## 2013-08-21 LAB — INSULIN, FASTING: INSULIN FASTING, SERUM: 26 u[IU]/mL (ref 3–28)

## 2013-08-21 LAB — TSH: TSH: 3.875 u[IU]/mL (ref 0.350–4.500)

## 2013-08-21 LAB — VITAMIN D 25 HYDROXY (VIT D DEFICIENCY, FRACTURES): VIT D 25 HYDROXY: 65 ng/mL (ref 30–89)

## 2013-08-21 LAB — MAGNESIUM: MAGNESIUM: 1.9 mg/dL (ref 1.5–2.5)

## 2013-10-14 ENCOUNTER — Other Ambulatory Visit: Payer: Self-pay

## 2013-10-14 DIAGNOSIS — G894 Chronic pain syndrome: Secondary | ICD-10-CM

## 2013-10-14 MED ORDER — DICLOFENAC SODIUM 50 MG PO TBEC: DELAYED_RELEASE_TABLET | ORAL | Status: DC

## 2013-10-29 ENCOUNTER — Telehealth: Payer: Self-pay

## 2013-10-29 NOTE — Telephone Encounter (Signed)
Patient has had cough for about 7 days. This morning patient had no voice. Per Dr.McKeown, patient can take OTC Delsym. If symptoms do not improve, patient needs to schedule an office visit. Jackie aware.

## 2013-11-02 ENCOUNTER — Ambulatory Visit (INDEPENDENT_AMBULATORY_CARE_PROVIDER_SITE_OTHER): Payer: Medicare Other | Admitting: Physician Assistant

## 2013-11-02 ENCOUNTER — Encounter: Payer: Self-pay | Admitting: Physician Assistant

## 2013-11-02 VITALS — BP 140/78 | HR 60 | Temp 98.1°F | Resp 16 | Ht 63.0 in | Wt 137.0 lb

## 2013-11-02 DIAGNOSIS — R059 Cough, unspecified: Secondary | ICD-10-CM

## 2013-11-02 DIAGNOSIS — R5383 Other fatigue: Secondary | ICD-10-CM

## 2013-11-02 DIAGNOSIS — D649 Anemia, unspecified: Secondary | ICD-10-CM

## 2013-11-02 DIAGNOSIS — R5381 Other malaise: Secondary | ICD-10-CM

## 2013-11-02 DIAGNOSIS — R05 Cough: Secondary | ICD-10-CM

## 2013-11-02 DIAGNOSIS — E039 Hypothyroidism, unspecified: Secondary | ICD-10-CM

## 2013-11-02 LAB — CBC WITH DIFFERENTIAL/PLATELET
BASOS PCT: 0 % (ref 0–1)
Basophils Absolute: 0 10*3/uL (ref 0.0–0.1)
Eosinophils Absolute: 0.2 10*3/uL (ref 0.0–0.7)
Eosinophils Relative: 4 % (ref 0–5)
HEMATOCRIT: 33 % — AB (ref 36.0–46.0)
Hemoglobin: 11.3 g/dL — ABNORMAL LOW (ref 12.0–15.0)
LYMPHS PCT: 43 % (ref 12–46)
Lymphs Abs: 2.5 10*3/uL (ref 0.7–4.0)
MCH: 32.8 pg (ref 26.0–34.0)
MCHC: 34.2 g/dL (ref 30.0–36.0)
MCV: 95.7 fL (ref 78.0–100.0)
MONO ABS: 0.7 10*3/uL (ref 0.1–1.0)
Monocytes Relative: 12 % (ref 3–12)
NEUTROS ABS: 2.4 10*3/uL (ref 1.7–7.7)
NEUTROS PCT: 41 % — AB (ref 43–77)
Platelets: 209 10*3/uL (ref 150–400)
RBC: 3.45 MIL/uL — ABNORMAL LOW (ref 3.87–5.11)
RDW: 13.1 % (ref 11.5–15.5)
WBC: 5.9 10*3/uL (ref 4.0–10.5)

## 2013-11-02 LAB — COMPREHENSIVE METABOLIC PANEL
ALBUMIN: 3.4 g/dL — AB (ref 3.5–5.2)
ALK PHOS: 72 U/L (ref 39–117)
ALT: 35 U/L (ref 0–35)
AST: 39 U/L — AB (ref 0–37)
BUN: 24 mg/dL — AB (ref 6–23)
CALCIUM: 9.4 mg/dL (ref 8.4–10.5)
CHLORIDE: 102 meq/L (ref 96–112)
CO2: 28 mEq/L (ref 19–32)
Creat: 1.19 mg/dL — ABNORMAL HIGH (ref 0.50–1.10)
Glucose, Bld: 222 mg/dL — ABNORMAL HIGH (ref 70–99)
POTASSIUM: 5.1 meq/L (ref 3.5–5.3)
Sodium: 135 mEq/L (ref 135–145)
TOTAL PROTEIN: 7.7 g/dL (ref 6.0–8.3)
Total Bilirubin: 0.5 mg/dL (ref 0.2–1.2)

## 2013-11-02 LAB — TSH: TSH: 2.058 u[IU]/mL (ref 0.350–4.500)

## 2013-11-02 MED ORDER — AZITHROMYCIN 250 MG PO TABS: ORAL_TABLET | ORAL | Status: AC

## 2013-11-02 NOTE — Patient Instructions (Signed)
Please stop the Diclofenac FOR NOW, can restart later WITH FOOD Start on the omeprazole samples once daily before food  Can take Claritin  Can take ZPAK IF NOT BETTER

## 2013-11-02 NOTE — Progress Notes (Signed)
   Subjective:    Patient ID: [name], female    DOB: [birth year], 80 y.o.   MRN: [id number]  HPI 80 y.o. female with DM II, CKD stage III, hypercalcemia presents with her daughter presents with fatigue, cough for 1 week. She has been on OTC cough syrup that has helped. Denies CP, SOB, wheezing. Her daughter states she has been burping, having some trouble swallowing, denies vomiting has had soft stools but no diarrhea/constipation.    Review of Systems  Constitutional: Positive for fatigue. Negative for fever, chills, diaphoresis, activity change, appetite change and unexpected weight change.  HENT: Positive for postnasal drip. Negative for facial swelling, hearing loss, mouth sores, nosebleeds, rhinorrhea, sinus pressure, sneezing, sore throat and tinnitus.   Respiratory: Positive for cough. Negative for apnea, choking, chest tightness, shortness of breath, wheezing and stridor.   Cardiovascular: Negative.   Gastrointestinal: Positive for nausea. Negative for vomiting, abdominal pain, diarrhea, constipation, blood in stool, abdominal distention, anal bleeding and rectal pain.  Genitourinary: Negative.   Musculoskeletal: Negative.   Skin: Negative.   Neurological: Negative.        Objective:   Physical Exam  Constitutional: She appears well-developed.  HENT:  Head: Normocephalic and atraumatic.  Right Ear: External ear normal.  Left Ear: External ear normal.  Eyes: Conjunctivae are normal. Pupils are equal, round, and reactive to light.  Neck: Normal range of motion. Neck supple.  Cardiovascular: Regular rhythm.   Pulmonary/Chest: Effort normal and breath sounds normal.  Abdominal: Soft. There is no tenderness.  Musculoskeletal:  Walks unsteady/slow with walker  Neurological: She is alert. No cranial nerve deficit.  Skin: Skin is warm and dry. No rash noted.       Assessment & Plan:  Cough/fatigue: Possible nocturnal GERD- Please stop NSAIDS and get on prilosec  samples- check CBC to rule out anemia PND- start loratadine Sinusitis- zpak Work hard to stop throat clearing.  Plan a time when you will able to practice complete voice rest  Use sugar free candy to ease cough and throat irritation.   Go to the ER if any CP, SOB, nausea, dizziness, severe HA, changes vision/speech

## 2013-11-23 ENCOUNTER — Encounter: Payer: Self-pay | Admitting: Physician Assistant

## 2013-11-23 ENCOUNTER — Ambulatory Visit (HOSPITAL_COMMUNITY)
Admission: RE | Admit: 2013-11-23 | Discharge: 2013-11-23 | Disposition: A | Discharge status: Home / Self Care | Payer: Medicare Other | Source: Ambulatory Visit | Attending: Physician Assistant | Admitting: Physician Assistant

## 2013-11-23 ENCOUNTER — Ambulatory Visit (INDEPENDENT_AMBULATORY_CARE_PROVIDER_SITE_OTHER): Payer: Medicare Other | Admitting: Physician Assistant

## 2013-11-23 VITALS — BP 132/68 | HR 80 | Temp 98.1°F | Resp 16 | Ht 63.0 in | Wt 127.0 lb

## 2013-11-23 DIAGNOSIS — B1920 Unspecified viral hepatitis C without hepatic coma: Secondary | ICD-10-CM

## 2013-11-23 DIAGNOSIS — N183 Chronic kidney disease, stage 3 (moderate): Secondary | ICD-10-CM

## 2013-11-23 DIAGNOSIS — Z0001 Encounter for general adult medical examination with abnormal findings: Secondary | ICD-10-CM

## 2013-11-23 DIAGNOSIS — R059 Cough, unspecified: Secondary | ICD-10-CM

## 2013-11-23 DIAGNOSIS — E785 Hyperlipidemia, unspecified: Secondary | ICD-10-CM

## 2013-11-23 DIAGNOSIS — I10 Essential (primary) hypertension: Secondary | ICD-10-CM

## 2013-11-23 DIAGNOSIS — R05 Cough: Secondary | ICD-10-CM | POA: Diagnosis not present

## 2013-11-23 DIAGNOSIS — Z1331 Encounter for screening for depression: Secondary | ICD-10-CM

## 2013-11-23 DIAGNOSIS — E1122 Type 2 diabetes mellitus with diabetic chronic kidney disease: Secondary | ICD-10-CM

## 2013-11-23 DIAGNOSIS — D649 Anemia, unspecified: Secondary | ICD-10-CM

## 2013-11-23 DIAGNOSIS — Z23 Encounter for immunization: Secondary | ICD-10-CM

## 2013-11-23 DIAGNOSIS — R350 Frequency of micturition: Secondary | ICD-10-CM

## 2013-11-23 DIAGNOSIS — Z79899 Other long term (current) drug therapy: Secondary | ICD-10-CM

## 2013-11-23 DIAGNOSIS — E559 Vitamin D deficiency, unspecified: Secondary | ICD-10-CM

## 2013-11-23 DIAGNOSIS — R7303 Prediabetes: Secondary | ICD-10-CM

## 2013-11-23 DIAGNOSIS — Z9181 History of falling: Secondary | ICD-10-CM

## 2013-11-23 LAB — CBC WITH DIFFERENTIAL/PLATELET
BASOS PCT: 0 % (ref 0–1)
Basophils Absolute: 0 10*3/uL (ref 0.0–0.1)
EOS ABS: 0.2 10*3/uL (ref 0.0–0.7)
Eosinophils Relative: 3 % (ref 0–5)
HCT: 32.6 % — ABNORMAL LOW (ref 36.0–46.0)
Hemoglobin: 11.2 g/dL — ABNORMAL LOW (ref 12.0–15.0)
LYMPHS ABS: 3.3 10*3/uL (ref 0.7–4.0)
Lymphocytes Relative: 48 % — ABNORMAL HIGH (ref 12–46)
MCH: 33 pg (ref 26.0–34.0)
MCHC: 34.4 g/dL (ref 30.0–36.0)
MCV: 96.2 fL (ref 78.0–100.0)
Monocytes Absolute: 0.7 10*3/uL (ref 0.1–1.0)
Monocytes Relative: 10 % (ref 3–12)
Neutro Abs: 2.7 10*3/uL (ref 1.7–7.7)
Neutrophils Relative %: 39 % — ABNORMAL LOW (ref 43–77)
PLATELETS: 192 10*3/uL (ref 150–400)
RBC: 3.39 MIL/uL — AB (ref 3.87–5.11)
RDW: 14.2 % (ref 11.5–15.5)
WBC: 6.8 10*3/uL (ref 4.0–10.5)

## 2013-11-23 LAB — BASIC METABOLIC PANEL WITH GFR
BUN: 24 mg/dL — ABNORMAL HIGH (ref 6–23)
CALCIUM: 9.6 mg/dL (ref 8.4–10.5)
CO2: 26 mEq/L (ref 19–32)
CREATININE: 1.01 mg/dL (ref 0.50–1.10)
Chloride: 105 mEq/L (ref 96–112)
GFR, EST NON AFRICAN AMERICAN: 49 mL/min — AB
GFR, Est African American: 57 mL/min — ABNORMAL LOW
Glucose, Bld: 165 mg/dL — ABNORMAL HIGH (ref 70–99)
Potassium: 5.5 mEq/L — ABNORMAL HIGH (ref 3.5–5.3)
SODIUM: 140 meq/L (ref 135–145)

## 2013-11-23 LAB — TSH: TSH: 3.319 u[IU]/mL (ref 0.350–4.500)

## 2013-11-23 LAB — HEPATIC FUNCTION PANEL
ALBUMIN: 3.3 g/dL — AB (ref 3.5–5.2)
ALT: 41 U/L — ABNORMAL HIGH (ref 0–35)
AST: 56 U/L — ABNORMAL HIGH (ref 0–37)
Alkaline Phosphatase: 75 U/L (ref 39–117)
BILIRUBIN DIRECT: 0.1 mg/dL (ref 0.0–0.3)
BILIRUBIN TOTAL: 0.4 mg/dL (ref 0.2–1.2)
Indirect Bilirubin: 0.3 mg/dL (ref 0.2–1.2)
Total Protein: 7.5 g/dL (ref 6.0–8.3)

## 2013-11-23 LAB — LIPID PANEL
CHOL/HDL RATIO: 4 ratio
CHOLESTEROL: 198 mg/dL (ref 0–200)
HDL: 50 mg/dL (ref >39)
LDL Cholesterol: 107 mg/dL — ABNORMAL HIGH (ref 0–99)
Triglycerides: 207 mg/dL — ABNORMAL HIGH (ref <150)
VLDL: 41 mg/dL — AB (ref 0–40)

## 2013-11-23 MED ORDER — RANITIDINE HCL 300 MG PO TABS: 300.0000 mg | ORAL_TABLET | Freq: Two times a day (BID) | ORAL | Status: DC

## 2013-11-23 NOTE — Progress Notes (Signed)
MEDICARE ANNUAL WELLNESS VISIT AND FOLLOW UP  Assessment:   1. Need for prophylactic vaccination and inoculation against influenza - Flu vaccine HIGH DOSE PF  2. Essential hypertension - CBC with Differential - BASIC METABOLIC PANEL WITH GFR - Hepatic function panel - TSH  3. Hepatitis C virus infection without hepatic coma, unspecified chronicity Check LFT  4. CKD stage 3 due to type 2 diabetes mellitus Discussed general issues about diabetes pathophysiology and management., Educational material distributed., Suggested low cholesterol diet., Encouraged aerobic exercise., Discussed foot care., Reminded to get yearly retinal exam. - Hemoglobin A1c - HM DIABETES FOOT EXAM  5. Normocytic anemia Check /cbc  6. Hypercalcemia Check BMP  7. Dyslipidemia - Lipid panel  8. Vitamin D deficiency  9. Encounter for long-term (current) use of medications  10. Cough ? GERD vs lung, get CXR, add zantac 300 BID - DG Chest 2 View; Future  11. Encounter for general adult medical examination with abnormal findings  12. Screening for depression negative  13. At high risk for falls Declines PT, get bilateral knee injection for OA   Plan:   During the course of the visit the patient was educated and counseled about appropriate screening and preventive services including:    Pneumococcal vaccine   Influenza vaccine  Td vaccine  Screening electrocardiogram  Screening mammography  Bone densitometry screening  Colorectal cancer screening  Diabetes screening  Glaucoma screening  Nutrition counseling   Advanced directives: given info/requested  Screening recommendations, referrals:  Vaccinations: Please see documentation below and orders this visit.   Nutrition assessed and recommended  Colonoscopy declined Mammogram declined Pap smear not indicated Pelvic exam not indicated Recommended yearly ophthalmology/optometry visit for glaucoma screening and  checkup Recommended yearly dental visit for hygiene and checkup Advanced directives - requested  Conditions/risks identified: BMI: Discussed weight loss, diet, and increase physical activity.  Increase physical activity: AHA recommends 150 minutes of physical activity a week.  Medications reviewed DEXA- declined Urinary Incontinence is an issue: discussed non pharmacology and pharmacology options.  Fall risk: high- discussed PT, home fall assessment, medications.    Subjective:   [name] is a 80 y.o. female who presents for Medicare Annual Wellness Visit and 3 month follow up on hypertension, prediabetes, hyperlipidemia, vitamin D def.  Date of last medicare wellness visit is unknown.   Her blood pressure has been controlled at home, today their BP is BP: 132/68 mmHg She does not workout. She denies chest pain, shortness of breath, dizziness.  She is not on cholesterol medication and denies myalgias. Her cholesterol is not at goal. The cholesterol last visit was:   Lab Results  Component Value Date   CHOL 238* 05/20/2013   HDL 46 05/20/2013   LDLCALC 469* 05/20/2013   TRIG 207* 05/20/2013   CHOLHDL 5.2 05/20/2013   She has been working on diet and exercise for diabetes with CKD, and denies paresthesia of the feet, polydipsia, polyuria and visual disturbances. Last A1C in the office was:  Lab Results  Component Value Date   HGBA1C 6.4* 08/20/2013   Patient is on Vitamin D supplement. Lab Results  Component Value Date   VD25OH 31 08/20/2013     Patient lives with her daughter, denies any falls in the past year.  She complains of chronic pain in bilateral knees and gets injection from Dr. Heron Nay. She will go back for this.  She has dementia and her daughter provides the majority of the history.  Last visit she was  complaining cough at night and beltching/reflux symptoms, she was taken off the diclofenac and put on PPI. It is helping some but she is complaining of pain.     Names of Other Physician/Practitioners you currently use: 1. [facility]- here for primary care Dentures, no dentist Patient Care Team: Lucky CowboyWilliam McKeown, MD as PCP - General (Internal Medicine) Madilyn Hookhristopher L Groat, MD as Consulting Physician (Optometry) Griffith CitronJeffrey R Medoff, MD as Consulting Physician (Gastroenterology) Velna OchsPeter G Dalldorf, MD as Consulting Physician (Orthopedic Surgery) Aris LotSteven L Zacks, MD as Consulting Physician (Gastroenterology)  Medication Review Current Outpatient Prescriptions on File Prior to Visit  Medication Sig Dispense Refill  . diclofenac (VOLTAREN) 50 MG EC tablet take 1 tablet by mouth twice a day  180 tablet  3  . feeding supplement (ENSURE COMPLETE) LIQD Take 237 mLs by mouth 2 (two) times daily between meals.  1 Bottle  3  . fluorometholone (EFLONE) 0.1 % ophthalmic suspension Place 1 drop into both eyes 3 (three) times daily.      . fluorometholone (FML) 0.1 % ophthalmic suspension       . levobunolol (BETAGAN) 0.5 % ophthalmic solution Place 1 drop into both eyes 2 (two) times daily.      Marland Kitchen. levothyroxine (SYNTHROID) 50 MCG tablet Take 1 tablet (50 mcg total) by mouth daily. For thyroid  90 tablet  1  . losartan (COZAAR) 100 MG tablet Take 1 tablet (100 mg total) by mouth daily.  90 tablet  1  . mirtazapine (REMERON) 30 MG tablet 1/2 to 1 tab at bedtime for appetite as directed  90 tablet  99   No current facility-administered medications on file prior to visit.    Current Problems (verified) Patient Active Problem List   Diagnosis Date Noted  . Encounter for long-term (current) use of medications 08/20/2013  . Vitamin D deficiency 02/19/2013  . Seizure 06/13/2012  . Hepatitis C 06/10/2012  . Normocytic anemia 06/10/2012  . Hypercalcemia 06/10/2012  . CKD stage 3 due to type 2 diabetes mellitus 06/10/2012  . Dyslipidemia 06/10/2012  . Essential hypertension 06/10/2012    Screening Tests Health Maintenance   Topic Date Due  . Foot Exam  03/12/1934  . Ophthalmology Exam  03/12/1934  . Tetanus/tdap  03/13/1943  . Zostavax  03/12/1984  . Urine Microalbumin  02/19/2014  . Hemoglobin A1c  02/20/2014  . Influenza Vaccine  09/13/2014  . Colonoscopy  02/12/2017  . Pneumococcal Polysaccharide Vaccine Age 78 And Over  Completed     Immunization History  Administered Date(s) Administered  . Influenza Whole 10/29/2012  . Influenza, High Dose Seasonal PF 11/23/2013  . Pneumococcal Polysaccharide-23 07/15/2008    Preventative care: Last colonoscopy: declines Last mammogram: declines Last pap smear/pelvic exam: declines   DEXA:declines  Prior vaccinations: TD or Tdap: declines  Influenza: 2015 Pneumococcal: 2010 Prevnar 13: wants to wait Shingles/Zostavax: declines  History reviewed: allergies, current medications, past family history, past medical history, past social history, past surgical history and problem list  Risk Factors: Osteoporosis/FallRisk: postmenopausal estrogen deficiency and dietary calcium and/or vitamin D deficiency In the past year have you fallen or had a near fall?:No History of fracture in the past year: no  Tobacco History  Substance Use Topics  . Smoking status: Never Smoker   . Smokeless tobacco: Not on file  . Alcohol Use: No   She does not smoke.  Patient is not a former smoker. Are there smokers in your home (other than you)?  No  Alcohol Current alcohol use: none  Caffeine Current caffeine use: denies use  Exercise Current exercise: no regular exercise  Nutrition/Diet Current diet: in general, a "healthy" diet    Cardiac risk factors: advanced age (older than 6155 for men, 7265 for women), diabetes mellitus, dyslipidemia, hypertension, obesity (BMI >= 30 kg/m2) and sedentary lifestyle.  Depression Screen (Note: if answer to either of the following is "Yes", a more complete depression screening is indicated)   Q1: Over the past two weeks, have  you felt down, depressed or hopeless? No  Q2: Over the past two weeks, have you felt little interest or pleasure in doing things? No  Have you lost interest or pleasure in daily life? No  Do you often feel hopeless? No  Do you cry easily over simple problems? No  Activities of Daily Living In your present state of health, do you have any difficulty performing the following activities?:  Driving? Yes Managing money?  Yes Feeding yourself? Yes Getting from bed to chair? Yes Climbing a flight of stairs? Yes Preparing food and eating?: Yes Bathing or showering? Yes Getting dressed: Yes Getting to the toilet? Yes Using the toilet:Yes Moving around from place to place: Yes   Are you sexually active?  No  Do you have more than one partner?  No  Vision Difficulties: Yes  Hearing Difficulties: Yes Do you often ask people to speak up or repeat themselves? Yes Do you experience ringing or noises in your ears? No Do you have difficulty understanding soft or whispered voices? Yes  Cognition  Do you feel that you have a problem with memory?Yes  Do you often misplace items? Yes  Do you feel safe at home?  Yes  Advanced directives Does patient have a Health Care Power of Attorney? Yes Does patient have a Living Will? Yes   Objective:   Blood pressure 132/68, pulse 80, temperature 98.1 F (36.7 C), resp. rate 16, height 5\' 3"  (1.6 m), weight 127 lb (57.607 kg). Body mass index is 22.5 kg/(m^2).  General appearance: alert, no distress, WD/WN,  female Cognitive Testing  Alert? Yes  Normal Appearance?Yes  Oriented to person? Yes  Place? No   Time? No  Recall of three objects?  No  Can perform simple calculations? No  Displays appropriate judgment?No  Can read the correct time from a watch face?No  HEENT: normocephalic, sclerae anicteric, TMs pearly, nares patent, no discharge or erythema, pharynx normal Oral cavity: MMM, no lesions Neck: supple, no lymphadenopathy, no  thyromegaly, no masses Heart: RRR, normal S1, S2 Lungs: decreased BS LLL.  no wheezes, rhonchi, or rales Abdomen: +bs, soft, non tender, non distended, no masses, no hepatomegaly, no splenomegaly Musculoskeletal: nontender, no swelling, no obvious deformity Extremities: no edema, no cyanosis, no clubbing Pulses: 2+ symmetric, upper and lower extremities, normal cap refill Neurological: alert, oriented x 2, CN2-12 intact, strength decreased upper extremities and lower extremities, sensation decreased throughout bilateral feet, unsteady gait, walks with walker Psychiatric: normal affect, behavior normal, pleasant  Breast: defer Gyn: defer Rectal: defer  Medicare Attestation I have personally reviewed: The patient's medical and social history Their use of alcohol, tobacco or illicit drugs Their current medications and supplements The patient's functional ability including ADLs,fall risks, home safety risks, cognitive, and hearing and visual impairment Diet and physical activities Evidence for depression or mood disorders  The patient's weight, height, BMI, and visual acuity have been recorded in the chart.  I have made referrals, counseling, and provided education to the  patient based on review of the above and I have provided the patient with a written personalized care plan for preventive services.     Quentin Mulling, PA-C   11/23/2013

## 2013-11-23 NOTE — Patient Instructions (Signed)

## 2013-11-24 LAB — URINALYSIS, ROUTINE W REFLEX MICROSCOPIC
Bilirubin Urine: NEGATIVE
Glucose, UA: NEGATIVE mg/dL
Hgb urine dipstick: NEGATIVE
Ketones, ur: NEGATIVE mg/dL
Nitrite: NEGATIVE
PH: 5.5 (ref 5.0–8.0)
Protein, ur: NEGATIVE mg/dL
SPECIFIC GRAVITY, URINE: 1.017 (ref 1.005–1.030)
Urobilinogen, UA: 1 mg/dL (ref 0.0–1.0)

## 2013-11-24 LAB — URINE CULTURE
Colony Count: NO GROWTH
Organism ID, Bacteria: NO GROWTH

## 2013-11-24 LAB — URINALYSIS, MICROSCOPIC ONLY
Bacteria, UA: NONE SEEN
CASTS: NONE SEEN
Crystals: NONE SEEN
Squamous Epithelial / LPF: NONE SEEN

## 2013-11-24 LAB — HEMOGLOBIN A1C
Hgb A1c MFr Bld: 6.6 % — ABNORMAL HIGH (ref <5.7)
Mean Plasma Glucose: 143 mg/dL — ABNORMAL HIGH (ref <117)

## 2013-12-10 ENCOUNTER — Other Ambulatory Visit: Payer: Self-pay | Admitting: Physician Assistant

## 2013-12-10 MED ORDER — TRAMADOL HCL 50 MG PO TABS: 50.0000 mg | ORAL_TABLET | Freq: Two times a day (BID) | ORAL | Status: DC | PRN

## 2013-12-23 ENCOUNTER — Other Ambulatory Visit: Payer: Self-pay | Admitting: Physician Assistant

## 2014-01-19 ENCOUNTER — Encounter: Payer: Self-pay | Admitting: Physician Assistant

## 2014-01-19 ENCOUNTER — Ambulatory Visit (INDEPENDENT_AMBULATORY_CARE_PROVIDER_SITE_OTHER): Payer: Medicare Other | Admitting: Physician Assistant

## 2014-01-19 VITALS — BP 132/82 | HR 60 | Temp 97.9°F | Resp 16 | Wt 141.0 lb

## 2014-01-19 DIAGNOSIS — B372 Candidiasis of skin and nail: Secondary | ICD-10-CM

## 2014-01-19 MED ORDER — NYSTATIN 100000 UNIT/ML MT SUSP
OROMUCOSAL | Status: DC
Start: 2014-01-19 — End: 2014-06-14

## 2014-01-19 NOTE — Progress Notes (Signed)
   Subjective:    Patient ID: [name], female    DOB: 1923/01/11, 80 y.o.   MRN: [id number]  HPI 80 y.o. female with history of DM II with CKD, chol, HTN, dementia, chronic pain, GERD presents with rash bilateral axilla. Daughter provides the history, she states the patient has been rubbing under bilateral axilla.   Given zantac 300mg  BID that has helped some with burping. Had recent left knee injection with ortho, walks with walker. Had dehydration and hyperkalemia due to dehydration. AST/ALT elevated.   Review of Systems  Constitutional: Negative.   HENT: Negative.   Respiratory: Negative.   Cardiovascular: Negative.   Gastrointestinal: Negative.   Genitourinary: Negative.   Musculoskeletal: Negative.   Skin: Positive for rash.  Neurological: Negative.        Objective:   Physical Exam  Constitutional: She appears well-developed.  HENT:  Head: Normocephalic and atraumatic.  Right Ear: External ear normal.  Left Ear: External ear normal.  Eyes: Conjunctivae are normal. Pupils are equal, round, and reactive to light.  Neck: Normal range of motion. Neck supple.  Cardiovascular: Regular rhythm.   Pulmonary/Chest: Effort normal and breath sounds normal.  Abdominal: Soft. There is no tenderness.  Musculoskeletal:  Walks unsteady/slow with walker  Neurological: She is alert. No cranial nerve deficit.  Skin: Skin is warm and dry. Rash (bilateral arms with erythematous patches. ) noted.      Assessment & Plan:  1. Candidiasis of skin and nails - nystatin (MYCOSTATIN) 100000 UNIT/ML suspension; 5ml swish and spit 4 times day for 2 days after the symptoms stop.  Dispense: 240 mL; Refill: 1 Declines labs, follow up in 1 month.   Future Appointments Date Time Provider Department Center  02/23/2014 2:00 PM Lucky CowboyWilliam McKeown, MD [id number] None

## 2014-01-19 NOTE — Patient Instructions (Signed)
Yeast Infection of the Skin Some yeast on the skin is normal, but sometimes it causes an infection. If you have a yeast infection, it shows up as white or light brown patches on brown skin. You can see it better in the summer on tan skin. It causes light-colored holes in your suntan. It can happen on any area of the body. This cannot be passed from person to person. HOME CARE  Scrub your skin daily with a dandruff shampoo. Your rash may take a couple weeks to get well.  Do not scratch or itch the rash. GET HELP RIGHT AWAY IF:   You get another infection from scratching. The skin may get warm, red, and may ooze fluid.  The infection does not seem to be getting better. MAKE SURE YOU:  Understand these instructions.  Will watch your condition.  Will get help right away if you are not doing well or get worse. Document Released: 01/12/2008 Document Revised: 04/23/2011 Document Reviewed: 01/12/2008 Exit[employer] Patient Information 2015 [employer]. This information is not intended to replace advice given to you by your health care provider. Make sure you discuss any questions you have with your health care provider.  

## 2014-02-16 DIAGNOSIS — H4011X3 Primary open-angle glaucoma, severe stage: Secondary | ICD-10-CM | POA: Diagnosis not present

## 2014-02-16 DIAGNOSIS — Z961 Presence of intraocular lens: Secondary | ICD-10-CM | POA: Diagnosis not present

## 2014-02-16 DIAGNOSIS — H1851 Endothelial corneal dystrophy: Secondary | ICD-10-CM | POA: Diagnosis not present

## 2014-02-23 ENCOUNTER — Encounter: Payer: Self-pay | Admitting: Internal Medicine

## 2014-02-23 ENCOUNTER — Ambulatory Visit (INDEPENDENT_AMBULATORY_CARE_PROVIDER_SITE_OTHER): Payer: Medicare Other | Admitting: Internal Medicine

## 2014-02-23 VITALS — BP 136/72 | HR 60 | Temp 98.1°F | Resp 16 | Ht 61.75 in | Wt 139.6 lb

## 2014-02-23 DIAGNOSIS — E039 Hypothyroidism, unspecified: Secondary | ICD-10-CM | POA: Diagnosis not present

## 2014-02-23 DIAGNOSIS — Z79899 Other long term (current) drug therapy: Secondary | ICD-10-CM | POA: Diagnosis not present

## 2014-02-23 DIAGNOSIS — Z1331 Encounter for screening for depression: Secondary | ICD-10-CM

## 2014-02-23 DIAGNOSIS — E782 Mixed hyperlipidemia: Secondary | ICD-10-CM | POA: Diagnosis not present

## 2014-02-23 DIAGNOSIS — E1121 Type 2 diabetes mellitus with diabetic nephropathy: Secondary | ICD-10-CM | POA: Diagnosis not present

## 2014-02-23 DIAGNOSIS — I10 Essential (primary) hypertension: Secondary | ICD-10-CM | POA: Diagnosis not present

## 2014-02-23 DIAGNOSIS — Z1212 Encounter for screening for malignant neoplasm of rectum: Secondary | ICD-10-CM

## 2014-02-23 DIAGNOSIS — D649 Anemia, unspecified: Secondary | ICD-10-CM

## 2014-02-23 DIAGNOSIS — E559 Vitamin D deficiency, unspecified: Secondary | ICD-10-CM | POA: Diagnosis not present

## 2014-02-23 DIAGNOSIS — Z9181 History of falling: Secondary | ICD-10-CM

## 2014-02-23 DIAGNOSIS — R569 Unspecified convulsions: Secondary | ICD-10-CM

## 2014-02-23 NOTE — Patient Instructions (Signed)

## 2014-02-23 NOTE — Progress Notes (Signed)
Patient ID: [name], female   DOB: 06-Nov-1922, 81 y.o.   MRN: [id number]  Annual Comprehensive Examination  This very nice 81 y.o.WBF presents for complete physical.  Patient has been followed for HTN, T2_NIDDM w/CKD3, Hyperlipidemia, and Vitamin D Deficiency.    HTN predates since 681975 ( ~ age 81 yo). Patient's BP has been controlled at home and patient denies any cardiac symptoms as chest pain, palpitations, shortness of breath, dizziness or ankle swelling. Patient has Stage 3 CKD (GFR 50 ml/min). Today's BP: 136/72 mmHg    Patient's hyperlipidemia is controlled with diet and medications. Patient denies myalgias or other medication SE's. Last lipids were Total Chol 198; HDL  50; LDL (calc) 107*; [id number] on 11/23/2013.   Patient has T2_NIDDM prediabetes predating since 2001 and patient denies reactive hypoglycemic symptoms, visual blurring, diabetic polys, or paresthesias. Last A1c was 6.6% on 11/23/2013.   Finally, patient has history of Vitamin D Deficiency and last Vitamin D was  65 on 08/20/2013.  Medication Sig  . feeding supplement (ENSURE COMPLETE) LIQD Take 237 mLs by mouth 2 (two) times daily between meals.  . fluorometholone (EFLONE) 0.1 % ophthalmic suspension Place 1 drop into both eyes 3 (three) times daily.  . fluorometholone (FML) 0.1 % ophthalmic suspension   . levobunolol (BETAGAN) 0.5 % ophthalmic solution Place 1 drop into both eyes 2 (two) times daily.  Marland Kitchen. levothyroxine (SYNTHROID) 50 MCG tablet Take 1 tablet (50 mcg total) by mouth daily. For thyroid  . losartan (COZAAR) 100 MG tablet take 1 tablet by mouth once daily  . mirtazapine (REMERON) 30 MG tablet 1/2 to 1 tab at bedtime for appetite as directed  . nystatin (MYCOSTATIN) 100000 UNIT/ML suspension 5ml swish and spit 4 times day for 2 days after the symptoms stop.  . ranitidine (ZANTAC) 300 MG tablet Take 1 tablet (300 mg total) by mouth 2 (two) times daily.  . traMADol (ULTRAM) 50 MG tablet Take 1 tablet   every 12 hours as needed for moderate pain or severe pain.   No Known Allergies   Past Medical History  Diagnosis Date  . Arthritis   . Hypertension   . Diabetes mellitus without complication   . Hepatitis C   . Hyperlipidemia   . Thyroid disease   . Glaucoma   . Vitamin D deficiency   . DJD (degenerative joint disease)   . Allergy    Health Maintenance  Topic Date Due  . TETANUS/TDAP  03/13/1943  . ZOSTAVAX  03/12/1984  . DEXA SCAN  03/12/1989  . URINE MICROALBUMIN  02/19/2014  . OPHTHALMOLOGY EXAM  01/16/202016  . HEMOGLOBIN A1C  05/25/2014  . INFLUENZA VACCINE  09/13/2014  . FOOT EXAM  11/24/2014  . COLONOSCOPY  02/12/2017  . PNEUMOCOCCAL POLYSACCHARIDE VACCINE AGE 18 AND OVER  Completed   Immunization History  Administered Date(s) Administered  . Influenza Whole 10/29/2012  . Influenza, High Dose Seasonal PF 11/23/2013  . Pneumococcal Polysaccharide-23 07/15/2008   Past Surgical History  Procedure Laterality Date  . Cataract extraction Right    Family History  Problem Relation Age of Onset  . Hypertension Mother   . Heart attack Mother   . Hypertension Father   . Heart attack Father    History  Substance Use Topics  . Smoking status: Never Smoker   . Smokeless tobacco: Not on file  . Alcohol Use: No    ROS Constitutional: Denies fever, chills, weight loss/gain, headaches, insomnia, fatigue, night sweats, and change in appetite.  Eyes: Denies redness,  diplopia, discharge, itchy, watery eyes. 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  ENT: Denies discharge, congestion, post nasal drip, epistaxis, sore throat, earache, hearing loss, dental pain, Tinnitus, Vertigo, Sinus pain, snoring.  Cardio: Denies chest pain, palpitations, irregular heartbeat, syncope, dyspnea, diaphoresis, orthopnea, PND, claudication, edema Respiratory: denies cough, dyspnea, DOE, pleurisy, hoarseness, laryngitis, wheezing.  Gastrointestinal: Denies dysphagia, heartburn, reflux,  water brash, pain, cramps, nausea, vomiting, bloating, diarrhea, constipation, hematemesis, melena, hematochezia, jaundice, hemorrhoids Genitourinary: Denies dysuria, frequency, urgency, nocturia, hesitancy, discharge, hematuria, flank pain Breast: Breast lumps, nipple discharge, bleeding.  Musculoskeletal: Denies arthralgia, myalgia, stiffness, Jt. Swelling, pain, limp, and strain/sprain. Denies falls. Skin: Denies puritis, rash, hives, warts, acne, eczema, changing in skin lesion Neuro: No weakness, tremor, incoordination, spasms, paresthesia, pain Psychiatric: Denies confusion, memory loss, sensory loss. Denies Depression. Endocrine: Denies change in weight, skin, hair change, nocturia, and paresthesia, diabetic polys, visual blurring, hyper / hypo glycemic episodes.  Heme/Lymph: No excessive bleeding, bruising, enlarged lymph nodes.  Physical Exam  BP 136/72   Pulse 60  Temp 98.1 F   Resp 16  Ht 5' 1.75"   Wt 139 lb 9.6 oz   BMI 25.76  General Appearance: Well nourished and in no apparent distress. Eyes: Bilat de=nse corneal scarring precluding fundoscopic exam an pupils irregular and assymetric. VA limited to hand motion.  Sinuses: No frontal/maxillary tenderness ENT/Mouth: EACs patent / TMs  nl. Nares clear without erythema, swelling, mucoid exudates. Oral hygiene is good. No erythema, swelling, or exudate. Tongue normal, non-obstructing. Tonsils not swollen or erythematous. Hearing normal. Edentulous. Neck: Supple, thyroid normal. No bruits, nodes or JVD. Respiratory: Respiratory effort normal.  BS equal and clear bilateral without rales, rhonci, wheezing or stridor. Cardio: Heart sounds are normal with regular rate and rhythm and no murmurs, rubs or gallops. Peripheral pulses are +/- equivocally present and without edema. No aortic or femoral bruits. Chest: symmetric with normal excursions and percussion. Breasts: Symmetric, without lumps, nipple discharge, retractions, or  fibrocystic changes.  Abdomen: Flat, soft, with bowl sounds. Nontender, no guarding, rebound, hernias, masses, or organomegaly.  Lymphatics: Non tender without lymphadenopathy.  Musculoskeletal: Full ROM all peripheral extremities, joint stability, generalized decrease in muscle power , tone & bulk, and gait is guided and stabilized with a walker.  Skin: Warm and dry without rashes, lesions, cyanosis, clubbing or  ecchymosis.  Neuro: Cranial nerves intact, reflexes equal bilaterally. Normal muscle tone, no cerebellar symptoms. Sensation intact.  Pysch: Awake and oriented X 3, normal affect, Insight and Judgment appropriate.   Assessment and Plan  1. Hypertension  2. Hyperlipidemia 3. T2_NIDDM w/CKD3 4. Vitamin D Deficiency   Continue prudent diet as discussed, weight control, BP monitoring, regular exercise, and medications. Discussed med's effects and SE's. Screening labs and tests as requested with regular follow-up as recommended.

## 2014-02-24 LAB — LIPID PANEL
Cholesterol: 197 mg/dL (ref 0–200)
HDL: 50 mg/dL (ref >39)
LDL CALC: 114 mg/dL — AB (ref 0–99)
TRIGLYCERIDES: 164 mg/dL — AB (ref <150)
Total CHOL/HDL Ratio: 3.9 Ratio
VLDL: 33 mg/dL (ref 0–40)

## 2014-02-24 LAB — URINALYSIS, MICROSCOPIC ONLY
Bacteria, UA: NONE SEEN
CASTS: NONE SEEN
Crystals: NONE SEEN
SQUAMOUS EPITHELIAL / LPF: NONE SEEN

## 2014-02-24 LAB — BASIC METABOLIC PANEL WITH GFR
BUN: 14 mg/dL (ref 6–23)
CO2: 28 mEq/L (ref 19–32)
Calcium: 9.2 mg/dL (ref 8.4–10.5)
Chloride: 103 mEq/L (ref 96–112)
Creat: 0.75 mg/dL (ref 0.50–1.10)
GFR, Est African American: 82 mL/min
GFR, Est Non African American: 71 mL/min
GLUCOSE: 283 mg/dL — AB (ref 70–99)
POTASSIUM: 4.5 meq/L (ref 3.5–5.3)
SODIUM: 137 meq/L (ref 135–145)

## 2014-02-24 LAB — HEPATIC FUNCTION PANEL
ALT: 61 U/L — AB (ref 0–35)
AST: 82 U/L — ABNORMAL HIGH (ref 0–37)
Albumin: 3.2 g/dL — ABNORMAL LOW (ref 3.5–5.2)
Alkaline Phosphatase: 73 U/L (ref 39–117)
Bilirubin, Direct: 0.1 mg/dL (ref 0.0–0.3)
Indirect Bilirubin: 0.5 mg/dL (ref 0.2–1.2)
TOTAL PROTEIN: 7.3 g/dL (ref 6.0–8.3)
Total Bilirubin: 0.6 mg/dL (ref 0.2–1.2)

## 2014-02-24 LAB — VITAMIN D 25 HYDROXY (VIT D DEFICIENCY, FRACTURES): Vit D, 25-Hydroxy: 56 ng/mL (ref 30–100)

## 2014-02-24 LAB — CBC WITH DIFFERENTIAL/PLATELET
BASOS ABS: 0 10*3/uL (ref 0.0–0.1)
BASOS PCT: 0 % (ref 0–1)
EOS PCT: 3 % (ref 0–5)
Eosinophils Absolute: 0.2 10*3/uL (ref 0.0–0.7)
HCT: 36.6 % (ref 36.0–46.0)
HEMOGLOBIN: 12.2 g/dL (ref 12.0–15.0)
LYMPHS ABS: 2.7 10*3/uL (ref 0.7–4.0)
LYMPHS PCT: 49 % — AB (ref 12–46)
MCH: 33.4 pg (ref 26.0–34.0)
MCHC: 33.3 g/dL (ref 30.0–36.0)
MCV: 100.3 fL — ABNORMAL HIGH (ref 78.0–100.0)
MPV: 10.1 fL (ref 8.6–12.4)
Monocytes Absolute: 0.5 10*3/uL (ref 0.1–1.0)
Monocytes Relative: 9 % (ref 3–12)
NEUTROS ABS: 2.2 10*3/uL (ref 1.7–7.7)
NEUTROS PCT: 39 % — AB (ref 43–77)
PLATELETS: 167 10*3/uL (ref 150–400)
RBC: 3.65 MIL/uL — ABNORMAL LOW (ref 3.87–5.11)
RDW: 13.8 % (ref 11.5–15.5)
WBC: 5.6 10*3/uL (ref 4.0–10.5)

## 2014-02-24 LAB — MICROALBUMIN / CREATININE URINE RATIO
CREATININE, URINE: 62.4 mg/dL
MICROALB UR: 1.7 mg/dL (ref <2.0)
MICROALB/CREAT RATIO: 27.2 mg/g (ref 0.0–30.0)

## 2014-02-24 LAB — MAGNESIUM: Magnesium: 1.4 mg/dL — ABNORMAL LOW (ref 1.5–2.5)

## 2014-02-24 LAB — TSH: TSH: 3.225 u[IU]/mL (ref 0.350–4.500)

## 2014-02-24 LAB — INSULIN, FASTING: INSULIN FASTING, SERUM: 48.2 u[IU]/mL — AB (ref 2.0–19.6)

## 2014-02-24 LAB — HEMOGLOBIN A1C
Hgb A1c MFr Bld: 7.2 % — ABNORMAL HIGH (ref <5.7)
MEAN PLASMA GLUCOSE: 160 mg/dL — AB (ref <117)

## 2014-02-25 ENCOUNTER — Other Ambulatory Visit: Payer: Self-pay | Admitting: Internal Medicine

## 2014-02-25 MED ORDER — METFORMIN HCL ER 500 MG PO TB24: ORAL_TABLET | ORAL | Status: DC

## 2014-03-29 ENCOUNTER — Ambulatory Visit: Payer: Self-pay | Admitting: Internal Medicine

## 2014-03-30 ENCOUNTER — Other Ambulatory Visit: Payer: Self-pay | Admitting: Physician Assistant

## 2014-03-30 MED ORDER — MIRTAZAPINE 30 MG PO TABS: ORAL_TABLET | ORAL | Status: DC

## 2014-05-11 ENCOUNTER — Other Ambulatory Visit: Payer: Self-pay | Admitting: Internal Medicine

## 2014-05-17 ENCOUNTER — Encounter: Payer: Self-pay | Admitting: Internal Medicine

## 2014-05-17 ENCOUNTER — Ambulatory Visit (INDEPENDENT_AMBULATORY_CARE_PROVIDER_SITE_OTHER): Payer: Medicare Other | Admitting: Internal Medicine

## 2014-05-17 VITALS — BP 138/80 | HR 54 | Temp 98.4°F | Resp 16 | Ht 61.75 in | Wt 140.0 lb

## 2014-05-17 DIAGNOSIS — K6289 Other specified diseases of anus and rectum: Secondary | ICD-10-CM | POA: Diagnosis not present

## 2014-05-17 MED ORDER — HYDROCORTISONE 1 % EX CREA: TOPICAL_CREAM | CUTANEOUS | Status: AC

## 2014-05-17 NOTE — Progress Notes (Signed)
   Subjective:    Patient ID: [name], female    DOB: May 05, 1922, 81 y.o.   MRN: [id number]  HPI  Patient is a 81 y.o. Who presents with her daughter for evaulation of a sore on her buttocks.  She thinks that it has been there several weeks.  It is located at the top of her gluteal cleft.  Her daughter has been using nystatin on it with no relief.  She feels that it is getting worse and it is now warm and red.  She has been trying to keep her in water.  Her daughter reports that she has been tired and has not been wanting to get up.  She has stopped giving her metformin because she feels that it makes her sleepy.  Her daughter would like to try her off of the diabetes medication as she feels that this isn't helping.  Review of Systems  Constitutional: Negative for fever, chills and fatigue.  Respiratory: Negative for chest tightness and shortness of breath.   Cardiovascular: Negative for chest pain and leg swelling.  Gastrointestinal: Negative for nausea, vomiting, abdominal pain, diarrhea, constipation, blood in stool and rectal pain.  Genitourinary: Negative for dysuria, urgency, frequency, hematuria and difficulty urinating.  Musculoskeletal: Negative for myalgias, back pain and neck stiffness.  Skin: Positive for rash.       Objective:   Physical Exam  Constitutional: She is oriented to person, place, and time. She appears well-developed and well-nourished. No distress.  HENT:  Head: Normocephalic and atraumatic.  Mouth/Throat: Oropharynx is clear and moist. No oropharyngeal exudate.  Eyes: Conjunctivae and EOM are normal. Pupils are equal, round, and reactive to light. No scleral icterus.  Neck: Normal range of motion. Neck supple. No JVD present. No thyromegaly present.  Cardiovascular: Normal rate, regular rhythm, normal heart sounds and intact distal pulses.  Exam reveals no gallop and no friction rub.   No murmur heard. Pulmonary/Chest: Effort normal and breath sounds  normal. No respiratory distress. She has no wheezes. She has no rales. She exhibits no tenderness.  Abdominal: Soft. Bowel sounds are normal. She exhibits no distension and no mass. There is no tenderness. There is no rebound and no guarding.  Musculoskeletal: Normal range of motion.  Lymphadenopathy:    She has no cervical adenopathy.  Neurological: She is alert and oriented to person, place, and time.  Skin: Skin is warm and dry. She is not diaphoretic.     Nursing note and vitals reviewed.         Assessment & Plan:    1. Perirectal skin irritation -Given history think that this may be early pressure ulcer as daughter reports that patient spends most of the day sitting.  She is walking minimally.  No evidence of skin infection at this time.  Will use sparing amounts of hydrocortisone cream prn for some mild itching. Can use nystatin if it gets itchy and red.  Will use pillows and have encouraged movement.  Patient can use A&D ointment as needed.  - hydrocortisone cream 1 %; Apply to affected area 2 times daily  Dispense: 30 g; Refill: 1

## 2014-05-17 NOTE — Patient Instructions (Signed)
Pressure Ulcer A pressure ulcer is a sore that has formed from the breakdown of skin and exposure of deeper layers of tissue. It develops in areas of the body where there is unrelieved pressure. Pressure ulcers are usually found over a bony area, such as the shoulder blades, spine, lower back, hips, knees, ankles, and heels. Pressure ulcers vary in severity. Your health care provider may determine the severity (stage) of your pressure ulcer. The stages include:  Stage I--The skin is red, and when the skin is pressed, it stays red.  Stage II--The top layer of skin is gone, and there is a shallow, pink ulcer.  Stage III--The ulcer becomes deeper, and it is more difficult to see the whole wound. Also, there may be yellow or brown parts, as well as pink and red parts.  Stage IV--The ulcer may be deep and red, pink, brown, white, or yellow. Bone or muscle may be seen.  Unstageable pressure ulcer--The ulcer is covered almost completely with black, brown, or yellow tissue. It is not known how deep the ulcer is or what stage it is until this covering comes off.  Suspected deep tissue injury--A person's skin can be injured from pressure or pulling on the skin when his or her position is changed. The skin appears purple or maroon. There may not be an opening in the skin, but there could be a blood-filled blister. This deep tissue injury is often difficult to see in people with darker skin tones. The site may open and become deeper in time. However, early interventions will help the area heal and may prevent the area from opening. CAUSES  Pressure ulcers are caused by pressure against the skin that limits the flow of blood to the skin and nearby tissues. There are many risk factors that can lead to pressure sores. RISK FACTORS  Decreased ability to move.  Decreased ability to feel pain or discomfort.  Excessive skin moisture from urine, stool, sweat, or secretions.  Poor  nutrition.  Dehydration.  Tobacco, drug, or alcohol abuse.  Having someone pull on bedsheets that are under you, such as when health care workers are changing your position in a hospital bed.  Obesity.  Increased adult age.  Hospitalization in a critical care unit for longer than 4 days with use of medical devices.  Prolonged use of medical devices.  Critical illness.  Anemia.  Traumatic brain injury.  Spinal cord injury.  Stroke.  Diabetes.  Poor blood glucose control.  Low blood pressure (hypotension).  Low oxygen levels.  Medicines that reduce blood flow.  Infection. DIAGNOSIS  Your health care provider will diagnose your pressure ulcer based on its appearance. The health care provider may determine the stage of your pressure ulcer as well. Tests may be done to check for infection, to assess your circulation, or to check for other diseases, such as diabetes. TREATMENT  Treatment of your pressure ulcer begins with determining what stage the ulcer is in. Your treatment team may include your health care provider, a wound care specialist, a nutritionist, a physical therapist, and a surgeon. Possible treatments may include:   Moving or repositioning every 1-2 hours.  Using beds or mattresses to shift your body weight and pressure points frequently.  Improving your diet.  Cleaning and bandaging (dressing) the open wound.  Giving antibiotic medicines.  Removing damaged tissue.  Surgery and sometimes skin grafts. HOME CARE INSTRUCTIONS  If you were hospitalized, follow the care plan that was started in the hospital.    Avoid staying in the same position for more than 2 hours. Use padding, devices, or mattresses to cushion your pressure points as directed by your health care provider.  Eat a well-balanced diet. Take nutritional supplements and vitamins as directed by your health care provider.  Keep all follow-up appointments.  Only take over-the-counter or  prescription medicines for pain, fever, or discomfort as directed by your health care provider. SEEK MEDICAL CARE IF:   Your pressure ulcer is not improving.  You do not know how to care for your pressure ulcer.  You notice other areas of redness on your skin.  You have a fever. SEEK IMMEDIATE MEDICAL CARE IF:   You have increasing redness, swelling, or pain in your pressure ulcer.  You notice pus coming from your pressure ulcer.  You notice a bad smell coming from the wound or dressing.  Your pressure ulcer opens up again. Document Released: 01/29/2005 Document Revised: 02/03/2013 Document Reviewed: 10/06/2012 [employer] Patient Information 2015 [employer]. This information is not intended to replace advice given to you by your health care provider. Make sure you discuss any questions you have with your health care provider.  

## 2014-05-18 ENCOUNTER — Other Ambulatory Visit: Payer: Self-pay | Admitting: *Deleted

## 2014-05-18 MED ORDER — GLUCOSE BLOOD VI STRP: ORAL_STRIP | Status: DC

## 2014-05-18 MED ORDER — FREESTYLE LANCETS MISC: Status: DC

## 2014-06-14 ENCOUNTER — Ambulatory Visit: Payer: Medicare Other | Admitting: Physician Assistant

## 2014-06-14 ENCOUNTER — Encounter: Payer: Self-pay | Admitting: Physician Assistant

## 2014-06-14 VITALS — BP 140/80 | HR 60 | Temp 97.7°F | Resp 16 | Ht 63.0 in | Wt 137.0 lb

## 2014-06-14 DIAGNOSIS — E782 Mixed hyperlipidemia: Secondary | ICD-10-CM | POA: Diagnosis not present

## 2014-06-14 DIAGNOSIS — I10 Essential (primary) hypertension: Secondary | ICD-10-CM | POA: Diagnosis not present

## 2014-06-14 DIAGNOSIS — B372 Candidiasis of skin and nail: Secondary | ICD-10-CM

## 2014-06-14 DIAGNOSIS — M25562 Pain in left knee: Secondary | ICD-10-CM

## 2014-06-14 DIAGNOSIS — R350 Frequency of micturition: Secondary | ICD-10-CM | POA: Diagnosis not present

## 2014-06-14 DIAGNOSIS — B1920 Unspecified viral hepatitis C without hepatic coma: Secondary | ICD-10-CM

## 2014-06-14 DIAGNOSIS — Z1331 Encounter for screening for depression: Secondary | ICD-10-CM

## 2014-06-14 DIAGNOSIS — M25561 Pain in right knee: Secondary | ICD-10-CM | POA: Insufficient documentation

## 2014-06-14 DIAGNOSIS — B370 Candidal stomatitis: Secondary | ICD-10-CM

## 2014-06-14 DIAGNOSIS — E1121 Type 2 diabetes mellitus with diabetic nephropathy: Secondary | ICD-10-CM | POA: Diagnosis not present

## 2014-06-14 DIAGNOSIS — D649 Anemia, unspecified: Secondary | ICD-10-CM

## 2014-06-14 DIAGNOSIS — E559 Vitamin D deficiency, unspecified: Secondary | ICD-10-CM

## 2014-06-14 DIAGNOSIS — E039 Hypothyroidism, unspecified: Secondary | ICD-10-CM | POA: Diagnosis not present

## 2014-06-14 DIAGNOSIS — Z9181 History of falling: Secondary | ICD-10-CM

## 2014-06-14 DIAGNOSIS — R569 Unspecified convulsions: Secondary | ICD-10-CM

## 2014-06-14 DIAGNOSIS — E1129 Type 2 diabetes mellitus with other diabetic kidney complication: Secondary | ICD-10-CM | POA: Diagnosis not present

## 2014-06-14 DIAGNOSIS — Z79899 Other long term (current) drug therapy: Secondary | ICD-10-CM

## 2014-06-14 MED ORDER — NYSTATIN 100000 UNIT/ML MT SUSP: OROMUCOSAL | Status: DC

## 2014-06-14 MED ORDER — TRAMADOL HCL 50 MG PO TABS: 50.0000 mg | ORAL_TABLET | Freq: Two times a day (BID) | ORAL | Status: DC | PRN

## 2014-06-14 NOTE — Progress Notes (Signed)
MEDICARE ANNUAL WELLNESS VISIT AND FOLLOW UP  Assessment:   1. Essential hypertension - continue medications, DASH diet, exercise and monitor at home. Call if greater than 130/80.  - BASIC METABOLIC PANEL WITH GFR - Hepatic function panel  2. T2_NIDDM /CKD3 Discussed general issues about diabetes pathophysiology and management., Educational material distributed., Suggested low cholesterol diet., Encouraged aerobic exercise., Discussed foot care., Reminded to get yearly retinal exam. - Hemoglobin A1c - HM DIABETES FOOT EXAM  3. Hypothyroidism, unspecified hypothyroidism type Hypothyroidism-check TSH level, continue medications the same, reminded to take on an empty stomach 30-107mins before food.  - TSH  4. Mixed hyperlipidemia -continue medications, check lipids, decrease fatty foods, increase activity.  - Lipid panel  5. Hypercalcemia Patient with fatigue, hypercalcemia, CKD check for hyperparathyroidism - Parathyroid Hormone, Intact w/Ca  6. Normocytic anemia - CBC with Differential/Platelet  7. Seizure remission  8. Vitamin D deficiency - Vit D  25 hydroxy (rtn osteoporosis monitoring)  9. Encounter for long-term (current) use of medications - Magnesium  10. Hepatitis C virus infection without hepatic coma, unspecified chronicity remission  11. Depression screen negative  12. Urinary frequency - Urinalysis, Routine w reflex microscopic - Urine culture  13. At high risk for falls Declines PT at this time  14. Thrush - nystatin (MYCOSTATIN) 100000 UNIT/ML suspension; 5ml swish and spit 4 times day for 2 days after the symptoms stop.  Dispense: 240 mL; Refill: 1  15. Knee pain, bilateral - traMADol (ULTRAM) 50 MG tablet; Take 1 tablet (50 mg total) by mouth every 12 (twelve) hours as needed for moderate pain or severe pain.  Dispense: 60 tablet; Refill: 0   Over 30 minutes of exam, counseling, chart review, and critical decision making was performed  Plan:    During the course of the visit the patient was educated and counseled about appropriate screening and preventive services including:    Pneumococcal vaccine   Influenza vaccine  Td vaccine  Screening electrocardiogram  Screening mammography  Bone densitometry screening  Colorectal cancer screening  Diabetes screening  Glaucoma screening  Nutrition counseling   Advanced directives: given info/requested  Conditions/risks identified: Diabetes is not at goal, ACE/ARB therapy: Yes. Urinary Incontinence is an issue: discussed non pharmacology and pharmacology options.  Fall risk: high- discussed PT, home fall assessment, medications.    Subjective:   [name] is a 81 y.o. female who presents for Medicare Annual Wellness Visit and 3 month follow up on hypertension, diabetes, hyperlipidemia, vitamin D def.  Date of last medicare wellness visit is unknown.   Her blood pressure has been controlled at home, today their BP is BP: 140/80 mmHg She does not workout. She denies chest pain, shortness of breath, dizziness.  She is not on cholesterol medication and denies myalgias. Her cholesterol is not at goal. The cholesterol last visit was:   Lab Results  Component Value Date   CHOL 197 02/23/2014   HDL 50 02/23/2014   LDLCALC 114* 02/23/2014   TRIG 164* 02/23/2014   CHOLHDL 3.9 02/23/2014   She has been working on diet and exercise for diabetes with CKD, she is on ARB, she is off the metformin and is diet controlled, sugars have been monitored very closely by her daughter, they have been running 110's in the AM and 200-280 after meals, and denies paresthesia of the feet, polydipsia, polyuria and visual disturbances. Last A1C in the office was:  Lab Results  Component Value Date   HGBA1C 7.2* 02/23/2014  Patient is on Vitamin D supplement. Lab Results  Component Value Date   VD25OH 76 02/23/2014     Patient lives with her daughter, denies any falls in the past  year.  She complains of chronic pain in bilateral knees and follows with Dr. Heron Nay, takes tramadol PRN, she takes rarely, had 60 filled in Dec and still has some left. Walks with walker.  She has been peeing more often.  She has dementia and her daughter provides the majority of the history.  She states that if she sits for a long time she will fall asleep, would like B12 checked, she has been giving her B12. She has a history of GERD.  She is on thyroid medication. Her medication was not changed last visit.   Lab Results  Component Value Date   TSH 3.225 02/23/2014  .   Medication Review Current Outpatient Prescriptions on File Prior to Visit  Medication Sig Dispense Refill  . fluorometholone (EFLONE) 0.1 % ophthalmic suspension Place 1 drop into both eyes 3 (three) times daily.    Marland Kitchen glucose blood (FREESTYLE TEST STRIPS) test strip Use as instructed 100 each 12  . hydrocortisone cream 1 % Apply to affected area 2 times daily 30 g 1  . Lancets (FREESTYLE) lancets Use as instructed 100 each 12  . levobunolol (BETAGAN) 0.5 % ophthalmic solution Place 1 drop into both eyes 2 (two) times daily.    Marland Kitchen levothyroxine (SYNTHROID) 50 MCG tablet Take 1 tablet (50 mcg total) by mouth daily. For thyroid 90 tablet 1  . losartan (COZAAR) 100 MG tablet take 1 tablet by mouth once daily 90 tablet 4  . metFORMIN (GLUCOPHAGE XR) 500 MG 24 hr tablet Take 1 tablet 2 or 3 x daily with meal for blood sugar & Diabetes 90 tablet 99  . mirtazapine (REMERON) 30 MG tablet 1/2- 1  tab at bedtime for appetite 30 tablet 3  . nystatin (MYCOSTATIN) 100000 UNIT/ML suspension 5ml swish and spit 4 times day for 2 days after the symptoms stop. 240 mL 1  . ranitidine (ZANTAC) 300 MG tablet   0  . traMADol (ULTRAM) 50 MG tablet Take 1 tablet (50 mg total) by mouth every 12 (twelve) hours as needed for moderate pain or severe pain. 60 tablet 0   No current facility-administered medications on file prior to visit.     Current Problems (verified) Patient Active Problem List   Diagnosis Date Noted  . Hypothyroidism 02/23/2014  . Encounter for long-term (current) use of medications 08/20/2013  . Vitamin D deficiency 02/19/2013  . Seizure 06/13/2012  . Hepatitis C 06/10/2012  . Normocytic anemia 06/10/2012  . Hypercalcemia 06/10/2012  . T2_NIDDM Marikay Alar 06/10/2012  . Mixed hyperlipidemia 06/10/2012  . Essential hypertension 06/10/2012    Screening Tests Immunization History  Administered Date(s) Administered  . Influenza Whole 10/29/2012  . Influenza, High Dose Seasonal PF 11/23/2013  . Pneumococcal Polysaccharide-23 07/15/2008    Preventative care:  Last colonoscopy: declines Last mammogram: declines Last pap smear/pelvic exam: declines   DEXA:declines  Prior vaccinations: TD or Tdap: declines  Influenza: 2015 Pneumococcal: 2010 Prevnar 13: wants to wait, out of in the office Shingles/Zostavax: declines.   Names of Other Physician/Practitioners you currently use: 1. [facility]- here for primary care Patient Care Team: Lucky Cowboy, MD as PCP - General (Internal Medicine) Sallye Lat, MD as Consulting Physician (Optometry) Sharrell Ku, MD as Consulting Physician (Gastroenterology) Marcene Corning, MD as Consulting Physician (Orthopedic Surgery)  Brooke DareSteven Zacks, MD as Consulting Physician (Gastroenterology)  Past Surgical History  Procedure Laterality Date  . Cataract extraction Right    Family History  Problem Relation Age of Onset  . Hypertension Mother   . Heart attack Mother   . Hypertension Father   . Heart attack Father    History  Substance Use Topics  . Smoking status: Never Smoker   . Smokeless tobacco: Not on file  . Alcohol Use: No    MEDICARE WELLNESS OBJECTIVES: Tobacco use: She does not smoke.  Patient is not a former smoker. Alcohol Current alcohol use: none Caffeine Current caffeine use: denies  use Osteoporosis: postmenopausal estrogen deficiency and dietary calcium and/or vitamin D deficiency, History of fracture in the past year: no Diet: in general, a "healthy" diet   Physical activity: ADLs, sendentary work and walking in the house Depression/mood screen:   Depression screen [facility] 06/14/2014  Decreased Interest 0  Down, Depressed, Hopeless 0  PHQ - 2 Score 0   Hearing: normal Visual acuity: impaired,  does perform annual eye exam  ADLs:  In your present state of health, do you have any difficulty performing the following activities: 06/14/2014 02/23/2014  Hearing? Y -  Vision? Y Y  Difficulty concentrating or making decisions? Y -  Walking or climbing stairs? Y -  Dressing or bathing? Y -  Doing errands, shopping? Y -  Quarry managerreparing Food and eating ? Y -  Using the Toilet? Y -  In the past six months, have you accidently leaked urine? Y -  Do you have problems with loss of bowel control? N -  Managing your Medications? Y -  Managing your Finances? Y -  Housekeeping or managing your Housekeeping? Y -    Fall risk: Moderate Risk Cognitive Testing  Alert? Yes  Normal Appearance?Yes  Oriented to person? No  Place? No   Time? No  Recall of three objects?  No  Can perform simple calculations? No  Displays appropriate judgment? No  Can read the correct time from a watch face?No No flowsheet data found. EOL planning: Does patient have an advance directive?: Yes Type of Advance Directive: Healthcare Power of Attorney, Living will Does patient want to make changes to advanced directive?: No - Patient declined Copy of advanced directive(s) in chart?: No - copy requested     Objective:   Blood pressure 140/80, pulse 60, temperature 97.7 F (36.5 C), resp. rate 16, height 5\' 3"  (1.6 m), weight 137 lb (62.143 kg). Body mass index is 24.27 kg/(m^2).  General appearance: alert, no distress, WD/WN,  female HEENT: normocephalic, sclerae anicteric, TMs pearly, nares patent, no  discharge or erythema, pharynx normal Oral cavity: MMM, no lesions Neck: supple, no lymphadenopathy, no thyromegaly, no masses Heart: RRR, normal S1, S2 Lungs: decreased BS LLL.  no wheezes, rhonchi, or rales Abdomen: +bs, soft, non tender, non distended, no masses, no hepatomegaly, no splenomegaly Musculoskeletal: nontender, no swelling, no obvious deformity Extremities: no edema, no cyanosis, no clubbing Pulses: 2+ symmetric, upper and lower extremities, normal cap refill Neurological: alert, oriented x 2, CN2-12 intact, strength decreased upper extremities and lower extremities, sensation decreased throughout bilateral feet, unsteady gait, walks with walker Psychiatric: normal affect, behavior normal, pleasant  Breast: defer Gyn: defer Rectal: defer   Medicare Attestation I have personally reviewed: The patient's medical and social history Their use of alcohol, tobacco or illicit drugs Their current medications and supplements The patient's functional ability including ADLs,fall risks, home safety risks, cognitive, and  hearing and visual impairment Diet and physical activities Evidence for depression or mood disorders  The patient's weight, height, BMI, and visual acuity have been recorded in the chart.  I have made referrals, counseling, and provided education to the patient based on review of the above and I have provided the patient with a written personalized care plan for preventive services.     Quentin Mulling, PA-C   06/14/2014

## 2014-06-14 NOTE — Patient Instructions (Addendum)
Your A1C is a measure of your sugar over the past 3 months and is not affected by what you have eaten over the past few days. Diabetes increases your chances of stroke and heart attack over 300 % and is the leading cause of blindness and kidney failure in the [place]. Please make sure you decrease bad carbs like white bread, white rice, potatoes, corn, soft drinks, pasta, cereals, refined sugars, sweet tea, dried fruits, and fruit juice. Good carbs are okay to eat in moderation like sweet potatoes, brown rice, whole grain pasta/bread, most fruit (except dried fruit) and you can eat as many veggies as you want.   Greater than 6.5 is considered diabetic. Between 6.4 and 5.7 is prediabetic If your A1C is less than 5.7 you are NOT diabetic.  Targets for Glucose Readings: Time of Check Target for patients WITHOUT Diabetes Target for DIABETICS  Before Meals Less than 100  less than 150  Two hours after meals Less than 200  Less than 250      Bad carbs also include fruit juice, alcohol, and sweet tea. These are empty calories that do not signal to your brain that you are full.   Please remember the good carbs are still carbs which convert into sugar. So please measure them out no more than 1/2-1 cup of rice, oatmeal, pasta, and beans  Veggies are however free foods! Pile them on.   Not all fruit is created equal. Please see the list below, the fruit at the bottom is higher in sugars than the fruit at the top. Please avoid all dried fruits.     Thrush, Adult  Ginette Pitmanhrush is an infection that can happen on the mouth, throat, tongue, or other areas. It causes white patches to form on the mouth and tongue. HOME CARE  Only take medicine as told by your doctor. You may be given medicine to swallow or to apply right on the area.  Eat plain yogurt that contains live cultures (check the label).  Rinse your mouth many times a day with a warm saltwater rinse. To make the rinse, mix 1 teaspoon (6 g) of  salt in 8 ounces (0.2 L) of warm water. To reduce pain:  Drink cold liquids such as water or iced tea.  Eat frozen ice pops or frozen juices.  Eat foods that are easy to swallow, such as gelatin or ice cream.  Drink from a straw if the patches are painful. If you are breastfeeding:  Clean your nipples with an antifungal medicine.  Dry your nipples after breastfeeding.  Use an ointment called lanolin to help relieve nipple soreness. If you wear dentures:  Take out your dentures before going to bed.  Brush them thoroughly.  Soak them in a denture cleaner. GET HELP IF:   Your problems are getting worse.  Your problems are not improving within 7 days of starting treatment.  Your infection is spreading. This may show as white patches on the skin outside of your mouth.  You are nursing and have redness and pain in the nipples. MAKE SURE YOU:  Understand these instructions.  Will watch your condition.  Will get help right away if you are not doing well or get worse. Document Released: 04/25/2009 Document Revised: 11/19/2012 Document Reviewed: 09/01/2012 [facility] Patient Information 2015 [place], [place]. This information is not intended to replace advice given to you by your health care provider. Make sure you discuss any questions you have with your health care provider.

## 2014-06-15 ENCOUNTER — Telehealth: Payer: Self-pay

## 2014-06-15 LAB — BASIC METABOLIC PANEL WITH GFR
BUN: 19 mg/dL (ref 6–23)
CALCIUM: 9.7 mg/dL (ref 8.4–10.5)
CO2: 29 meq/L (ref 19–32)
CREATININE: 0.92 mg/dL (ref 0.50–1.10)
Chloride: 104 mEq/L (ref 96–112)
GFR, Est African American: 63 mL/min
GFR, Est Non African American: 55 mL/min — ABNORMAL LOW
Glucose, Bld: 161 mg/dL — ABNORMAL HIGH (ref 70–99)
Potassium: 4.5 mEq/L (ref 3.5–5.3)
Sodium: 139 mEq/L (ref 135–145)

## 2014-06-15 LAB — HEPATIC FUNCTION PANEL
ALK PHOS: 57 U/L (ref 39–117)
ALT: 41 U/L — AB (ref 0–35)
AST: 60 U/L — AB (ref 0–37)
Albumin: 3.3 g/dL — ABNORMAL LOW (ref 3.5–5.2)
Bilirubin, Direct: 0.2 mg/dL (ref 0.0–0.3)
Indirect Bilirubin: 0.4 mg/dL (ref 0.2–1.2)
TOTAL PROTEIN: 7.4 g/dL (ref 6.0–8.3)
Total Bilirubin: 0.6 mg/dL (ref 0.2–1.2)

## 2014-06-15 LAB — HEMOGLOBIN A1C
Hgb A1c MFr Bld: 7.6 % — ABNORMAL HIGH (ref <5.7)
MEAN PLASMA GLUCOSE: 171 mg/dL — AB (ref <117)

## 2014-06-15 LAB — CBC WITH DIFFERENTIAL/PLATELET
BASOS ABS: 0 10*3/uL (ref 0.0–0.1)
BASOS PCT: 0 % (ref 0–1)
EOS ABS: 0.1 10*3/uL (ref 0.0–0.7)
Eosinophils Relative: 2 % (ref 0–5)
HEMATOCRIT: 36.2 % (ref 36.0–46.0)
Hemoglobin: 12.3 g/dL (ref 12.0–15.0)
Lymphocytes Relative: 56 % — ABNORMAL HIGH (ref 12–46)
Lymphs Abs: 3.3 10*3/uL (ref 0.7–4.0)
MCH: 33.5 pg (ref 26.0–34.0)
MCHC: 34 g/dL (ref 30.0–36.0)
MCV: 98.6 fL (ref 78.0–100.0)
MONOS PCT: 7 % (ref 3–12)
MPV: 9.8 fL (ref 8.6–12.4)
Monocytes Absolute: 0.4 10*3/uL (ref 0.1–1.0)
NEUTROS ABS: 2.1 10*3/uL (ref 1.7–7.7)
Neutrophils Relative %: 35 % — ABNORMAL LOW (ref 43–77)
PLATELETS: 170 10*3/uL (ref 150–400)
RBC: 3.67 MIL/uL — ABNORMAL LOW (ref 3.87–5.11)
RDW: 13.8 % (ref 11.5–15.5)
WBC: 5.9 10*3/uL (ref 4.0–10.5)

## 2014-06-15 LAB — URINALYSIS, ROUTINE W REFLEX MICROSCOPIC
BILIRUBIN URINE: NEGATIVE
Glucose, UA: NEGATIVE mg/dL
Hgb urine dipstick: NEGATIVE
KETONES UR: NEGATIVE mg/dL
Nitrite: NEGATIVE
Protein, ur: NEGATIVE mg/dL
Specific Gravity, Urine: 1.015 (ref 1.005–1.030)
Urobilinogen, UA: 1 mg/dL (ref 0.0–1.0)
pH: 5.5 (ref 5.0–8.0)

## 2014-06-15 LAB — MAGNESIUM: Magnesium: 1.5 mg/dL (ref 1.5–2.5)

## 2014-06-15 LAB — URINALYSIS, MICROSCOPIC ONLY
Bacteria, UA: NONE SEEN
CASTS: NONE SEEN
Crystals: NONE SEEN
Squamous Epithelial / LPF: NONE SEEN

## 2014-06-15 LAB — URINE CULTURE

## 2014-06-15 LAB — LIPID PANEL
CHOLESTEROL: 186 mg/dL (ref 0–200)
HDL: 60 mg/dL (ref >=46)
LDL CALC: 95 mg/dL (ref 0–99)
TRIGLYCERIDES: 155 mg/dL — AB (ref <150)
Total CHOL/HDL Ratio: 3.1 Ratio
VLDL: 31 mg/dL (ref 0–40)

## 2014-06-15 LAB — TSH: TSH: 1.995 u[IU]/mL (ref 0.350–4.500)

## 2014-06-15 LAB — VITAMIN D 25 HYDROXY (VIT D DEFICIENCY, FRACTURES): Vit D, 25-Hydroxy: 62 ng/mL (ref 30–100)

## 2014-06-15 NOTE — Telephone Encounter (Signed)
Received paper note from front office staff, Adela LankJacqueline (patients daughter) called to advise that patients pulse was 48. Per Marchelle FolksAmanda returned call to BrookdaleJacqueline and advised that at the "blinking" heart on Pulse OX indicates normal, that while she is sleeping pulse may go to 45 but while awake she would like for it stay above 50, patient verbalized understanding

## 2014-06-15 NOTE — Telephone Encounter (Signed)
Received paper note from front office staff, patients daughter Adela LankJacqueline called and advised that they purchased a pulse oximeter and wanted to know when her pulse got low when should they call and advise us. Returned call to patient and advised her Quentin Mullingmanda Collier, [place]PA to call office if pulse goes below 50 , also gave Adela LankJacqueline patients lab results and instructions for 06-14-14 visit.

## 2014-08-05 ENCOUNTER — Other Ambulatory Visit: Payer: Self-pay | Admitting: *Deleted

## 2014-08-05 DIAGNOSIS — E039 Hypothyroidism, unspecified: Secondary | ICD-10-CM

## 2014-08-05 MED ORDER — LEVOTHYROXINE SODIUM 50 MCG PO TABS: 50.0000 ug | ORAL_TABLET | Freq: Every day | ORAL | Status: DC

## 2014-08-25 ENCOUNTER — Ambulatory Visit: Payer: Self-pay | Admitting: Internal Medicine

## 2014-09-08 ENCOUNTER — Ambulatory Visit (INDEPENDENT_AMBULATORY_CARE_PROVIDER_SITE_OTHER): Payer: Medicare Other | Admitting: Physician Assistant

## 2014-09-08 ENCOUNTER — Encounter: Payer: Self-pay | Admitting: Physician Assistant

## 2014-09-08 VITALS — BP 122/74 | HR 52 | Temp 97.3°F | Resp 16 | Ht 63.0 in | Wt 140.8 lb

## 2014-09-08 DIAGNOSIS — I959 Hypotension, unspecified: Secondary | ICD-10-CM | POA: Diagnosis not present

## 2014-09-08 DIAGNOSIS — R296 Repeated falls: Secondary | ICD-10-CM

## 2014-09-08 DIAGNOSIS — Z961 Presence of intraocular lens: Secondary | ICD-10-CM | POA: Diagnosis not present

## 2014-09-08 DIAGNOSIS — R001 Bradycardia, unspecified: Secondary | ICD-10-CM

## 2014-09-08 DIAGNOSIS — Z9181 History of falling: Secondary | ICD-10-CM

## 2014-09-08 DIAGNOSIS — H1851 Endothelial corneal dystrophy: Secondary | ICD-10-CM | POA: Diagnosis not present

## 2014-09-08 DIAGNOSIS — Z0000 Encounter for general adult medical examination without abnormal findings: Secondary | ICD-10-CM | POA: Insufficient documentation

## 2014-09-08 DIAGNOSIS — H4011X3 Primary open-angle glaucoma, severe stage: Secondary | ICD-10-CM | POA: Diagnosis not present

## 2014-09-08 NOTE — Progress Notes (Signed)
   Subjective:    Patient ID: [name], female    DOB: 15-Feb-1922, 81 y.o.   MRN: [id number]  HPI 81 y.o. [name] with history of HTN, DM II with CKD, hypothyroid, chol, presents with her daughter for low blood pressure and low heart rate. She sleep a lot per the daughters but denies CP, SOB. She was on opthalmic BB that was discontinued today from her eye doctor. Her blood pressure was 110/64 with an RN that came in the home.   Lab Results  Component Value Date   TSH 1.995 06/14/2014     Current Outpatient Prescriptions on File Prior to Visit  Medication Sig Dispense Refill  . glucose blood (FREESTYLE TEST STRIPS) test strip Use as instructed 100 each 12  . hydrocortisone cream 1 % Apply to affected area 2 times daily 30 g 1  . Lancets (FREESTYLE) lancets Use as instructed 100 each 12  . levothyroxine (SYNTHROID) 50 MCG tablet Take 1 tablet (50 mcg total) by mouth daily. For thyroid 90 tablet 1  . losartan (COZAAR) 100 MG tablet take 1 tablet by mouth once daily 90 tablet 4  . ranitidine (ZANTAC) 300 MG tablet   0  . traMADol (ULTRAM) 50 MG tablet Take 1 tablet (50 mg total) by mouth every 12 (twelve) hours as needed for moderate pain or severe pain. 60 tablet 0   No current facility-administered medications on file prior to visit.   Past Medical History  Diagnosis Date  . Arthritis   . Hypertension   . Diabetes mellitus without complication   . Hepatitis C   . Hyperlipidemia   . Thyroid disease   . Glaucoma   . Vitamin D deficiency   . DJD (degenerative joint disease)   . Allergy     Review of Systems  Constitutional: Negative.   HENT: Negative.   Respiratory: Negative.   Cardiovascular: Negative.   Gastrointestinal: Negative.   Genitourinary: Negative.   Musculoskeletal: Negative.   Skin: Negative for rash.  Neurological: Negative.       Objective:   Physical Exam  Constitutional: She appears well-developed.  HENT:  Head: Normocephalic and atraumatic.   Right Ear: External ear normal.  Left Ear: External ear normal.  Eyes: Conjunctivae are normal. Pupils are equal, round, and reactive to light.  Neck: Normal range of motion. Neck supple.  Cardiovascular: Regular rhythm.   Pulmonary/Chest: Effort normal and breath sounds normal.  Abdominal: Soft. There is no tenderness.  Musculoskeletal:  Walks unsteady/slow with walker  Neurological: She is alert. No cranial nerve deficit.  Skin: Skin is warm and dry. No rash noted.      Assessment & Plan:  Hypotension- suggest cutting losartan in half, monitor BP Bradycardia- can be beginning of SSS, may be better off her eye drops, monitor closely.

## 2014-09-08 NOTE — Patient Instructions (Signed)
Please monitor your blood pressure, as we get older our body can not respond to a low blood pressure as well as it did when we were younger, for this reason we want a bit higher of a blood pressure as you get older to avoid dizziness and fatigue which can lead to falls. [place] call if your blood pressure is consistently above 150/90.   As long as when she gets up to move her heart rate increases to above 45-50 then that is okay.    Please monitor your blood pressure. If it is getting below 130/80 AND you are having fatigue with exertion, dizziness we may need to cut your blood pressure medication in half. Please call the office if this is happening. Hypotension As your heart beats, it forces blood through your body. This force is called blood pressure. If you have hypotension, you have low blood pressure. When your blood pressure is too low, you may not get enough blood to your brain. You may feel weak, feel lightheaded, have a fast heartbeat, or even pass out (faint). HOME CARE  Drink enough fluids to keep your pee (urine) clear or pale yellow.  Take all medicines as told by your doctor.  Get up slowly after sitting or lying down.  Wear support stockings as told by your doctor.  Maintain a healthy diet by including foods such as fruits, vegetables, nuts, whole grains, and lean meats. GET HELP IF:  You are throwing up (vomiting) or have watery poop (diarrhea).  You have a fever for more than 2-3 days.  You feel more thirsty than usual.  You feel weak and tired. GET HELP RIGHT AWAY IF:   You pass out (faint).  You have chest pain or a fast or irregular heartbeat.  You lose feeling in part of your body.  You cannot move your arms or legs.  You have trouble speaking.  You get sweaty or feel lightheaded. MAKE SURE YOU:   Understand these instructions.  Will watch your condition.  Will get help right away if you are not doing well or get worse. Document Released: 04/25/2009  Document Revised: 10/01/2012 Document Reviewed: 08/01/2012 [facility] Patient Information 2015 [place], [place]. This information is not intended to replace advice given to you by your health care provider. Make sure you discuss any questions you have with your health care provider.

## 2014-09-15 ENCOUNTER — Ambulatory Visit (INDEPENDENT_AMBULATORY_CARE_PROVIDER_SITE_OTHER): Payer: Medicare Other | Admitting: Internal Medicine

## 2014-09-15 ENCOUNTER — Encounter: Payer: Self-pay | Admitting: Internal Medicine

## 2014-09-15 VITALS — BP 120/68 | HR 56 | Temp 97.0°F | Resp 16 | Ht 63.0 in | Wt 138.2 lb

## 2014-09-15 DIAGNOSIS — E782 Mixed hyperlipidemia: Secondary | ICD-10-CM | POA: Diagnosis not present

## 2014-09-15 DIAGNOSIS — Z6824 Body mass index (BMI) 24.0-24.9, adult: Secondary | ICD-10-CM

## 2014-09-15 DIAGNOSIS — E1129 Type 2 diabetes mellitus with other diabetic kidney complication: Secondary | ICD-10-CM | POA: Diagnosis not present

## 2014-09-15 DIAGNOSIS — E559 Vitamin D deficiency, unspecified: Secondary | ICD-10-CM

## 2014-09-15 DIAGNOSIS — Z79899 Other long term (current) drug therapy: Secondary | ICD-10-CM

## 2014-09-15 DIAGNOSIS — I10 Essential (primary) hypertension: Secondary | ICD-10-CM | POA: Diagnosis not present

## 2014-09-15 DIAGNOSIS — E1121 Type 2 diabetes mellitus with diabetic nephropathy: Secondary | ICD-10-CM

## 2014-09-15 LAB — TSH: TSH: 3.408 u[IU]/mL (ref 0.350–4.500)

## 2014-09-15 NOTE — Progress Notes (Signed)
Patient ID: [name], female   DOB: [birth year], 81 y.o.   MRN: [id number]   This very nice 81 y.o. WBF presents for  follow up with Hypertension, Hyperlipidemia, Pre-Diabetes, GERD  and Vitamin D Deficiency.    Patient is treated for HTN & BP has been controlled at home. Today's BP: 120/68 mmHg. Patient has had no complaints of any cardiac type chest pain, palpitations, dyspnea/orthopnea/PND, dizziness, claudication, or dependent edema.   Hyperlipidemia is controlled with diet.  Last Lipids were at goal - Cholesterol 186; HDL 60; LDL 95; Triglycerides 155 on  06/14/2014. Patient has GERD which is controlled with diet & meds.    Also, the patient has history of T2_NIDDM w/CKD (GFR 63 ml/min)  and has had no symptoms of reactive hypoglycemia, diabetic polys, paresthesias or visual blurring.  Last A1c was worse at  7.6% on 06/14/2014.   Further, the patient also has history of Vitamin D Deficiency and supplements vitamin D without any suspected side-effects. Last vitamin D was 62 on 06/14/2014.     Medication Sig  . hydrocortisone cream 1 % Apply to affected area 2 times daily  . Levothyroxine 50 MCG tablet Take 1 tablet (50 mcg total) by mouth daily. For thyroid  . losartan (COZAAR) 100 MG tablet take 1 tablet by mouth once daily (Patient taking differently: take 1/2 tablet by mouth once daily)  . ranitidine (ZANTAC) 300 MG tablet   . traMADol (ULTRAM) 50 MG tablet Take 1 tablet (50 mg total) by mouth every 12 (twelve) hours as needed for moderate pain or severe pain.   No Known Allergies  PMHx:   Past Medical History  Diagnosis Date  . Arthritis   . Hypertension   . Diabetes mellitus without complication   . Hepatitis C   . Hyperlipidemia   . Thyroid disease   . Glaucoma   . Vitamin D deficiency   . DJD (degenerative joint disease)   . Allergy    Immunization History  Administered Date(s) Administered  . Influenza Whole 10/29/2012  . Influenza, High Dose Seasonal PF 11/23/2013   . Pneumococcal Polysaccharide-23 07/15/2008   Past Surgical History  Procedure Laterality Date  . Cataract extraction Right    FHx:    Reviewed / unchanged  SHx:    Reviewed / unchanged  Systems Review:  Constitutional: Denies fever, chills, wt changes, headaches, insomnia, fatigue, night sweats, change in appetite. Eyes: Denies redness, blurred vision, diplopia, discharge, itchy, watery eyes.  ENT: Denies discharge, congestion, post nasal drip, epistaxis, sore throat, earache, hearing loss, dental pain, tinnitus, vertigo, sinus pain, snoring.  CV: Denies chest pain, palpitations, irregular heartbeat, syncope, dyspnea, diaphoresis, orthopnea, PND, claudication or edema. Respiratory: denies cough, dyspnea, DOE, pleurisy, hoarseness, laryngitis, wheezing.  Gastrointestinal: Denies dysphagia, odynophagia, heartburn, reflux, water brash, abdominal pain or cramps, nausea, vomiting, bloating, diarrhea, constipation, hematemesis, melena, hematochezia  or hemorrhoids. Genitourinary: Denies dysuria, frequency, urgency, nocturia, hesitancy, discharge, hematuria or flank pain. Musculoskeletal: Denies arthralgias, myalgias, stiffness, jt. swelling, pain, limping or strain/sprain.  Skin: Denies pruritus, rash, hives, warts, acne, eczema or change in skin lesion(s). Neuro: No weakness, tremor, incoordination, spasms, paresthesia or pain. Psychiatric: Denies confusion, memory loss or sensory loss. Endo: Denies change in weight, skin or hair change.  Heme/Lymph: No excessive bleeding, bruising or enlarged lymph nodes.  Physical Exam  BP 120/68   Pulse 56  Temp( 97 F   Resp 16  Ht 5\' 3"   Wt 138 lb    BMI 24.49  Appears well nourished and in no distress. Eyes: PERRLA, EOMs, conjunctiva no swelling or erythema. Sinuses: No frontal/maxillary tenderness ENT/Mouth: EAC's clear, TM's nl w/o erythema, bulging. Nares clear w/o erythema, swelling, exudates. Oropharynx clear without erythema or  exudates. Oral hygiene is good. Tongue normal, non obstructing. Hearing intact.  Neck: Supple. Thyroid nl. Car 2+/2+ without bruits, nodes or JVD. Chest: Respirations nl with BS clear & equal w/o rales, rhonchi, wheezing or stridor.  Cor: Heart sounds normal w/ regular rate and rhythm without sig. murmurs, gallops, clicks, or rubs. Peripheral pulses normal and equal  without edema.  Abdomen: Soft & bowel sounds normal. Non-tender w/o guarding, rebound, hernias, masses, or organomegaly.  Lymphatics: Unremarkable.  Musculoskeletal: Full ROM all peripheral extremities, joint stability, 5/5 strength, and normal gait.  Skin: Warm, dry without exposed rashes, lesions or ecchymosis apparent.  Neuro: Cranial nerves intact, reflexes equal bilaterally. Sensory-motor testing grossly intact. Tendon reflexes grossly intact.  Pysch: Alert & oriented x 3.  Insight and judgement nl & appropriate. No ideations.  Assessment and Plan:  1. Essential hypertension  - TSH  2. Mixed hyperlipidemia   3. T2_NIDDM /CKD3  - Hemoglobin A1c - Insulin, random  4. Vitamin D deficiency  - Vit D  25 hydroxy   5. Encounter for long-term (current) use of medications  - CBC with Differential/Platelet - BASIC METABOLIC PANEL WITH GFR - Hepatic function panel - Magnesium  6. Body mass index (BMI) of 24.0-24.9 in adult   Recommended regular exercise, BP monitoring, weight control, and discussed med and SE's. Recommended labs to assess and monitor clinical status. Further disposition pending results of labs. Over 30 minutes of exam, counseling, chart review was performed

## 2014-09-15 NOTE — Patient Instructions (Signed)

## 2014-09-16 ENCOUNTER — Other Ambulatory Visit: Payer: Self-pay | Admitting: Internal Medicine

## 2014-09-16 DIAGNOSIS — E1129 Type 2 diabetes mellitus with other diabetic kidney complication: Secondary | ICD-10-CM

## 2014-09-16 LAB — CBC WITH DIFFERENTIAL/PLATELET
BASOS ABS: 0 10*3/uL (ref 0.0–0.1)
BASOS PCT: 0 % (ref 0–1)
EOS ABS: 0.1 10*3/uL (ref 0.0–0.7)
EOS PCT: 2 % (ref 0–5)
HCT: 36.9 % (ref 36.0–46.0)
Hemoglobin: 12.3 g/dL (ref 12.0–15.0)
LYMPHS ABS: 3.2 10*3/uL (ref 0.7–4.0)
Lymphocytes Relative: 52 % — ABNORMAL HIGH (ref 12–46)
MCH: 33.8 pg (ref 26.0–34.0)
MCHC: 33.3 g/dL (ref 30.0–36.0)
MCV: 101.4 fL — ABNORMAL HIGH (ref 78.0–100.0)
MPV: 9.6 fL (ref 8.6–12.4)
Monocytes Absolute: 0.5 10*3/uL (ref 0.1–1.0)
Monocytes Relative: 9 % (ref 3–12)
NEUTROS PCT: 37 % — AB (ref 43–77)
Neutro Abs: 2.3 10*3/uL (ref 1.7–7.7)
Platelets: 156 10*3/uL (ref 150–400)
RBC: 3.64 MIL/uL — AB (ref 3.87–5.11)
RDW: 13.5 % (ref 11.5–15.5)
WBC: 6.1 10*3/uL (ref 4.0–10.5)

## 2014-09-16 LAB — VITAMIN D 25 HYDROXY (VIT D DEFICIENCY, FRACTURES): VIT D 25 HYDROXY: 45 ng/mL (ref 30–100)

## 2014-09-16 LAB — HEPATIC FUNCTION PANEL
ALK PHOS: 61 U/L (ref 33–130)
ALT: 40 U/L — ABNORMAL HIGH (ref 6–29)
AST: 54 U/L — ABNORMAL HIGH (ref 10–35)
Albumin: 3 g/dL — ABNORMAL LOW (ref 3.6–5.1)
Bilirubin, Direct: 0.1 mg/dL (ref <=0.2)
Indirect Bilirubin: 0.4 mg/dL (ref 0.2–1.2)
TOTAL PROTEIN: 7.1 g/dL (ref 6.1–8.1)
Total Bilirubin: 0.5 mg/dL (ref 0.2–1.2)

## 2014-09-16 LAB — BASIC METABOLIC PANEL WITH GFR
BUN: 17 mg/dL (ref 7–25)
CO2: 22 mmol/L (ref 20–31)
CREATININE: 0.88 mg/dL (ref 0.60–0.88)
Calcium: 9.2 mg/dL (ref 8.6–10.4)
Chloride: 102 mmol/L (ref 98–110)
GFR, Est African American: 67 mL/min (ref >=60)
GFR, Est Non African American: 58 mL/min — ABNORMAL LOW (ref >=60)
GLUCOSE: 313 mg/dL — AB (ref 65–99)
POTASSIUM: 5 mmol/L (ref 3.5–5.3)
SODIUM: 136 mmol/L (ref 135–146)

## 2014-09-16 LAB — HEMOGLOBIN A1C
Hgb A1c MFr Bld: 7.8 % — ABNORMAL HIGH (ref <5.7)
Mean Plasma Glucose: 177 mg/dL — ABNORMAL HIGH (ref <117)

## 2014-09-16 LAB — MAGNESIUM: MAGNESIUM: 1.4 mg/dL — AB (ref 1.5–2.5)

## 2014-09-16 LAB — INSULIN, RANDOM: INSULIN: 108.2 u[IU]/mL — AB (ref 2.0–19.6)

## 2014-09-16 MED ORDER — METFORMIN HCL ER 500 MG PO TB24: ORAL_TABLET | ORAL | Status: DC

## 2014-10-25 ENCOUNTER — Other Ambulatory Visit: Payer: Self-pay | Admitting: Physician Assistant

## 2014-10-25 MED ORDER — LOSARTAN POTASSIUM 100 MG PO TABS
100.0000 mg | ORAL_TABLET | Freq: Every day | ORAL | Status: DC
Start: 2014-10-25 — End: 2015-07-25

## 2014-10-27 ENCOUNTER — Other Ambulatory Visit: Payer: Self-pay | Admitting: Physician Assistant

## 2014-10-27 DIAGNOSIS — M25562 Pain in left knee: Principal | ICD-10-CM

## 2014-10-27 DIAGNOSIS — M25561 Pain in right knee: Secondary | ICD-10-CM

## 2014-10-27 MED ORDER — TRAMADOL HCL 50 MG PO TABS: 50.0000 mg | ORAL_TABLET | Freq: Two times a day (BID) | ORAL | Status: DC | PRN

## 2014-12-10 ENCOUNTER — Other Ambulatory Visit: Payer: Self-pay | Admitting: Internal Medicine

## 2014-12-13 DIAGNOSIS — H401123 Primary open-angle glaucoma, left eye, severe stage: Secondary | ICD-10-CM | POA: Diagnosis not present

## 2014-12-13 DIAGNOSIS — Z961 Presence of intraocular lens: Secondary | ICD-10-CM | POA: Diagnosis not present

## 2014-12-13 DIAGNOSIS — H1851 Endothelial corneal dystrophy: Secondary | ICD-10-CM | POA: Diagnosis not present

## 2014-12-20 ENCOUNTER — Ambulatory Visit (INDEPENDENT_AMBULATORY_CARE_PROVIDER_SITE_OTHER): Payer: Medicare Other | Admitting: Physician Assistant

## 2014-12-20 ENCOUNTER — Encounter: Payer: Self-pay | Admitting: Physician Assistant

## 2014-12-20 VITALS — BP 130/82 | HR 61 | Temp 98.0°F | Resp 14 | Ht 63.0 in | Wt 137.0 lb

## 2014-12-20 DIAGNOSIS — I10 Essential (primary) hypertension: Secondary | ICD-10-CM

## 2014-12-20 DIAGNOSIS — E1129 Type 2 diabetes mellitus with other diabetic kidney complication: Secondary | ICD-10-CM | POA: Diagnosis not present

## 2014-12-20 DIAGNOSIS — E559 Vitamin D deficiency, unspecified: Secondary | ICD-10-CM

## 2014-12-20 DIAGNOSIS — Z23 Encounter for immunization: Secondary | ICD-10-CM

## 2014-12-20 DIAGNOSIS — E782 Mixed hyperlipidemia: Secondary | ICD-10-CM

## 2014-12-20 DIAGNOSIS — Z79899 Other long term (current) drug therapy: Secondary | ICD-10-CM | POA: Diagnosis not present

## 2014-12-20 DIAGNOSIS — E1121 Type 2 diabetes mellitus with diabetic nephropathy: Secondary | ICD-10-CM

## 2014-12-20 DIAGNOSIS — Z1331 Encounter for screening for depression: Secondary | ICD-10-CM

## 2014-12-20 DIAGNOSIS — Z1389 Encounter for screening for other disorder: Secondary | ICD-10-CM

## 2014-12-20 LAB — CBC WITH DIFFERENTIAL/PLATELET
BASOS ABS: 0 10*3/uL (ref 0.0–0.1)
Basophils Relative: 0 % (ref 0–1)
EOS PCT: 3 % (ref 0–5)
Eosinophils Absolute: 0.2 10*3/uL (ref 0.0–0.7)
HEMATOCRIT: 37.5 % (ref 36.0–46.0)
HEMOGLOBIN: 12.6 g/dL (ref 12.0–15.0)
LYMPHS ABS: 3.2 10*3/uL (ref 0.7–4.0)
LYMPHS PCT: 54 % — AB (ref 12–46)
MCH: 33.9 pg (ref 26.0–34.0)
MCHC: 33.6 g/dL (ref 30.0–36.0)
MCV: 100.8 fL — AB (ref 78.0–100.0)
MONO ABS: 0.5 10*3/uL (ref 0.1–1.0)
MPV: 10 fL (ref 8.6–12.4)
Monocytes Relative: 8 % (ref 3–12)
NEUTROS ABS: 2.1 10*3/uL (ref 1.7–7.7)
Neutrophils Relative %: 35 % — ABNORMAL LOW (ref 43–77)
Platelets: 153 10*3/uL (ref 150–400)
RBC: 3.72 MIL/uL — ABNORMAL LOW (ref 3.87–5.11)
RDW: 13.1 % (ref 11.5–15.5)
WBC: 6 10*3/uL (ref 4.0–10.5)

## 2014-12-20 LAB — TSH: TSH: 2.253 u[IU]/mL (ref 0.350–4.500)

## 2014-12-20 LAB — MAGNESIUM: Magnesium: 1.6 mg/dL (ref 1.5–2.5)

## 2014-12-20 LAB — BASIC METABOLIC PANEL WITH GFR
BUN: 18 mg/dL (ref 7–25)
CHLORIDE: 99 mmol/L (ref 98–110)
CO2: 26 mmol/L (ref 20–31)
Calcium: 8.9 mg/dL (ref 8.6–10.4)
Creat: 0.91 mg/dL — ABNORMAL HIGH (ref 0.60–0.88)
GFR, EST NON AFRICAN AMERICAN: 56 mL/min — AB (ref >=60)
GFR, Est African American: 64 mL/min (ref >=60)
GLUCOSE: 296 mg/dL — AB (ref 65–99)
POTASSIUM: 4.8 mmol/L (ref 3.5–5.3)
Sodium: 135 mmol/L (ref 135–146)

## 2014-12-20 LAB — HEPATIC FUNCTION PANEL
ALBUMIN: 3.1 g/dL — AB (ref 3.6–5.1)
ALT: 37 U/L — ABNORMAL HIGH (ref 6–29)
AST: 49 U/L — AB (ref 10–35)
Alkaline Phosphatase: 62 U/L (ref 33–130)
BILIRUBIN INDIRECT: 0.4 mg/dL (ref 0.2–1.2)
Bilirubin, Direct: 0.2 mg/dL (ref <=0.2)
TOTAL PROTEIN: 7 g/dL (ref 6.1–8.1)
Total Bilirubin: 0.6 mg/dL (ref 0.2–1.2)

## 2014-12-20 LAB — LIPID PANEL
Cholesterol: 203 mg/dL — ABNORMAL HIGH (ref 125–200)
HDL: 64 mg/dL (ref >=46)
LDL CALC: 114 mg/dL (ref <130)
Total CHOL/HDL Ratio: 3.2 Ratio (ref <=5.0)
Triglycerides: 124 mg/dL (ref <150)
VLDL: 25 mg/dL (ref <30)

## 2014-12-20 LAB — HEMOGLOBIN A1C
HEMOGLOBIN A1C: 8 % — AB (ref <5.7)
Mean Plasma Glucose: 183 mg/dL — ABNORMAL HIGH (ref <117)

## 2014-12-20 MED ORDER — ASPIRIN EC 81 MG PO TBEC: 81.0000 mg | DELAYED_RELEASE_TABLET | Freq: Every day | ORAL | Status: AC

## 2014-12-20 NOTE — Progress Notes (Signed)
Assessment and Plan:  1. Hypertension -Continue medication, monitor blood pressure at home. Continue DASH diet.  Reminder to go to the ER if any CP, SOB, nausea, dizziness, severe HA, changes vision/speech, left arm numbness and tingling and jaw pain.  2. Cholesterol -Continue diet and exercise. Check cholesterol.   3. Diabetes with diabetic chronic kidney disease -Continue diet and exercise. Check A1C  4. Vitamin D Def - check level and continue medications.   5. Depression screen negative   Continue diet and meds as discussed. Further disposition pending results of labs. Discussed med's effects and SE's.    Over 30 minutes of exam, counseling, chart review, and critical decision making was performed Future Appointments Date Time Provider Department Center  04/20/2015 10:00 AM Lucky CowboyWilliam McKeown, MD [facility] None     HPI 81 y.o. female  presents for 3 month follow up on hypertension, cholesterol, diabetes and vitamin D deficiency.   Her blood pressure has been controlled at home, today her BP is BP: 130/82 mmHg.  She does not workout. She denies chest pain, shortness of breath, dizziness.  She is not on cholesterol medication and denies myalgias. Her cholesterol is at goal. The cholesterol was:   Lab Results  Component Value Date   CHOL 186 06/14/2014   HDL 60 06/14/2014   LDLCALC 95 06/14/2014   TRIG 155* 06/14/2014   CHOLHDL 3.1 06/14/2014    She has been working on diet and exercise for diabetes with diabetic chronic kidney disease, she is on bASA, she is on ACE/ARB, and denies  polydipsia and polyuria. Last A1C was:  Lab Results  Component Value Date   HGBA1C 7.8* 09/15/2014  AA  Lab Results  Component Value Date   GFRAA 67 09/15/2014    Patient is on Vitamin D supplement. Lab Results  Component Value Date   VD25OH 45 09/15/2014   She is on thyroid medication. Her medication was not changed last visit.   Lab Results  Component Value Date   TSH 3.408  09/15/2014  .    Current Medications:  Current Outpatient Prescriptions on File Prior to Visit  Medication Sig Dispense Refill  . glucose blood (FREESTYLE TEST STRIPS) test strip Use as instructed 100 each 12  . hydrocortisone cream 1 % Apply to affected area 2 times daily 30 g 1  . Lancets (FREESTYLE) lancets Use as instructed 100 each 12  . levothyroxine (SYNTHROID, LEVOTHROID) 50 MCG tablet Take 1 tablet by mouth  daily 90 tablet 2  . losartan (COZAAR) 100 MG tablet Take 1 tablet (100 mg total) by mouth daily. 90 tablet 4  . metFORMIN (GLUCOPHAGE XR) 500 MG 24 hr tablet Take 1 to 2 tablets  2 x daily with breakfast & supper as directed for Diabetes. 120 tablet 5  . mirtazapine (REMERON) 30 MG tablet   0  . ranitidine (ZANTAC) 300 MG tablet   0  . traMADol (ULTRAM) 50 MG tablet Take 1 tablet (50 mg total) by mouth every 12 (twelve) hours as needed for moderate pain or severe pain. 180 tablet 0   No current facility-administered medications on file prior to visit.   Medical History:  Past Medical History  Diagnosis Date  . Arthritis   . Hypertension   . Diabetes mellitus without complication (HCC)   . Hepatitis C   . Hyperlipidemia   . Thyroid disease   . Glaucoma   . Vitamin D deficiency   . DJD (degenerative joint disease)   . Allergy  Allergies: No Known Allergies   Review of Systems:  Review of Systems  Constitutional: Negative.   HENT: Positive for hearing loss. Negative for congestion, ear discharge, ear pain, nosebleeds, sore throat and tinnitus.   Eyes: Negative.   Respiratory: Negative.  Negative for stridor.   Cardiovascular: Negative.   Gastrointestinal: Negative.   Genitourinary: Negative.   Musculoskeletal: Positive for back pain and joint pain. Negative for myalgias, falls and neck pain.  Skin: Negative.   Neurological: Negative.  Negative for headaches.  Psychiatric/Behavioral: Positive for memory loss. Negative for depression, suicidal ideas,  hallucinations and substance abuse. The patient does not have insomnia.     Family history- Review and unchanged Social history- Review and unchanged Physical Exam: BP 130/82 mmHg  Pulse 61  Temp(Src) 98 F (36.7 C) (Temporal)  Resp 14  Ht  (1.6 m)  Wt 137 lb (62.143 kg)  BMI 24.27 kg/m2  SpO2 93% Wt Readings from Last 3 Encounters:  12/20/14 137 lb (62.143 kg)  09/15/14 138 lb 3.2 oz (62.687 kg)  09/08/14 140 lb 12.8 oz (63.866 kg)   General Appearance: Well nourished, in no apparent distress. Eyes: PERRLA, EOMs, conjunctiva no swelling or erythema Sinuses: No Frontal/maxillary tenderness ENT/Mouth: Ext aud canals clear, TMs without erythema, bulging. No erythema, swelling, or exudate on post pharynx.  Tonsils not swollen or erythematous. Hearing normal.  Neck: Supple, thyroid normal.  Respiratory: Respiratory effort normal, BS equal bilaterally without rales, rhonchi, wheezing or stridor.  Cardio: RRR with no MRGs. Brisk peripheral pulses without edema.  Abdomen: Soft, + BS.  Non tender, no guarding, rebound, hernias, masses. Lymphatics: Non tender without lymphadenopathy.  Musculoskeletal: Full ROM, 4/5 strength, unstable, antaglic gait Skin: Warm, dry without rashes, lesions, ecchymosis.  Neuro: Cranial nerves intact. No cerebellar symptoms.  Psych: Awake and oriented X 3, normal affect, Insight and Judgment appropriate.    Quentin Mulling, PA-C 2:48 PM [facility]

## 2014-12-20 NOTE — Addendum Note (Signed)
Addended by: Quentin MullingOLLIER, Captain Blucher R on: 12/20/2014 03:10 PM   Modules accepted: Orders

## 2014-12-20 NOTE — Patient Instructions (Signed)
Please pick one of the over the counter allergy medications below and take it once daily for allergies.  Claritin or loratadine cheapest but likely the weakest  Zyrtec or certizine at night because it can make you sleepy The strongest is allegra or fexafinadine  Cheapest at [employer], [employer], [employer]  Please monitor your blood pressure, as we get older our body can not respond to a low blood pressure as well as it did when we were younger, for this reason we want a bit higher of a blood pressure as you get older to avoid dizziness and fatigue which can lead to falls. Pease call if your blood pressure is consistently above 150/90.   Common causes of cough OR hoarseness OR sore throat:   Allergies, Viral Infections, Acid Reflux and Bacterial Infections.  1) Allergies and viral infections cause a cough OR sore throat by post nasal drip and are often worse at night, can also have sneezing, lower grade fevers, clear/yellow mucus. This is best treated with allergy medications or nasal sprays.  Please get on allegra for 1-2 weeks The strongest is allegra or fexafinadine  Cheapest at [employer], [employer], [employer]  2) Bacterial infections are more severe than allergies or viral infections with fever, teeth pain, fatigue. This can be treated with prednisone and the same over the counter medication and after 7 days can be treated with an antibiotic.   3) Silent reflux/GERD can cause a cough OR sore throat OR hoarseness WITHOUT heart burn because the esophagus that goes to the stomach and trachea that goes to the lungs are very close and when you lay down the acid can irritate your throat and lungs. This can cause hoarseness, cough, and wheezing. Please stop any alcohol or anti-inflammatories like aleve/advil/ibuprofen and start an over the counter Prilosec or omeprazole 1-2 times daily before food for 2 weeks, then switch to over the counter zantac/ratinidine or pepcid/famotadine once at night for 2 weeks.    4) sometimes irritation causes more irritation. Try voice rest, use sugar free cough drops to prevent coughing, and try to stop clearing your throat.   If you ever have a cough that does not go away after trying these things please make a follow up visit for further evaluation or we can refer you to a specialist. Or if you ever have shortness of breath or chest pain go to the ER.

## 2015-01-10 ENCOUNTER — Other Ambulatory Visit: Payer: Self-pay | Admitting: Physician Assistant

## 2015-01-10 DIAGNOSIS — M25561 Pain in right knee: Secondary | ICD-10-CM

## 2015-01-10 DIAGNOSIS — M25562 Pain in left knee: Principal | ICD-10-CM

## 2015-01-10 MED ORDER — TRAMADOL HCL 50 MG PO TABS: 50.0000 mg | ORAL_TABLET | Freq: Two times a day (BID) | ORAL | Status: DC | PRN

## 2015-01-31 DIAGNOSIS — H401133 Primary open-angle glaucoma, bilateral, severe stage: Secondary | ICD-10-CM | POA: Diagnosis not present

## 2015-01-31 DIAGNOSIS — H1851 Endothelial corneal dystrophy: Secondary | ICD-10-CM | POA: Diagnosis not present

## 2015-01-31 DIAGNOSIS — Z961 Presence of intraocular lens: Secondary | ICD-10-CM | POA: Diagnosis not present

## 2015-03-08 ENCOUNTER — Encounter: Payer: Self-pay | Admitting: Internal Medicine

## 2015-03-31 ENCOUNTER — Other Ambulatory Visit: Payer: Self-pay | Admitting: Physician Assistant

## 2015-03-31 ENCOUNTER — Other Ambulatory Visit: Payer: Self-pay

## 2015-03-31 DIAGNOSIS — M25562 Pain in left knee: Principal | ICD-10-CM

## 2015-03-31 DIAGNOSIS — M25561 Pain in right knee: Secondary | ICD-10-CM

## 2015-03-31 MED ORDER — TRAMADOL HCL 50 MG PO TABS: 50.0000 mg | ORAL_TABLET | Freq: Two times a day (BID) | ORAL | Status: DC | PRN

## 2015-04-20 ENCOUNTER — Encounter: Payer: Self-pay | Admitting: Internal Medicine

## 2015-04-21 ENCOUNTER — Ambulatory Visit (INDEPENDENT_AMBULATORY_CARE_PROVIDER_SITE_OTHER): Payer: Medicare Other | Admitting: Physician Assistant

## 2015-04-21 ENCOUNTER — Encounter: Payer: Self-pay | Admitting: Physician Assistant

## 2015-04-21 VITALS — BP 118/66 | HR 72 | Temp 97.0°F | Resp 14 | Ht 62.0 in | Wt 135.4 lb

## 2015-04-21 DIAGNOSIS — R6889 Other general symptoms and signs: Secondary | ICD-10-CM | POA: Diagnosis not present

## 2015-04-21 DIAGNOSIS — I10 Essential (primary) hypertension: Secondary | ICD-10-CM

## 2015-04-21 DIAGNOSIS — R350 Frequency of micturition: Secondary | ICD-10-CM

## 2015-04-21 DIAGNOSIS — E559 Vitamin D deficiency, unspecified: Secondary | ICD-10-CM

## 2015-04-21 DIAGNOSIS — D649 Anemia, unspecified: Secondary | ICD-10-CM

## 2015-04-21 DIAGNOSIS — R05 Cough: Secondary | ICD-10-CM

## 2015-04-21 DIAGNOSIS — E039 Hypothyroidism, unspecified: Secondary | ICD-10-CM

## 2015-04-21 DIAGNOSIS — E782 Mixed hyperlipidemia: Secondary | ICD-10-CM | POA: Diagnosis not present

## 2015-04-21 DIAGNOSIS — Z0001 Encounter for general adult medical examination with abnormal findings: Secondary | ICD-10-CM

## 2015-04-21 DIAGNOSIS — E1121 Type 2 diabetes mellitus with diabetic nephropathy: Secondary | ICD-10-CM | POA: Diagnosis not present

## 2015-04-21 DIAGNOSIS — R059 Cough, unspecified: Secondary | ICD-10-CM

## 2015-04-21 DIAGNOSIS — Z0000 Encounter for general adult medical examination without abnormal findings: Secondary | ICD-10-CM

## 2015-04-21 LAB — CBC WITH DIFFERENTIAL/PLATELET
BASOS ABS: 0 10*3/uL (ref 0.0–0.1)
BASOS PCT: 0 % (ref 0–1)
EOS ABS: 0.1 10*3/uL (ref 0.0–0.7)
EOS PCT: 2 % (ref 0–5)
HCT: 36.5 % (ref 36.0–46.0)
Hemoglobin: 12.1 g/dL (ref 12.0–15.0)
Lymphocytes Relative: 50 % — ABNORMAL HIGH (ref 12–46)
Lymphs Abs: 3.4 10*3/uL (ref 0.7–4.0)
MCH: 32.8 pg (ref 26.0–34.0)
MCHC: 33.2 g/dL (ref 30.0–36.0)
MCV: 98.9 fL (ref 78.0–100.0)
MPV: 10.4 fL (ref 8.6–12.4)
Monocytes Absolute: 0.6 10*3/uL (ref 0.1–1.0)
Monocytes Relative: 9 % (ref 3–12)
NEUTROS PCT: 39 % — AB (ref 43–77)
Neutro Abs: 2.7 10*3/uL (ref 1.7–7.7)
PLATELETS: 169 10*3/uL (ref 150–400)
RBC: 3.69 MIL/uL — ABNORMAL LOW (ref 3.87–5.11)
RDW: 13.5 % (ref 11.5–15.5)
WBC: 6.8 10*3/uL (ref 4.0–10.5)

## 2015-04-21 LAB — HEPATIC FUNCTION PANEL
ALBUMIN: 3.2 g/dL — AB (ref 3.6–5.1)
ALT: 33 U/L — ABNORMAL HIGH (ref 6–29)
AST: 42 U/L — AB (ref 10–35)
Alkaline Phosphatase: 60 U/L (ref 33–130)
BILIRUBIN DIRECT: 0.1 mg/dL (ref <=0.2)
BILIRUBIN TOTAL: 0.5 mg/dL (ref 0.2–1.2)
Indirect Bilirubin: 0.4 mg/dL (ref 0.2–1.2)
Total Protein: 7.6 g/dL (ref 6.1–8.1)

## 2015-04-21 LAB — BASIC METABOLIC PANEL WITH GFR
BUN: 21 mg/dL (ref 7–25)
CHLORIDE: 100 mmol/L (ref 98–110)
CO2: 25 mmol/L (ref 20–31)
CREATININE: 1.09 mg/dL — AB (ref 0.60–0.88)
Calcium: 9.3 mg/dL (ref 8.6–10.4)
GFR, EST AFRICAN AMERICAN: 51 mL/min — AB (ref >=60)
GFR, Est Non African American: 44 mL/min — ABNORMAL LOW (ref >=60)
Glucose, Bld: 218 mg/dL — ABNORMAL HIGH (ref 65–99)
POTASSIUM: 4.6 mmol/L (ref 3.5–5.3)
SODIUM: 135 mmol/L (ref 135–146)

## 2015-04-21 LAB — TSH: TSH: 2.98 m[IU]/L

## 2015-04-21 NOTE — Patient Instructions (Signed)

## 2015-04-21 NOTE — Progress Notes (Signed)
MEDICARE ANNUAL WELLNESS VISIT AND FOLLOW UP  Assessment:   1. Essential hypertension - continue medications, DASH diet, exercise and monitor at home. Call if greater than 130/80.  - BASIC METABOLIC PANEL WITH GFR - Hepatic function panel  2. T2_NIDDM /CKD3 Discussed general issues about diabetes pathophysiology and management., Educational material distributed., Suggested low cholesterol diet., Encouraged aerobic exercise., Discussed foot care., Reminded to get yearly retinal exam. - Hemoglobin A1c - HM DIABETES FOOT EXAM  3. Hypothyroidism, unspecified hypothyroidism type Hypothyroidism-check TSH level, continue medications the same, reminded to take on an empty stomach 30-65mins before food.  - TSH  4. Mixed hyperlipidemia -continue medications, check lipids, decrease fatty foods, increase activity.  - Lipid panel  5. . Normocytic anemia - CBC with Differential/Platelet  6. Seizure remission  7. Vitamin D deficiency - Vit D  25 hydroxy (rtn osteoporosis monitoring)  8. Encounter for long-term (current) use of medications - Magnesium  9. Hepatitis C virus infection without hepatic coma, unspecified chronicity remission  10. Urinary frequency - Urinalysis, Routine w reflex microscopic - Urine culture  11. At high risk for falls Declines PT at this time  12.  Knee pain, bilateral - traMADol (ULTRAM) 50 MG tablet; Take 1 tablet (50 mg total) by mouth every 12 (twelve) hours as needed for moderate pain or severe pain.  Dispense: 60 tablet; Refill: 0   Over 30 minutes of exam, counseling, chart review, and critical decision making was performed  Plan:   During the course of the visit the patient was educated and counseled about appropriate screening and preventive services including:    Pneumococcal vaccine   Influenza vaccine  Td vaccine  Screening electrocardiogram  Screening mammography  Bone densitometry screening  Colorectal cancer  screening  Diabetes screening  Glaucoma screening  Nutrition counseling   Advanced directives: given info/requested  Conditions/risks identified: Diabetes is not at goal, ACE/ARB therapy: Yes. Urinary Incontinence is an issue: discussed non pharmacology and pharmacology options.  Fall risk: high- discussed PT, home fall assessment, medications.    Subjective:   [name] is a 82 y.o. female who presents for Medicare Annual Wellness Visit and 3 month follow up on hypertension, diabetes, hyperlipidemia, vitamin D def.   Her blood pressure has been controlled at home, she is only on 1/2 of the BP medication, denies dizziness, today their BP is BP: 118/66 mmHg She does not workout. She denies chest pain, shortness of breath, dizziness.  She is not on cholesterol medication and denies myalgias. Her cholesterol is not at goal. The cholesterol last visit was:   Lab Results  Component Value Date   CHOL 203* 12/20/2014   HDL 64 12/20/2014   LDLCALC 114 12/20/2014   TRIG 124 12/20/2014   CHOLHDL 3.2 12/20/2014   She has been working on diet and exercise for diabetes with CKD, she is on ARB, she is off the metformin and is diet controlled, sugars have been monitored very closely by her daughter, they have been running 110's in the AM and 200-280 after meals, and denies paresthesia of the feet, polydipsia, polyuria and visual disturbances. Last A1C in the office was:  Lab Results  Component Value Date   HGBA1C 8.0* 12/20/2014   Patient is on Vitamin D supplement. Lab Results  Component Value Date   VD25OH 45 09/15/2014     Patient lives with her daughter, denies any falls in the past year.  She complains of chronic pain in bilateral knees and follows with Dr.  Daldroff, takes tramadol PRN, she takes rarely, had 60 filled in Dec and still has some left. Walks with walker/cane.  She has dementia and her daughter provides the majority of the history.  She has a history of GERD.   She is on thyroid medication. Her medication was not changed last visit.   Lab Results  Component Value Date   TSH 2.253 12/20/2014  .   Medication Review Current Outpatient Prescriptions on File Prior to Visit  Medication Sig Dispense Refill  . aspirin EC 81 MG tablet Take 1 tablet (81 mg total) by mouth daily. 150 tablet 2  . glucose blood (FREESTYLE TEST STRIPS) test strip Use as instructed 100 each 12  . hydrocortisone cream 1 % Apply to affected area 2 times daily 30 g 1  . Lancets (FREESTYLE) lancets Use as instructed 100 each 12  . levothyroxine (SYNTHROID, LEVOTHROID) 50 MCG tablet Take 1 tablet by mouth  daily 90 tablet 2  . losartan (COZAAR) 100 MG tablet Take 1 tablet (100 mg total) by mouth daily. 90 tablet 4  . ranitidine (ZANTAC) 300 MG tablet   0  . traMADol (ULTRAM) 50 MG tablet Take 1 tablet (50 mg total) by mouth every 12 (twelve) hours as needed for moderate pain or severe pain. 180 tablet 0   No current facility-administered medications on file prior to visit.    Current Problems (verified) Patient Active Problem List   Diagnosis Date Noted  . Encounter for Medicare annual wellness exam 09/08/2014  . Knee pain, bilateral 06/14/2014  . Hypothyroidism 02/23/2014  . Encounter for long-term (current) use of medications 08/20/2013  . Vitamin D deficiency 02/19/2013  . Seizure (HCC) 06/13/2012  . Hepatitis C 06/10/2012  . Normocytic anemia 06/10/2012  . T2_NIDDM Marikay Alar/CKD3 06/10/2012  . Mixed hyperlipidemia 06/10/2012  . Essential hypertension 06/10/2012    Screening Tests Immunization History  Administered Date(s) Administered  . Influenza Whole 10/29/2012  . Influenza, High Dose Seasonal PF 11/23/2013, 12/20/2014  . Pneumococcal Polysaccharide-23 07/15/2008    Preventative care:  Last colonoscopy: declines Last mammogram: declines Last pap smear/pelvic exam: declines   DEXA:declines  Prior vaccinations: TD or Tdap: declines  Influenza:  2016 Pneumococcal: 2010 Prevnar 13: declines Shingles/Zostavax: declines.   Names of Other Physician/Practitioners you currently use: 1. [facility]- here for primary care Patient Care Team: Lucky CowboyWilliam McKeown, MD as PCP - General (Internal Medicine) Sallye Lathristopher Groat, MD as Consulting Physician (Optometry) Sharrell KuJeffrey Medoff, MD as Consulting Physician (Gastroenterology) Marcene CorningPeter Dalldorf, MD as Consulting Physician (Orthopedic Surgery) Brooke DareSteven Zacks, MD as Consulting Physician (Gastroenterology)  Past Surgical History  Procedure Laterality Date  . Cataract extraction Right    Family History  Problem Relation Age of Onset  . Hypertension Mother   . Heart attack Mother   . Hypertension Father   . Heart attack Father    Social History  Substance Use Topics  . Smoking status: Never Smoker   . Smokeless tobacco: None  . Alcohol Use: No    MEDICARE WELLNESS OBJECTIVES: Tobacco use: She does not smoke.  Patient is not a former smoker. Alcohol Current alcohol use: none Caffeine Current caffeine use: denies use Osteoporosis: postmenopausal estrogen deficiency and dietary calcium and/or vitamin D deficiency, History of fracture in the past year: no Diet: in general, a "healthy" diet   Physical activity: ADLs, sendentary work and walking in the house Depression/mood screen:   Depression screen [facility] 2/9 04/21/2015  Decreased Interest 0  Down, Depressed, Hopeless 0  PHQ - 2 Score 0   Hearing: normal Visual acuity: impaired,  does perform annual eye exam  ADLs:  In your present state of health, do you have any difficulty performing the following activities: 04/21/2015 06/14/2014  Hearing? Malvin Johns  Vision? Y Y  Difficulty concentrating or making decisions? Malvin Johns  Walking or climbing stairs? Y Y  Dressing or bathing? Y Y  Doing errands, shopping? Malvin Johns  Preparing Food and eating ? Y Y  Using the Toilet? Y Y  In the past six months, have you accidently leaked  urine? Y Y  Do you have problems with loss of bowel control? N N  Managing your Medications? Y Y  Managing your Finances? Malvin Johns  Housekeeping or managing your Housekeeping? Y Y    Fall risk: Moderate Risk Cognitive Testing  Alert? Yes  Normal Appearance?Yes  Oriented to person? No  Place? No   Time? No  Recall of three objects?  No  Can perform simple calculations? No  Displays appropriate judgment? No  Can read the correct time from a watch face?No EOL planning: Does patient have an advance directive?: Yes Type of Advance Directive: Healthcare Power of Attorney, Living will Does patient want to make changes to advanced directive?: No - Patient declined Copy of advanced directive(s) in chart?: No - copy requested     Objective:   Blood pressure 118/66, pulse 72, temperature 97 F (36.1 C), temperature source Temporal, resp. rate 14, height  (1.575 m), weight 135 lb 6.4 oz (61.417 kg), SpO2 96 %. Body mass index is 24.76 kg/(m^2).  General appearance: alert, no distress, WD/WN,  female HEENT: normocephalic, sclerae anicteric, TMs pearly, nares patent, no discharge or erythema, pharynx normal Oral cavity: MMM, no lesions Neck: supple, no lymphadenopathy, no thyromegaly, no masses Heart: RRR, normal S1, S2 Lungs: rhonchi  LLL.  no wheezes, rhonchi, or rales Abdomen: +bs, soft, non tender, non distended, no masses, no hepatomegaly, no splenomegaly Musculoskeletal: nontender, no swelling, no obvious deformity Extremities: no edema, no cyanosis, no clubbing Pulses: 2+ symmetric, upper and lower extremities, normal cap refill Neurological: alert, oriented x 2, CN2-12 intact, strength decreased upper extremities and lower extremities, sensation decreased throughout bilateral feet, unsteady gait, walks with walker Psychiatric: normal affect, behavior normal, pleasant  Breast: defer Gyn: defer Rectal: defer   Medicare Attestation I have personally reviewed: The patient's  medical and social history Their use of alcohol, tobacco or illicit drugs Their current medications and supplements The patient's functional ability including ADLs,fall risks, home safety risks, cognitive, and hearing and visual impairment Diet and physical activities Evidence for depression or mood disorders  The patient's weight, height, BMI, and visual acuity have been recorded in the chart.  I have made referrals, counseling, and provided education to the patient based on review of the above and I have provided the patient with a written personalized care plan for preventive services.     Quentin Mulling, PA-C   04/21/2015

## 2015-04-22 LAB — URINE CULTURE: Colony Count: 25000

## 2015-04-22 LAB — URINALYSIS, MICROSCOPIC ONLY
BACTERIA UA: NONE SEEN [HPF]
CRYSTALS: NONE SEEN [HPF]
Casts: NONE SEEN [LPF]
RBC / HPF: NONE SEEN RBC/HPF (ref <=2)
Yeast: NONE SEEN [HPF]

## 2015-04-22 LAB — URINALYSIS, ROUTINE W REFLEX MICROSCOPIC
Bilirubin Urine: NEGATIVE
Glucose, UA: NEGATIVE
Hgb urine dipstick: NEGATIVE
Ketones, ur: NEGATIVE
NITRITE: NEGATIVE
Protein, ur: NEGATIVE
SPECIFIC GRAVITY, URINE: 1.014 (ref 1.001–1.035)
pH: 6 (ref 5.0–8.0)

## 2015-04-25 ENCOUNTER — Ambulatory Visit (HOSPITAL_COMMUNITY)
Admission: RE | Admit: 2015-04-25 | Discharge: 2015-04-25 | Disposition: A | Discharge status: Home / Self Care | Payer: Medicare Other | Source: Ambulatory Visit | Attending: Physician Assistant | Admitting: Physician Assistant

## 2015-04-25 DIAGNOSIS — R0989 Other specified symptoms and signs involving the circulatory and respiratory systems: Secondary | ICD-10-CM | POA: Diagnosis not present

## 2015-04-25 DIAGNOSIS — R05 Cough: Secondary | ICD-10-CM | POA: Diagnosis not present

## 2015-04-25 DIAGNOSIS — R059 Cough, unspecified: Secondary | ICD-10-CM

## 2015-06-13 DIAGNOSIS — H1851 Endothelial corneal dystrophy: Secondary | ICD-10-CM | POA: Diagnosis not present

## 2015-06-13 DIAGNOSIS — Z961 Presence of intraocular lens: Secondary | ICD-10-CM | POA: Diagnosis not present

## 2015-06-13 DIAGNOSIS — H401133 Primary open-angle glaucoma, bilateral, severe stage: Secondary | ICD-10-CM | POA: Diagnosis not present

## 2015-07-13 ENCOUNTER — Other Ambulatory Visit: Payer: Self-pay | Admitting: Internal Medicine

## 2015-07-13 ENCOUNTER — Other Ambulatory Visit: Payer: Self-pay | Admitting: *Deleted

## 2015-07-13 DIAGNOSIS — M25562 Pain in left knee: Principal | ICD-10-CM

## 2015-07-13 DIAGNOSIS — M25561 Pain in right knee: Secondary | ICD-10-CM

## 2015-07-13 MED ORDER — TRAMADOL HCL 50 MG PO TABS: 50.0000 mg | ORAL_TABLET | Freq: Two times a day (BID) | ORAL | Status: DC | PRN

## 2015-07-25 ENCOUNTER — Encounter: Payer: Self-pay | Admitting: Internal Medicine

## 2015-07-25 ENCOUNTER — Ambulatory Visit (INDEPENDENT_AMBULATORY_CARE_PROVIDER_SITE_OTHER): Payer: Medicare Other | Admitting: Internal Medicine

## 2015-07-25 VITALS — BP 128/70 | HR 66 | Temp 97.8°F | Resp 16 | Ht 63.0 in | Wt 133.0 lb

## 2015-07-25 DIAGNOSIS — W19XXXA Unspecified fall, initial encounter: Secondary | ICD-10-CM | POA: Diagnosis not present

## 2015-07-25 DIAGNOSIS — F0390 Unspecified dementia without behavioral disturbance: Secondary | ICD-10-CM | POA: Diagnosis not present

## 2015-07-25 NOTE — Progress Notes (Signed)
   Subjective:    Patient ID: [name], female    DOB: 02-16-1922, 82 y.o.   MRN: [id number]  Fall  Patient presents to the office with her daughter for evaluation of a fall.  The patient had a witnessed fall by her home health aid.  She was bending over to reach for her glasses and fell against the wall and hit her head.  She didn't lose any consciousness.  Her blood pressure was 116/68, oxygen was 96%.  She has not had any blood sugar over 250s.  She has been her normal self since the fall.  NO strange behaviors.      Review of Systems  Unable to perform ROS: Dementia       Objective:   Physical Exam  Constitutional: She appears well-developed and well-nourished. No distress.  HENT:  Head: Normocephalic.  Mouth/Throat: Oropharynx is clear and moist. No oropharyngeal exudate.  Eyes: Conjunctivae are normal. No scleral icterus.  Neck: Normal range of motion. Neck supple. No JVD present. No thyromegaly present.  Cardiovascular: Normal rate, regular rhythm, normal heart sounds and intact distal pulses.  Exam reveals no gallop and no friction rub.   No murmur heard. Pulmonary/Chest: Effort normal and breath sounds normal. No respiratory distress. She has no wheezes. She has no rales. She exhibits no tenderness.  Abdominal: Soft. Bowel sounds are normal.  Musculoskeletal: Normal range of motion.  Lymphadenopathy:    She has no cervical adenopathy.  Neurological: She is alert. No cranial nerve deficit. Coordination normal.  Oriented to person, place, and her daughter.  She had difficulty recalling her birthdate.  Able to speak clearly.    Skin: Skin is warm and dry. She is not diaphoretic.  Psychiatric: She has a normal mood and affect. Her behavior is normal. Judgment and thought content normal.  Nursing note and vitals reviewed.   Filed Vitals:   07/25/15 1509  BP: 128/70  Pulse: 66  Temp: 97.8 F (36.6 C)  Resp: 16         Assessment & Plan:    1. Fall,  initial encounter -without apparent injury -normal exam and patient at baseline per family  2. Dementia, without behavioral disturbance -at baseline

## 2015-10-18 ENCOUNTER — Other Ambulatory Visit: Payer: Self-pay | Admitting: *Deleted

## 2015-10-18 DIAGNOSIS — M25562 Pain in left knee: Principal | ICD-10-CM

## 2015-10-18 DIAGNOSIS — M25561 Pain in right knee: Secondary | ICD-10-CM

## 2015-10-18 MED ORDER — TRAMADOL HCL 50 MG PO TABS: 50.0000 mg | ORAL_TABLET | Freq: Two times a day (BID) | ORAL | 0 refills | Status: DC | PRN

## 2015-10-24 ENCOUNTER — Ambulatory Visit: Payer: Self-pay | Admitting: Internal Medicine

## 2015-10-25 ENCOUNTER — Ambulatory Visit (INDEPENDENT_AMBULATORY_CARE_PROVIDER_SITE_OTHER): Payer: Medicare Other | Admitting: Internal Medicine

## 2015-10-25 ENCOUNTER — Encounter: Payer: Self-pay | Admitting: Internal Medicine

## 2015-10-25 VITALS — BP 140/74 | HR 68 | Temp 97.8°F | Resp 16 | Ht 63.0 in | Wt 130.0 lb

## 2015-10-25 DIAGNOSIS — Z79899 Other long term (current) drug therapy: Secondary | ICD-10-CM

## 2015-10-25 DIAGNOSIS — E039 Hypothyroidism, unspecified: Secondary | ICD-10-CM

## 2015-10-25 DIAGNOSIS — R05 Cough: Secondary | ICD-10-CM

## 2015-10-25 DIAGNOSIS — I10 Essential (primary) hypertension: Secondary | ICD-10-CM | POA: Diagnosis not present

## 2015-10-25 DIAGNOSIS — E559 Vitamin D deficiency, unspecified: Secondary | ICD-10-CM

## 2015-10-25 DIAGNOSIS — E1121 Type 2 diabetes mellitus with diabetic nephropathy: Secondary | ICD-10-CM

## 2015-10-25 DIAGNOSIS — E782 Mixed hyperlipidemia: Secondary | ICD-10-CM | POA: Diagnosis not present

## 2015-10-25 DIAGNOSIS — K5900 Constipation, unspecified: Secondary | ICD-10-CM | POA: Diagnosis not present

## 2015-10-25 DIAGNOSIS — R059 Cough, unspecified: Secondary | ICD-10-CM

## 2015-10-25 DIAGNOSIS — R350 Frequency of micturition: Secondary | ICD-10-CM

## 2015-10-25 LAB — BASIC METABOLIC PANEL WITH GFR
BUN: 17 mg/dL (ref 7–25)
CHLORIDE: 103 mmol/L (ref 98–110)
CO2: 26 mmol/L (ref 20–31)
CREATININE: 0.83 mg/dL (ref 0.60–0.88)
Calcium: 9.3 mg/dL (ref 8.6–10.4)
GFR, Est African American: 71 mL/min (ref >=60)
GFR, Est Non African American: 62 mL/min (ref >=60)
Glucose, Bld: 271 mg/dL — ABNORMAL HIGH (ref 65–99)
Potassium: 5.2 mmol/L (ref 3.5–5.3)
SODIUM: 138 mmol/L (ref 135–146)

## 2015-10-25 LAB — CBC WITH DIFFERENTIAL/PLATELET
BASOS PCT: 0 %
Basophils Absolute: 0 cells/uL (ref 0–200)
EOS PCT: 2 %
Eosinophils Absolute: 112 cells/uL (ref 15–500)
HCT: 36.2 % (ref 35.0–45.0)
Hemoglobin: 11.9 g/dL (ref 11.7–15.5)
Lymphocytes Relative: 53 %
Lymphs Abs: 2968 cells/uL (ref 850–3900)
MCH: 32.8 pg (ref 27.0–33.0)
MCHC: 32.9 g/dL (ref 32.0–36.0)
MCV: 99.7 fL (ref 80.0–100.0)
MONOS PCT: 9 %
MPV: 10.2 fL (ref 7.5–12.5)
Monocytes Absolute: 504 cells/uL (ref 200–950)
NEUTROS ABS: 2016 {cells}/uL (ref 1500–7800)
Neutrophils Relative %: 36 %
PLATELETS: 166 10*3/uL (ref 140–400)
RBC: 3.63 MIL/uL — ABNORMAL LOW (ref 3.80–5.10)
RDW: 13.2 % (ref 11.0–15.0)
WBC: 5.6 10*3/uL (ref 3.8–10.8)

## 2015-10-25 LAB — HEPATIC FUNCTION PANEL
ALT: 30 U/L — ABNORMAL HIGH (ref 6–29)
AST: 44 U/L — AB (ref 10–35)
Albumin: 3.1 g/dL — ABNORMAL LOW (ref 3.6–5.1)
Alkaline Phosphatase: 58 U/L (ref 33–130)
BILIRUBIN DIRECT: 0.1 mg/dL (ref <=0.2)
BILIRUBIN TOTAL: 0.5 mg/dL (ref 0.2–1.2)
Indirect Bilirubin: 0.4 mg/dL (ref 0.2–1.2)
Total Protein: 7.3 g/dL (ref 6.1–8.1)

## 2015-10-25 MED ORDER — PREDNISONE 20 MG PO TABS: ORAL_TABLET | ORAL | 0 refills | Status: DC

## 2015-10-25 MED ORDER — BENZONATATE 100 MG PO CAPS: 100.0000 mg | ORAL_CAPSULE | Freq: Four times a day (QID) | ORAL | 1 refills | Status: DC | PRN

## 2015-10-25 NOTE — Progress Notes (Signed)
Patient ID: [name], female   DOB: [birth year], 82 y.o.   MRN: [id number]  Assessment and Plan:  Hypertension:  -Continue medication,  -monitor blood pressure at home.  -Continue DASH diet.   -Reminder to go to the ER if any CP, SOB, nausea, dizziness, severe HA, changes vision/speech, left arm numbness and tingling, and jaw pain.  Cholesterol: -not a candidate for  statin -Continue diet and exercise.  -Check cholesterol.   Diabetes: -not currently on metformin -if A1C continues to increase need to consider using at least 1 medication -Continue diet and exercise.  -Check A1C  Vitamin D Def: -continue medications.   Cough -possible aspiration -will get cxr -prednisone -zyrtec  Hypothyroidism -TSH -levothyroxine  Constipation -miralax twice weekly   Continue diet and meds as discussed. Further disposition pending results of labs.  HPI 82 y.o. female  presents for 3 month follow up with hypertension, hyperlipidemia, prediabetes and vitamin D.   Her blood pressure has been controlled at home, today their BP is BP: 140/74.   She does not workout. She denies chest pain, shortness of breath, dizziness.   She is not on cholesterol medication and denies myalgias. Her cholesterol is not at goal. The cholesterol last visit was:   Lab Results  Component Value Date   CHOL 203 (H) 12/20/2014   HDL 64 12/20/2014   LDLCALC 114 12/20/2014   TRIG 124 12/20/2014   CHOLHDL 3.2 12/20/2014     She has been working on diet and exercise for prediabetes, and denies foot ulcerations, hyperglycemia, hypoglycemia , increased appetite, nausea, paresthesia of the feet, polydipsia, polyuria, visual disturbances, vomiting and weight loss. Last A1C in the office was:  Lab Results  Component Value Date   HGBA1C 8.0 (H) 12/20/2014  Her daughter has been having her on a strict diet.  She does not want to take metformin because it was too hard for her to swallow it.    Patient is on  Vitamin D supplement.  Lab Results  Component Value Date   VD25OH 45 09/15/2014     Patient's daughter reports that she has been having a dry cough for the last week.  She has been having some runny nose and also some dripping in her eyes.  She reports that she gets very upset when she coughs and then starts crying.  She does have some choking on food as well. She is not interrupted with sleep.  She does struggle with some trouble with swallowing her food.       Current Medications:  Current Outpatient Prescriptions on File Prior to Visit  Medication Sig Dispense Refill  . aspirin EC 81 MG tablet Take 1 tablet (81 mg total) by mouth daily. 150 tablet 2  . glucose blood (FREESTYLE TEST STRIPS) test strip Use as instructed 100 each 12  . Lancets (FREESTYLE) lancets Use as instructed 100 each 12  . levothyroxine (SYNTHROID, LEVOTHROID) 50 MCG tablet Take 1 tablet by mouth  daily 90 tablet 1  . ranitidine (ZANTAC) 300 MG tablet   0  . traMADol (ULTRAM) 50 MG tablet Take 1 tablet (50 mg total) by mouth every 12 (twelve) hours as needed for moderate pain or severe pain. 180 tablet 0   No current facility-administered medications on file prior to visit.     Medical History:  Past Medical History:  Diagnosis Date  . Allergy   . Arthritis   . Diabetes mellitus without complication (HCC)   . DJD (degenerative joint disease)   .  Glaucoma   . Hepatitis C   . Hyperlipidemia   . Hypertension   . Thyroid disease   . Vitamin D deficiency     Allergies: No Known Allergies   Review of Systems:  Review of Systems  Constitutional: Negative for chills, fever and malaise/fatigue.  HENT: Negative for congestion, ear pain and sore throat.   Eyes: Negative.   Respiratory: Negative for cough, shortness of breath and wheezing.   Cardiovascular: Negative for chest pain, palpitations and leg swelling.  Gastrointestinal: Negative for abdominal pain, blood in stool, constipation, diarrhea, heartburn  and melena.  Genitourinary: Negative.   Skin: Negative.   Neurological: Negative for dizziness, sensory change, loss of consciousness and headaches.  Psychiatric/Behavioral: Negative for depression. The patient is not nervous/anxious and does not have insomnia.     Family history- Review and unchanged  Social history- Review and unchanged  Physical Exam: BP 140/74   Pulse 68   Temp 97.8 F (36.6 C) (Temporal)   Resp 16   Ht 5\' 3"  (1.6 m)   Wt 130 lb (59 kg)   SpO2 95%   BMI 23.03 kg/m  Wt Readings from Last 3 Encounters:  10/25/15 130 lb (59 kg)  07/25/15 133 lb (60.3 kg)  04/21/15 135 lb 6.4 oz (61.4 kg)    General Appearance: Well nourished well developed, in no apparent distress. Eyes: PERRLA, EOMs, conjunctiva no swelling or erythema ENT/Mouth: Ear canals normal without obstruction, swelling, erythma, discharge.  TMs normal bilaterally.  Oropharynx moist, clear, without exudate, or postoropharyngeal swelling. Neck: Supple, thyroid normal,no cervical adenopathy  Respiratory: Respiratory effort normal, Breath sounds clear A&P without rhonchi, wheeze, or rale.  No retractions, no accessory usage. Cardio: RRR with no MRGs. Brisk peripheral pulses without edema.  Abdomen: Soft, + BS,  Non tender, no guarding, rebound, hernias, masses. Musculoskeletal: Full ROM, 5/5 strength, Normal gait Skin: Warm, dry without rashes, lesions, ecchymosis.  Neuro: Awake and oriented X 3, Cranial nerves intact. Normal muscle tone, no cerebellar symptoms. Psych: Normal affect, Insight and Judgment appropriate.    Terri Piedraourtney Forcucci, PA-C 2:38 PM [facility]

## 2015-10-26 ENCOUNTER — Encounter: Payer: Self-pay | Admitting: Physician Assistant

## 2015-10-26 ENCOUNTER — Other Ambulatory Visit: Payer: Self-pay | Admitting: Internal Medicine

## 2015-10-26 ENCOUNTER — Ambulatory Visit: Payer: Self-pay | Admitting: Physician Assistant

## 2015-10-26 LAB — URINALYSIS, ROUTINE W REFLEX MICROSCOPIC
Bilirubin Urine: NEGATIVE
Hgb urine dipstick: NEGATIVE
KETONES UR: NEGATIVE
NITRITE: NEGATIVE
PH: 6 (ref 5.0–8.0)
Protein, ur: NEGATIVE
SPECIFIC GRAVITY, URINE: 1.014 (ref 1.001–1.035)

## 2015-10-26 LAB — URINALYSIS, MICROSCOPIC ONLY
BACTERIA UA: NONE SEEN [HPF]
CASTS: NONE SEEN [LPF]
CRYSTALS: NONE SEEN [HPF]
RBC / HPF: NONE SEEN RBC/HPF (ref <=2)
Squamous Epithelial / LPF: NONE SEEN [HPF] (ref <=5)
Yeast: NONE SEEN [HPF]

## 2015-10-26 LAB — HEMOGLOBIN A1C
HEMOGLOBIN A1C: 6.8 % — AB (ref <5.7)
MEAN PLASMA GLUCOSE: 148 mg/dL

## 2015-10-26 LAB — TSH: TSH: 3.93 m[IU]/L

## 2015-10-26 NOTE — Progress Notes (Signed)
Patient requested pharmacy change.

## 2015-10-27 ENCOUNTER — Ambulatory Visit (HOSPITAL_COMMUNITY)
Admission: RE | Admit: 2015-10-27 | Discharge: 2015-10-27 | Disposition: A | Discharge status: Home / Self Care | Payer: Medicare Other | Source: Ambulatory Visit | Attending: Internal Medicine | Admitting: Internal Medicine

## 2015-10-27 DIAGNOSIS — R05 Cough: Secondary | ICD-10-CM | POA: Diagnosis not present

## 2015-10-27 DIAGNOSIS — I700 Atherosclerosis of aorta: Secondary | ICD-10-CM | POA: Diagnosis not present

## 2015-10-27 DIAGNOSIS — R059 Cough, unspecified: Secondary | ICD-10-CM

## 2015-10-27 LAB — URINE CULTURE: Organism ID, Bacteria: NO GROWTH

## 2015-11-08 ENCOUNTER — Other Ambulatory Visit: Payer: Self-pay | Admitting: Internal Medicine

## 2015-11-08 MED ORDER — CLOTRIMAZOLE-BETAMETHASONE 1-0.05 % EX CREA: TOPICAL_CREAM | CUTANEOUS | 1 refills | Status: DC

## 2015-11-08 NOTE — Progress Notes (Signed)
Daughter reports that the patient has a rash under her breast.  We will send in lotrisone.

## 2015-12-19 ENCOUNTER — Other Ambulatory Visit: Payer: Self-pay | Admitting: Physician Assistant

## 2015-12-19 MED ORDER — SILVER SULFADIAZINE 1 % EX CREA: 1.0000 "application " | TOPICAL_CREAM | Freq: Every day | CUTANEOUS | 2 refills | Status: DC

## 2016-01-13 ENCOUNTER — Other Ambulatory Visit: Payer: Self-pay

## 2016-01-13 MED ORDER — CLOTRIMAZOLE-BETAMETHASONE 1-0.05 % EX CREA: TOPICAL_CREAM | CUTANEOUS | 1 refills | Status: DC

## 2016-01-20 ENCOUNTER — Other Ambulatory Visit: Payer: Self-pay | Admitting: Internal Medicine

## 2016-01-25 ENCOUNTER — Ambulatory Visit: Payer: Self-pay | Admitting: Internal Medicine

## 2016-01-26 NOTE — Progress Notes (Signed)
NO SHOW

## 2016-02-02 ENCOUNTER — Encounter: Payer: Self-pay | Admitting: Physician Assistant

## 2016-02-02 ENCOUNTER — Ambulatory Visit (INDEPENDENT_AMBULATORY_CARE_PROVIDER_SITE_OTHER): Payer: Medicare Other | Admitting: Physician Assistant

## 2016-02-02 ENCOUNTER — Other Ambulatory Visit: Payer: Self-pay

## 2016-02-02 ENCOUNTER — Ambulatory Visit (HOSPITAL_COMMUNITY)
Admission: RE | Admit: 2016-02-02 | Discharge: 2016-02-02 | Disposition: A | Discharge status: Home / Self Care | Payer: Medicare Other | Source: Ambulatory Visit | Attending: Physician Assistant | Admitting: Physician Assistant

## 2016-02-02 VITALS — BP 118/70 | HR 91 | Temp 97.5°F | Resp 14 | Ht 63.0 in | Wt 130.6 lb

## 2016-02-02 DIAGNOSIS — R350 Frequency of micturition: Secondary | ICD-10-CM | POA: Diagnosis not present

## 2016-02-02 DIAGNOSIS — J984 Other disorders of lung: Secondary | ICD-10-CM | POA: Insufficient documentation

## 2016-02-02 DIAGNOSIS — R4182 Altered mental status, unspecified: Secondary | ICD-10-CM | POA: Diagnosis not present

## 2016-02-02 DIAGNOSIS — R0989 Other specified symptoms and signs involving the circulatory and respiratory systems: Secondary | ICD-10-CM | POA: Insufficient documentation

## 2016-02-02 DIAGNOSIS — J40 Bronchitis, not specified as acute or chronic: Secondary | ICD-10-CM | POA: Diagnosis not present

## 2016-02-02 LAB — CBC WITH DIFFERENTIAL/PLATELET
BASOS ABS: 0 {cells}/uL (ref 0–200)
Basophils Relative: 0 %
EOS ABS: 232 {cells}/uL (ref 15–500)
Eosinophils Relative: 4 %
HEMATOCRIT: 39.8 % (ref 35.0–45.0)
HEMOGLOBIN: 12.9 g/dL (ref 11.7–15.5)
LYMPHS ABS: 3016 {cells}/uL (ref 850–3900)
Lymphocytes Relative: 52 %
MCH: 33 pg (ref 27.0–33.0)
MCHC: 32.4 g/dL (ref 32.0–36.0)
MCV: 101.8 fL — AB (ref 80.0–100.0)
MONO ABS: 522 {cells}/uL (ref 200–950)
MPV: 10.3 fL (ref 7.5–12.5)
Monocytes Relative: 9 %
NEUTROS ABS: 2030 {cells}/uL (ref 1500–7800)
Neutrophils Relative %: 35 %
Platelets: 173 10*3/uL (ref 140–400)
RBC: 3.91 MIL/uL (ref 3.80–5.10)
RDW: 13.9 % (ref 11.0–15.0)
WBC: 5.8 10*3/uL (ref 3.8–10.8)

## 2016-02-02 LAB — TSH: TSH: 3.15 mIU/L

## 2016-02-02 MED ORDER — AZITHROMYCIN 250 MG PO TABS: ORAL_TABLET | ORAL | 1 refills | Status: AC

## 2016-02-02 MED ORDER — SILVER SULFADIAZINE 1 % EX CREA: 1.0000 "application " | TOPICAL_CREAM | Freq: Every day | CUTANEOUS | 2 refills | Status: DC

## 2016-02-02 NOTE — Patient Instructions (Signed)
Any chest pain, shortness of breath, worsening confusion go to ER   Confusion Introduction Confusion is the inability to think with your usual speed or clarity. Confusion may come on quickly or slowly over time. How quickly the confusion comes on depends on the cause. Confusion can be due to any number of causes. What are the causes?  Concussion, head injury, or head trauma.  Seizures.  Stroke.  Fever.  Brain tumor.  Age related decreased brain function (dementia).  Heightened emotional states like rage or terror.  Mental illness in which the person loses the ability to determine what is real and what is not (hallucinations).  Infections such as a urinary tract infection (UTI).  Toxic effects from alcohol, drugs, or prescription medicines.  Dehydration and an imbalance of salts in the body (electrolytes).  Lack of sleep.  Low blood sugar (diabetes).  Low levels of oxygen from conditions such as chronic lung disorders.  Drug interactions or other medicine side effects.  Nutritional deficiencies, especially niacin, thiamine, vitamin C, or vitamin B.  Sudden drop in body temperature (hypothermia).  Change in routine, such as when traveling or hospitalized. What are the signs or symptoms? People often describe their thinking as cloudy or unclear when they are confused. Confusion can also include feeling disoriented. That means you are unaware of where or who you are. You may also not know what the date or time is. If confused, you may also have difficulty paying attention, remembering, and making decisions. Some people also act aggressively when they are confused. How is this diagnosed? The medical evaluation of confusion may include:  Blood and urine tests.  X-rays.  Brain and nervous system tests.  Analyzing your brain waves (electroencephalogram or EEG).  Magnetic resonance imaging (MRI) of your head.  Computed tomography (CT) scan of your head.  Mental  status tests in which your health care provider may ask many questions. Some of these questions may seem silly or strange, but they are a very important test to help diagnose and treat confusion. How is this treated? An admission to the hospital may not be needed, but a person with confusion should not be left alone. Stay with a family member or friend until the confusion clears. Avoid alcohol, pain relievers, or sedative drugs until you have fully recovered. Do not drive until directed by your health care provider. Follow these instructions at home: What family and friends can do:  To find out if someone is confused, ask the person to state his or her name, age, and the date. If the person is unsure or answers incorrectly, he or she is confused.  Always introduce yourself, no matter how well the person knows you.  Often remind the person of his or her location.  Place a calendar and clock near the confused person.  Help the person with his or her medicines. You may want to use a pill box, an alarm as a reminder, or give the person each dose as prescribed.  Talk about current events and plans for the day.  Try to keep the environment calm, quiet, and peaceful.  Make sure the person keeps follow-up visits with his or her health care provider. How is this prevented? Ways to prevent confusion:  Avoid alcohol.  Eat a balanced diet.  Get enough sleep.  Take medicine only as directed by your health care provider.  Do not become isolated. Spend time with other people and make plans for your days.  Keep careful watch on  your blood sugar levels if you are diabetic. Get help right away if:  You develop severe headaches, repeated vomiting, seizures, blackouts, or slurred speech.  There is increasing confusion, weakness, numbness, restlessness, or personality changes.  You develop a loss of balance, have marked dizziness, feel uncoordinated, or fall.  You have delusions,  hallucinations, or develop severe anxiety.  Your family members think you need to be rechecked. This information is not intended to replace advice given to you by your health care provider. Make sure you discuss any questions you have with your health care provider. Document Released: 03/08/2004 Document Revised: 08/19/2015 Document Reviewed: 03/06/2013  2017 Elsevier

## 2016-02-02 NOTE — Progress Notes (Signed)
Subjective:    Patient ID: [name], female    DOB: 03-Jun-1922, 82 y.o.   MRN: [id number]  HPI 82 y.o. [name] presents with her daughters for possible cold and AMS x several days. She has been having watery eyes, cold symptoms, was disoriented when she woke up and did not want to walk. Denies any falls. She has been sleeping a lot lately and is falling asleep in the chair in the office, decreased appetite. History comes from her daughters.   Blood pressure 118/70, pulse 91, temperature 97.5 F (36.4 C), resp. rate 14, height 5\' 3"  (1.6 m), weight 130 lb 9.6 oz (59.2 kg), SpO2 96 %.   Medications Current Outpatient Prescriptions on File Prior to Visit  Medication Sig  . benzonatate (TESSALON PERLES) 100 MG capsule Take 1 capsule (100 mg total) by mouth every 6 (six) hours as needed for cough.  . clotrimazole-betamethasone (LOTRISONE) cream Apply to affected area 2 times daily  . glucose blood (FREESTYLE TEST STRIPS) test strip Use as instructed  . Lancets (FREESTYLE) lancets Use as instructed  . levothyroxine (SYNTHROID, LEVOTHROID) 50 MCG tablet TAKE 1 TABLET BY MOUTH  DAILY  . ranitidine (ZANTAC) 300 MG tablet   . traMADol (ULTRAM) 50 MG tablet Take 1 tablet (50 mg total) by mouth every 12 (twelve) hours as needed for moderate pain or severe pain.   No current facility-administered medications on file prior to visit.     Problem list She has Hepatitis C; Normocytic anemia; T2_NIDDM /CKD3; Mixed hyperlipidemia; Essential hypertension; Seizure (HCC); Vitamin D deficiency; Encounter for long-term (current) use of medications; Hypothyroidism; Knee pain, bilateral; and Encounter for Medicare annual wellness exam on her problem list.  Review of Systems  Constitutional: Positive for appetite change and fatigue. Negative for activity change, chills, diaphoresis, fever and unexpected weight change.  HENT: Positive for hearing loss, postnasal drip and rhinorrhea. Negative for congestion,  dental problem, drooling, ear discharge, ear pain, facial swelling, sneezing and trouble swallowing.   Eyes: Positive for discharge (clear watery). Negative for photophobia, pain, redness, itching and visual disturbance.  Respiratory: Positive for cough. Negative for apnea, choking, chest tightness, shortness of breath and wheezing.   Cardiovascular: Negative for chest pain, palpitations and leg swelling.  Gastrointestinal: Negative.  Negative for constipation and diarrhea.  Genitourinary: Negative.   Skin: Negative.   Neurological: Positive for weakness. Negative for tremors, seizures, syncope, facial asymmetry and speech difficulty.       Objective:   Physical Exam  Constitutional: She appears well-developed.  HENT:  Head: Normocephalic and atraumatic.  Right Ear: External ear normal.  Left Ear: External ear normal.  Eyes: Conjunctivae are normal. Pupils are equal, round, and reactive to light.  Neck: Normal range of motion. Neck supple.  Cardiovascular: Regular rhythm.   Pulmonary/Chest: Effort normal. She has rales (bilateral lower lobes ).  Abdominal: Soft. There is no tenderness.  Musculoskeletal:  Walks unsteady/slow with walker  Neurological: She is alert. No cranial nerve deficit.  Skin: Skin is warm and dry. No rash noted.       Assessment & Plan:  1. Altered mental status, unspecified altered mental status type Will put on zpak, no obvious infection, no red flags on exam Any worsening symptoms/issues go to ER - CBC with Differential/Platelet - BASIC METABOLIC PANEL WITH GFR - Hepatic function panel - TSH - Troponin I - CK total and CKMB (cardiac)[facility]  2. Abnormal chest sounds ? From scarring versus new fluid/infection - DG  Chest 2 View; Future  3. Urinary frequency - Urinalysis, Routine w reflex microscopic - Urine culture  Future Appointments Date Time Provider Department Center  04/30/2016 2:00 PM Quentin MullingAmanda Dillard Pascal, PA-C [id number] None

## 2016-02-03 LAB — CK TOTAL AND CKMB (NOT AT ARMC)
CK, MB: <0.7 ng/mL (ref 0.0–5.0)
Total CK: 18 U/L (ref 7–177)

## 2016-02-03 LAB — URINALYSIS, ROUTINE W REFLEX MICROSCOPIC

## 2016-02-03 LAB — BASIC METABOLIC PANEL WITH GFR
BUN: 14 mg/dL (ref 7–25)
CALCIUM: 9.7 mg/dL (ref 8.6–10.4)
CHLORIDE: 103 mmol/L (ref 98–110)
CO2: 25 mmol/L (ref 20–31)
Creat: 1.09 mg/dL — ABNORMAL HIGH (ref 0.60–0.88)
GFR, EST NON AFRICAN AMERICAN: 44 mL/min — AB (ref >=60)
GFR, Est African American: 51 mL/min — ABNORMAL LOW (ref >=60)
GLUCOSE: 274 mg/dL — AB (ref 65–99)
Potassium: 5.2 mmol/L (ref 3.5–5.3)
Sodium: 143 mmol/L (ref 135–146)

## 2016-02-03 LAB — TROPONIN I: Troponin I: 0.01 ng/mL (ref <=0.05)

## 2016-02-03 LAB — HEPATIC FUNCTION PANEL
ALK PHOS: 62 U/L (ref 33–130)
ALT: 28 U/L (ref 6–29)
AST: 49 U/L — ABNORMAL HIGH (ref 10–35)
Albumin: 3.3 g/dL — ABNORMAL LOW (ref 3.6–5.1)
BILIRUBIN INDIRECT: 0.5 mg/dL (ref 0.2–1.2)
Bilirubin, Direct: 0.2 mg/dL (ref <=0.2)
TOTAL PROTEIN: 7.6 g/dL (ref 6.1–8.1)
Total Bilirubin: 0.7 mg/dL (ref 0.2–1.2)

## 2016-02-03 NOTE — Progress Notes (Signed)
Pt's caregiver was made aware of CXR & voiced understanding Pt's updated number is [phone]

## 2016-02-08 ENCOUNTER — Other Ambulatory Visit: Payer: Self-pay | Admitting: Physician Assistant

## 2016-02-08 DIAGNOSIS — R350 Frequency of micturition: Secondary | ICD-10-CM | POA: Diagnosis not present

## 2016-02-08 NOTE — Progress Notes (Signed)
LVM for pt to return office call for LAB results.

## 2016-02-09 LAB — URINALYSIS, ROUTINE W REFLEX MICROSCOPIC
BILIRUBIN URINE: NEGATIVE
GLUCOSE, UA: NEGATIVE
HGB URINE DIPSTICK: NEGATIVE
KETONES UR: NEGATIVE
Nitrite: NEGATIVE
PROTEIN: NEGATIVE
Specific Gravity, Urine: 1.009 (ref 1.001–1.035)
pH: 6.5 (ref 5.0–8.0)

## 2016-02-09 LAB — URINE CULTURE

## 2016-02-09 LAB — URINALYSIS, MICROSCOPIC ONLY
Bacteria, UA: NONE SEEN [HPF]
Casts: NONE SEEN [LPF]
Crystals: NONE SEEN [HPF]
RBC / HPF: NONE SEEN RBC/HPF (ref <=2)
Squamous Epithelial / LPF: NONE SEEN [HPF] (ref <=5)
WBC UA: NONE SEEN WBC/HPF (ref <=5)
YEAST: NONE SEEN [HPF]

## 2016-02-22 ENCOUNTER — Telehealth: Payer: Self-pay | Admitting: *Deleted

## 2016-02-22 NOTE — Telephone Encounter (Signed)
Patient's daughter, [name], called stating her mother is "talking out of her head" and requesting sedative or anti-anxiety Rx.  Patient was seen by Quentin MullingAmanda Collier, PA-C 02/02/16 with Dx of "altered mental status."  Patient was unable to urinate at that ov and was advised to return a urine to the office to check culture. We have not received that.  I advised her daughter, [name], that she needs to return a urine today so that we can r/o UTI, which may be contributing to her "mental altered state."  I advised her, per Courtney's orders, that if the UC is negative then we will call in Xanax 0.25 mg to help with patient's anxiety.  [name] expressed understanding.

## 2016-02-23 ENCOUNTER — Encounter: Payer: Self-pay | Admitting: Internal Medicine

## 2016-02-23 ENCOUNTER — Ambulatory Visit (INDEPENDENT_AMBULATORY_CARE_PROVIDER_SITE_OTHER): Payer: Medicare Other | Admitting: Internal Medicine

## 2016-02-23 VITALS — BP 138/74 | HR 76 | Temp 97.8°F | Resp 14 | Ht 63.0 in | Wt 124.0 lb

## 2016-02-23 DIAGNOSIS — R300 Dysuria: Secondary | ICD-10-CM | POA: Diagnosis not present

## 2016-02-23 MED ORDER — ALPRAZOLAM 0.25 MG PO TABS: 0.2500 mg | ORAL_TABLET | Freq: Three times a day (TID) | ORAL | 0 refills | Status: AC | PRN

## 2016-02-23 NOTE — Patient Instructions (Signed)
Please give Ms. [name] 1 tablet of the [employer] brand allergy medication per day.  Please stop the benadryl as I think that this is causing her to be more confused and also can be contributing to her balance issues.  Please use the 1/2 tablet of the xanax only if Ms. [name] starts to become really upset or agitated.  Please make sure somebody is with her when she is walking around.  Please make sure you are walking around with a walker.

## 2016-02-23 NOTE — Progress Notes (Signed)
Assessment and Plan:   1. Dysuria -ua -urine culture -will send in abx tomorrow if UA appears to be positive  2.  Anxiety  -will try 1/2 tablet of 0.25 mg xanax prn -stop if hallucinations  3.  Gait difficulty -may be due to use of benadryl -stop benadryl -try daily antihistamine like zyrtec prn for allergies.      HPI 83 y.o.female presents for increasing fatigue and confusion which has been going on for the past month.  Patient is here with her daughter today.  She has recently found out that one of her daughters has current terminal cancer and she was not aware of this.  We were contacted by her daughter asking for a mild sedative to help with her emotional control during this time.  She was seen last month for altered mental status and at that time they wanted to get a UA and culture which patient was unable to give.  We declined prescribing xanax until we were able to check the urine.  She does have a history of seizure and also worsening mentation over the last year and a half.    Past Medical History:  Diagnosis Date  . Allergy   . Arthritis   . Diabetes mellitus without complication (HCC)   . DJD (degenerative joint disease)   . Glaucoma   . Hepatitis C   . Hyperlipidemia   . Hypertension   . Thyroid disease   . Vitamin D deficiency      No Known Allergies    Current Outpatient Prescriptions on File Prior to Visit  Medication Sig Dispense Refill  . levothyroxine (SYNTHROID, LEVOTHROID) 50 MCG tablet TAKE 1 TABLET BY MOUTH  DAILY 90 tablet 1   No current facility-administered medications on file prior to visit.     ROS: all negative except above.   Physical Exam: Filed Weights   02/23/16 1428  Weight: 124 lb (56.2 kg)   BP 138/74   Pulse 76   Temp 97.8 F (36.6 C) (Temporal)   Resp 14   Ht 5\' 3"  (1.6 m)   Wt 124 lb (56.2 kg)   BMI 21.97 kg/m  General Appearance: Well developed well nourished, non-toxic appearing in no apparent distress. Eyes:  PERRLA, EOMs, conjunctiva w/ no swelling or erythema or discharge Sinuses: No Frontal/maxillary tenderness ENT/Mouth: Ear canals clear without swelling or erythema.  TM's normal bilaterally with no retractions, bulging, or loss of landmarks.   Neck: Supple, thyroid normal, no notable JVD  Respiratory: Respiratory effort normal, Clear breath sounds anteriorly and posteriorly bilaterally without rales, rhonchi, wheezing or stridor. No retractions or accessory muscle usage. Cardio: RRR with no MRGs.   Abdomen: Soft, + BS.  Non tender, no guarding, rebound, hernias, masses.  Musculoskeletal: Full ROM, 5/5 strength, normal gait.  Skin: Warm, dry without rashes  Neuro: Awake and oriented X 3, Cranial nerves intact. Normal muscle tone, no cerebellar symptoms. Sensation intact.  Psych: normal affect, Insight and Judgment appropriate.     Terri Piedraourtney Forcucci, PA-C 2:36 PM [facility]

## 2016-02-24 DIAGNOSIS — R300 Dysuria: Secondary | ICD-10-CM | POA: Diagnosis not present

## 2016-02-24 LAB — URINALYSIS, MICROSCOPIC ONLY
Bacteria, UA: NONE SEEN [HPF]
CASTS: NONE SEEN [LPF]
Crystals: NONE SEEN [HPF]
Squamous Epithelial / LPF: NONE SEEN [HPF] (ref <=5)
YEAST: NONE SEEN [HPF]

## 2016-02-24 LAB — URINALYSIS, ROUTINE W REFLEX MICROSCOPIC
Bilirubin Urine: NEGATIVE
HGB URINE DIPSTICK: NEGATIVE
KETONES UR: NEGATIVE
NITRITE: NEGATIVE
PH: 6 (ref 5.0–8.0)
Specific Gravity, Urine: 1.014 (ref 1.001–1.035)

## 2016-02-24 NOTE — Addendum Note (Signed)
Addended by: Naser Schuld A on: 02/24/2016 09:30 AM   Modules accepted: Orders

## 2016-02-24 NOTE — Addendum Note (Signed)
Addended by: Bethann BerkshireSCHUMAKER, Charon Smedberg S on: 02/24/2016 09:32 AM   Modules accepted: Orders

## 2016-02-25 LAB — URINE CULTURE: ORGANISM ID, BACTERIA: NO GROWTH

## 2016-03-06 ENCOUNTER — Encounter: Payer: Self-pay | Admitting: *Deleted

## 2016-04-17 ENCOUNTER — Other Ambulatory Visit: Payer: Self-pay | Admitting: *Deleted

## 2016-04-17 MED ORDER — TRAMADOL HCL 50 MG PO TABS: 50.0000 mg | ORAL_TABLET | Freq: Two times a day (BID) | ORAL | 0 refills | Status: DC

## 2016-04-30 ENCOUNTER — Ambulatory Visit (INDEPENDENT_AMBULATORY_CARE_PROVIDER_SITE_OTHER): Payer: Medicare Other | Admitting: Physician Assistant

## 2016-04-30 ENCOUNTER — Encounter: Payer: Self-pay | Admitting: Physician Assistant

## 2016-04-30 VITALS — BP 130/60 | HR 70 | Temp 97.2°F | Resp 14 | Ht 62.0 in | Wt 131.4 lb

## 2016-04-30 DIAGNOSIS — M25562 Pain in left knee: Secondary | ICD-10-CM

## 2016-04-30 DIAGNOSIS — E039 Hypothyroidism, unspecified: Secondary | ICD-10-CM | POA: Diagnosis not present

## 2016-04-30 DIAGNOSIS — I10 Essential (primary) hypertension: Secondary | ICD-10-CM | POA: Diagnosis not present

## 2016-04-30 DIAGNOSIS — Z23 Encounter for immunization: Secondary | ICD-10-CM | POA: Diagnosis not present

## 2016-04-30 DIAGNOSIS — R569 Unspecified convulsions: Secondary | ICD-10-CM

## 2016-04-30 DIAGNOSIS — G8929 Other chronic pain: Secondary | ICD-10-CM

## 2016-04-30 DIAGNOSIS — R350 Frequency of micturition: Secondary | ICD-10-CM

## 2016-04-30 DIAGNOSIS — E782 Mixed hyperlipidemia: Secondary | ICD-10-CM

## 2016-04-30 DIAGNOSIS — B1920 Unspecified viral hepatitis C without hepatic coma: Secondary | ICD-10-CM | POA: Diagnosis not present

## 2016-04-30 DIAGNOSIS — Z0001 Encounter for general adult medical examination with abnormal findings: Secondary | ICD-10-CM | POA: Diagnosis not present

## 2016-04-30 DIAGNOSIS — E1121 Type 2 diabetes mellitus with diabetic nephropathy: Secondary | ICD-10-CM | POA: Diagnosis not present

## 2016-04-30 DIAGNOSIS — M25561 Pain in right knee: Secondary | ICD-10-CM

## 2016-04-30 DIAGNOSIS — R6889 Other general symptoms and signs: Secondary | ICD-10-CM | POA: Diagnosis not present

## 2016-04-30 DIAGNOSIS — D649 Anemia, unspecified: Secondary | ICD-10-CM | POA: Diagnosis not present

## 2016-04-30 DIAGNOSIS — Z79899 Other long term (current) drug therapy: Secondary | ICD-10-CM

## 2016-04-30 DIAGNOSIS — E559 Vitamin D deficiency, unspecified: Secondary | ICD-10-CM

## 2016-04-30 LAB — BASIC METABOLIC PANEL WITH GFR
BUN: 15 mg/dL (ref 7–25)
CO2: 26 mmol/L (ref 20–31)
CREATININE: 0.95 mg/dL — AB (ref 0.60–0.88)
Calcium: 9 mg/dL (ref 8.6–10.4)
Chloride: 101 mmol/L (ref 98–110)
GFR, Est African American: 60 mL/min (ref >=60)
GFR, Est Non African American: 52 mL/min — ABNORMAL LOW (ref >=60)
Glucose, Bld: 194 mg/dL — ABNORMAL HIGH (ref 65–99)
Potassium: 3.9 mmol/L (ref 3.5–5.3)
Sodium: 137 mmol/L (ref 135–146)

## 2016-04-30 LAB — CBC WITH DIFFERENTIAL/PLATELET
BASOS ABS: 0 {cells}/uL (ref 0–200)
BASOS PCT: 0 %
EOS ABS: 232 {cells}/uL (ref 15–500)
Eosinophils Relative: 4 %
HCT: 36.8 % (ref 35.0–45.0)
Hemoglobin: 12.2 g/dL (ref 11.7–15.5)
LYMPHS PCT: 57 %
Lymphs Abs: 3306 cells/uL (ref 850–3900)
MCH: 32.9 pg (ref 27.0–33.0)
MCHC: 33.2 g/dL (ref 32.0–36.0)
MCV: 99.2 fL (ref 80.0–100.0)
MONOS PCT: 9 %
MPV: 10.3 fL (ref 7.5–12.5)
Monocytes Absolute: 522 cells/uL (ref 200–950)
Neutro Abs: 1740 cells/uL (ref 1500–7800)
Neutrophils Relative %: 30 %
PLATELETS: 171 10*3/uL (ref 140–400)
RBC: 3.71 MIL/uL — ABNORMAL LOW (ref 3.80–5.10)
RDW: 13.9 % (ref 11.0–15.0)
WBC: 5.8 10*3/uL (ref 3.8–10.8)

## 2016-04-30 LAB — TSH: TSH: 4.56 m[IU]/L — AB

## 2016-04-30 LAB — HEPATIC FUNCTION PANEL
ALT: 33 U/L — AB (ref 6–29)
AST: 53 U/L — AB (ref 10–35)
Albumin: 3.1 g/dL — ABNORMAL LOW (ref 3.6–5.1)
Alkaline Phosphatase: 65 U/L (ref 33–130)
BILIRUBIN DIRECT: 0.1 mg/dL (ref <=0.2)
BILIRUBIN INDIRECT: 0.4 mg/dL (ref 0.2–1.2)
Total Bilirubin: 0.5 mg/dL (ref 0.2–1.2)
Total Protein: 6.9 g/dL (ref 6.1–8.1)

## 2016-04-30 NOTE — Progress Notes (Signed)
CPE AND FOLLOW UP  Assessment:   Essential hypertension - continue medications, DASH diet, exercise and monitor at home. Call if greater than 130/80.  - BASIC METABOLIC PANEL WITH GFR - Hepatic function panel  T2_NIDDM /CKD3 Discussed general issues about diabetes pathophysiology and management., Educational material distributed., Suggested low cholesterol diet., Encouraged aerobic exercise., Discussed foot care., Reminded to get yearly retinal exam. - Hemoglobin A1c   Hypothyroidism, unspecified hypothyroidism type Hypothyroidism-check TSH level, continue medications the same, reminded to take on an empty stomach 30-31mins before food.  - TSH   Mixed hyperlipidemia -continue medications, check lipids, decrease fatty foods, increase activity.  - Lipid panel  Normocytic anemia - CBC with Differential/Platelet   Seizure remission   Vitamin D deficiency - Vit D  25 hydroxy (rtn osteoporosis monitoring)  Encounter for long-term (current) use of medications - Magnesium  Hepatitis C virus infection without hepatic coma, unspecified chronicity remission  Urinary frequency - Urinalysis, Routine w reflex microscopic - Urine culture  Knee pain, bilateral - traMADol (ULTRAM) 50 MG tablet; Take 1 tablet (50 mg total) by mouth every 12 (twelve) hours as needed for moderate pain or severe pain.  Dispense: 60 tablet; Refill: 0  Need for prophylactic vaccination against Streptococcus pneumoniae (pneumococcus) -     Pneumococcal conjugate vaccine 13-valent IM   Over 30 minutes of exam, counseling, chart review, and critical decision making was performed Future Appointments Date Time Provider Department Center  05/01/2017 2:00 PM Quentin Mulling, PA-C [facility] None     Subjective:   [name] is a 83 y.o. female who presents for CPE and 3 month follow up on hypertension, diabetes, hyperlipidemia, vitamin D def.   Her blood pressure has been controlled at home, has had  some dizziness  2-3 weeks, has had some runny nose, not on any BP meds, today their BP is BP: 130/60.  She does not workout. She denies chest pain, shortness of breath, dizziness.  She is not on cholesterol medication and denies myalgias. Her cholesterol is not at goal. The cholesterol last visit was:   Lab Results  Component Value Date   CHOL 203 (H) 12/20/2014   HDL 64 12/20/2014   LDLCALC 114 12/20/2014   TRIG 124 12/20/2014   CHOLHDL 3.2 12/20/2014   She has been working on diet and exercise for diabetes with CKD, she is off ARB due to hypotension, she is off the metformin and is diet controlled, sugars have been monitored very closely by her daughter, and denies paresthesia of the feet, polydipsia, polyuria and visual disturbances. Last A1C in the office was:  Lab Results  Component Value Date   HGBA1C 6.8 (H) 10/25/2015   Patient is on Vitamin D supplement. Lab Results  Component Value Date   VD25OH 45 09/15/2014     Patient lives with her daughter, denies any falls in the past year.  She complains of chronic pain in bilateral knees and follows with Dr. Heron Nay, takes tramadol PRN, she takes rarely, had 60 filled in Dec and still has some left. Walks with walker/cane.  She has dementia and her daughter provides the majority of the history.  She has a history of GERD.  She is on thyroid medication. Her medication was not changed last visit.   Lab Results  Component Value Date   TSH 3.15 02/02/2016  .   Medication Review Current Outpatient Prescriptions on File Prior to Visit  Medication Sig Dispense Refill  . ALPRAZolam (XANAX) 0.25 MG tablet Take  1 tablet (0.25 mg total) by mouth 3 (three) times daily as needed for anxiety. 30 tablet 0  . aspirin EC 81 MG tablet Take 81 mg by mouth daily.    Marland Kitchen. levothyroxine (SYNTHROID, LEVOTHROID) 50 MCG tablet TAKE 1 TABLET BY MOUTH  DAILY 90 tablet 1  . traMADol (ULTRAM) 50 MG tablet Take 1 tablet (50 mg total) by mouth 2 (two) times  daily. 180 tablet 0   No current facility-administered medications on file prior to visit.     Current Problems (verified) Patient Active Problem List   Diagnosis Date Noted  . Encounter for Medicare annual wellness exam 09/08/2014  . Knee pain, bilateral 06/14/2014  . Hypothyroidism 02/23/2014  . Encounter for long-term (current) use of medications 08/20/2013  . Vitamin D deficiency 02/19/2013  . Seizure (HCC) 06/13/2012  . Hepatitis C 06/10/2012  . Normocytic anemia 06/10/2012  . T2_NIDDM Marikay Alar/CKD3 06/10/2012  . Mixed hyperlipidemia 06/10/2012  . Essential hypertension 06/10/2012    Screening Tests Immunization History  Administered Date(s) Administered  . Influenza Whole 10/29/2012  . Influenza, High Dose Seasonal PF 11/23/2013, 12/20/2014  . Pneumococcal Polysaccharide-23 07/15/2008    Preventative care:  Last colonoscopy: declines Last mammogram: declines Last pap smear/pelvic exam: declines   DEXA:declines  Prior vaccinations: TD or Tdap: declines  Influenza: 2017 Pneumococcal: 2010 Prevnar 13: declines Shingles/Zostavax: declines.   Names of Other Physician/Practitioners you currently use: 1. [facility]- here for primary care Patient Care Team: Lucky CowboyWilliam McKeown, MD as PCP - General (Internal Medicine) Sallye Lathristopher Groat, MD as Consulting Physician (Optometry) Sharrell KuJeffrey Medoff, MD as Consulting Physician (Gastroenterology) Marcene CorningPeter Dalldorf, MD as Consulting Physician (Orthopedic Surgery) Brooke DareSteven Zacks, MD as Consulting Physician (Gastroenterology)  Allergies No Known Allergies  SURGICAL HISTORY She  has a past surgical history that includes Cataract extraction (Right). FAMILY HISTORY Her family history includes Heart attack in her father and mother; Hypertension in her father and mother. SOCIAL HISTORY She  reports that she has never smoked. She does not have any smokeless tobacco history on file. She reports that she does  not drink alcohol or use drugs.   Objective:   Blood pressure 130/60, pulse 70, temperature 97.2 F (36.2 C), resp. rate 14, height 5\' 2"  (1.575 m), weight 131 lb 6.4 oz (59.6 kg), SpO2 96 %. Body mass index is 24.03 kg/m.  General appearance: alert, no distress, WD/WN,  female HEENT: normocephalic, sclerae anicteric, TMs pearly, nares patent, no discharge or erythema, pharynx normal Oral cavity: MMM, no lesions Neck: supple, no lymphadenopathy, no thyromegaly, no masses Heart: RRR, normal S1, S2 Lungs: rhonchi  LLL.  no wheezes, rhonchi, or rales Abdomen: +bs, soft, non tender, non distended, no masses, no hepatomegaly, no splenomegaly Musculoskeletal: nontender, no swelling, no obvious deformity Extremities: no edema, no cyanosis, no clubbing Pulses: 2+ symmetric, upper and lower extremities, normal cap refill Neurological: alert, oriented x 2, CN2-12 intact, strength decreased upper extremities and lower extremities, sensation decreased throughout bilateral feet, unsteady gait, walks with walker Psychiatric: normal affect, behavior normal, pleasant  Breast: defer Gyn: defer Rectal: defer  EKG: declines due to age   Quentin Mullingmanda Collier, [place] JerseyPA-C   04/30/2016

## 2016-04-30 NOTE — Patient Instructions (Signed)
Dizziness Dizziness is a common problem. It is a feeling of unsteadiness or light-headedness. You may feel like you are about to faint. Dizziness can lead to injury if you stumble or fall. Anyone can become dizzy, but dizziness is more common in older adults. This condition can be caused by a number of things, including medicines, dehydration, or illness. Follow these instructions at home: Taking these steps may help with your condition: Eating and drinking   Drink enough fluid to keep your urine clear or pale yellow. This helps to keep you from becoming dehydrated. Try to drink more clear fluids, such as water.  Do not drink alcohol.  Limit your caffeine intake if directed by your health care provider.  Limit your salt intake if directed by your health care provider. Activity   Avoid making quick movements.  Rise slowly from chairs and steady yourself until you feel okay.  In the morning, first sit up on the side of the bed. When you feel okay, stand slowly while you hold onto something until you know that your balance is fine.  Move your legs often if you need to stand in one place for a long time. Tighten and relax your muscles in your legs while you are standing.  Do not drive or operate heavy machinery if you feel dizzy.  Avoid bending down if you feel dizzy. Place items in your home so that they are easy for you to reach without leaning over. Lifestyle   Do not use any tobacco products, including cigarettes, chewing tobacco, or electronic cigarettes. If you need help quitting, ask your health care provider.  Try to reduce your stress level, such as with yoga or meditation. Talk with your health care provider if you need help. General instructions   Watch your dizziness for any changes.  Take medicines only as directed by your health care provider. Talk with your health care provider if you think that your dizziness is caused by a medicine that you are taking.  Tell a friend  or a family member that you are feeling dizzy. If he or she notices any changes in your behavior, have this person call your health care provider.  Keep all follow-up visits as directed by your health care provider. This is important. Contact a health care provider if:  Your dizziness does not go away.  Your dizziness or light-headedness gets worse.  You feel nauseous.  You have reduced hearing.  You have new symptoms.  You are unsteady on your feet or you feel like the room is spinning. Get help right away if:  You vomit or have diarrhea and are unable to eat or drink anything.  You have problems talking, walking, swallowing, or using your arms, hands, or legs.  You feel generally weak.  You are not thinking clearly or you have trouble forming sentences. It may take a friend or family member to notice this.  You have chest pain, abdominal pain, shortness of breath, or sweating.  Your vision changes.  You notice any bleeding.  You have a headache.  You have neck pain or a stiff neck.  You have a fever. This information is not intended to replace advice given to you by your health care provider. Make sure you discuss any questions you have with your health care provider. Document Released: 07/25/2000 Document Revised: 07/07/2015 Document Reviewed: 01/25/2014 Elsevier Interactive Patient Education  2017 Elsevier Inc.  

## 2016-05-01 LAB — URINE CULTURE: ORGANISM ID, BACTERIA: NO GROWTH

## 2016-05-01 LAB — MICROALBUMIN / CREATININE URINE RATIO
CREATININE, URINE: 78 mg/dL (ref 20–320)
MICROALB UR: 2.5 mg/dL
MICROALB/CREAT RATIO: 32 ug/mg{creat} — AB (ref <30)

## 2016-05-01 LAB — URINALYSIS, ROUTINE W REFLEX MICROSCOPIC
Bilirubin Urine: NEGATIVE
GLUCOSE, UA: NEGATIVE
Hgb urine dipstick: NEGATIVE
Ketones, ur: NEGATIVE
Leukocytes, UA: NEGATIVE
Nitrite: NEGATIVE
Protein, ur: NEGATIVE
SPECIFIC GRAVITY, URINE: 1.01 (ref 1.001–1.035)
pH: 5.5 (ref 5.0–8.0)

## 2016-05-01 LAB — MAGNESIUM: Magnesium: 1.6 mg/dL (ref 1.5–2.5)

## 2016-07-12 ENCOUNTER — Other Ambulatory Visit: Payer: Self-pay | Admitting: Internal Medicine

## 2016-07-13 ENCOUNTER — Other Ambulatory Visit: Payer: Self-pay

## 2016-07-13 MED ORDER — SILVER SULFADIAZINE 1 % EX CREA: 1.0000 "application " | TOPICAL_CREAM | Freq: Every day | CUTANEOUS | 0 refills | Status: DC

## 2016-08-22 ENCOUNTER — Other Ambulatory Visit: Payer: Self-pay | Admitting: Internal Medicine

## 2016-09-03 ENCOUNTER — Other Ambulatory Visit: Payer: Self-pay | Admitting: Internal Medicine

## 2016-09-03 MED ORDER — TRAMADOL HCL 50 MG PO TABS: ORAL_TABLET | ORAL | 0 refills | Status: DC

## 2016-09-04 ENCOUNTER — Ambulatory Visit: Payer: Medicare Other | Admitting: Neurology

## 2016-10-02 ENCOUNTER — Ambulatory Visit: Payer: Self-pay | Admitting: Physician Assistant

## 2016-10-13 ENCOUNTER — Emergency Department (HOSPITAL_COMMUNITY): Payer: Medicare Other

## 2016-10-13 ENCOUNTER — Encounter (HOSPITAL_COMMUNITY): Payer: Self-pay | Admitting: *Deleted

## 2016-10-13 ENCOUNTER — Emergency Department (HOSPITAL_COMMUNITY)
Admission: EM | Admit: 2016-10-13 | Discharge: 2016-10-13 | Disposition: A | Discharge status: Home / Self Care | Payer: Medicare Other | Attending: Emergency Medicine | Admitting: Emergency Medicine

## 2016-10-13 DIAGNOSIS — E119 Type 2 diabetes mellitus without complications: Secondary | ICD-10-CM | POA: Insufficient documentation

## 2016-10-13 DIAGNOSIS — Z79899 Other long term (current) drug therapy: Secondary | ICD-10-CM | POA: Diagnosis not present

## 2016-10-13 DIAGNOSIS — E785 Hyperlipidemia, unspecified: Secondary | ICD-10-CM | POA: Diagnosis not present

## 2016-10-13 DIAGNOSIS — R531 Weakness: Secondary | ICD-10-CM | POA: Diagnosis present

## 2016-10-13 DIAGNOSIS — R109 Unspecified abdominal pain: Secondary | ICD-10-CM | POA: Diagnosis not present

## 2016-10-13 DIAGNOSIS — Z7982 Long term (current) use of aspirin: Secondary | ICD-10-CM | POA: Diagnosis not present

## 2016-10-13 DIAGNOSIS — R1084 Generalized abdominal pain: Secondary | ICD-10-CM | POA: Diagnosis not present

## 2016-10-13 DIAGNOSIS — I10 Essential (primary) hypertension: Secondary | ICD-10-CM | POA: Diagnosis not present

## 2016-10-13 DIAGNOSIS — R918 Other nonspecific abnormal finding of lung field: Secondary | ICD-10-CM | POA: Diagnosis not present

## 2016-10-13 LAB — BASIC METABOLIC PANEL
ANION GAP: 5 (ref 5–15)
BUN: 17 mg/dL (ref 6–20)
CALCIUM: 9.3 mg/dL (ref 8.9–10.3)
CO2: 28 mmol/L (ref 22–32)
Chloride: 106 mmol/L (ref 101–111)
Creatinine, Ser: 0.9 mg/dL (ref 0.44–1.00)
GFR, EST NON AFRICAN AMERICAN: 54 mL/min — AB (ref >60)
Glucose, Bld: 181 mg/dL — ABNORMAL HIGH (ref 65–99)
Potassium: 3.6 mmol/L (ref 3.5–5.1)
Sodium: 139 mmol/L (ref 135–145)

## 2016-10-13 LAB — HEPATIC FUNCTION PANEL
ALBUMIN: 3 g/dL — AB (ref 3.5–5.0)
ALT: 30 U/L (ref 14–54)
AST: 45 U/L — ABNORMAL HIGH (ref 15–41)
Alkaline Phosphatase: 61 U/L (ref 38–126)
BILIRUBIN TOTAL: 0.5 mg/dL (ref 0.3–1.2)
Bilirubin, Direct: <0.1 mg/dL — ABNORMAL LOW (ref 0.1–0.5)
TOTAL PROTEIN: 7.7 g/dL (ref 6.5–8.1)

## 2016-10-13 LAB — I-STAT TROPONIN, ED: TROPONIN I, POC: 0.01 ng/mL (ref 0.00–0.08)

## 2016-10-13 LAB — LIPASE, BLOOD: Lipase: 25 U/L (ref 11–51)

## 2016-10-13 LAB — CBC
HCT: 35.5 % — ABNORMAL LOW (ref 36.0–46.0)
HEMOGLOBIN: 12 g/dL (ref 12.0–15.0)
MCH: 33.1 pg (ref 26.0–34.0)
MCHC: 33.8 g/dL (ref 30.0–36.0)
MCV: 97.8 fL (ref 78.0–100.0)
Platelets: 142 10*3/uL — ABNORMAL LOW (ref 150–400)
RBC: 3.63 MIL/uL — AB (ref 3.87–5.11)
RDW: 12.9 % (ref 11.5–15.5)
WBC: 6 10*3/uL (ref 4.0–10.5)

## 2016-10-13 LAB — URINALYSIS, ROUTINE W REFLEX MICROSCOPIC
BILIRUBIN URINE: NEGATIVE
Glucose, UA: NEGATIVE mg/dL
Hgb urine dipstick: NEGATIVE
KETONES UR: NEGATIVE mg/dL
Leukocytes, UA: NEGATIVE
NITRITE: NEGATIVE
Protein, ur: NEGATIVE mg/dL
Specific Gravity, Urine: 1.012 (ref 1.005–1.030)
pH: 7 (ref 5.0–8.0)

## 2016-10-13 LAB — I-STAT CG4 LACTIC ACID, ED
LACTIC ACID, VENOUS: 1.18 mmol/L (ref 0.5–1.9)
LACTIC ACID, VENOUS: 0.84 mmol/L (ref 0.5–1.9)

## 2016-10-13 MED ORDER — IOPAMIDOL (ISOVUE-300) INJECTION 61%
INTRAVENOUS | Status: AC
  Administered 2016-10-13: 100 mL
  Filled 2016-10-13: qty 100

## 2016-10-13 MED ORDER — ONDANSETRON HCL 4 MG/2ML IJ SOLN: 4.0000 mg | Freq: Once | INTRAMUSCULAR | Status: DC

## 2016-10-13 MED ORDER — ONDANSETRON 4 MG PO TBDP: 4.0000 mg | ORAL_TABLET | Freq: Three times a day (TID) | ORAL | 0 refills | Status: DC | PRN

## 2016-10-13 MED ORDER — ONDANSETRON 4 MG PO TBDP
4.0000 mg | ORAL_TABLET | Freq: Once | ORAL | Status: AC
  Administered 2016-10-13: 4 mg via ORAL
  Filled 2016-10-13: qty 1

## 2016-10-13 NOTE — ED Provider Notes (Signed)
[facility]-EMERGENCY DEPT Provider Note   CSN: [id number] Arrival date & time: 10/13/16  1818     History   Chief Complaint Chief Complaint  Patient presents with  . Altered Mental Status  . Weakness    HPI [name] is a 83 y.o. female.  83 yo F with a chief complaint of weakness. Per the family she has been sleeping more for the past couple days. She's not wanted to eat or drink. She currently denies any complaint. At home they stated that she was having some abdominal pain that went into her back. Denies any vomiting or diarrhea. Denies chest pain or shortness of breath. Denied dysuria or increased frequency or hesitancy.   The history is provided by the patient and a relative.  Illness  This is a new problem. The current episode started more than 2 days ago. The problem occurs constantly. The problem has not changed since onset.Associated symptoms include abdominal pain. Pertinent negatives include no chest pain, no headaches and no shortness of breath. Nothing aggravates the symptoms. Nothing relieves the symptoms. She has tried nothing for the symptoms. The treatment provided no relief.    Past Medical History:  Diagnosis Date  . Allergy   . Arthritis   . Diabetes mellitus without complication (HCC)   . DJD (degenerative joint disease)   . Glaucoma   . Hepatitis C   . Hyperlipidemia   . Hypertension   . Thyroid disease   . Vitamin D deficiency     Patient Active Problem List   Diagnosis Date Noted  . Encounter for Medicare annual wellness exam 09/08/2014  . Knee pain, bilateral 06/14/2014  . Hypothyroidism 02/23/2014  . Encounter for long-term (current) use of medications 08/20/2013  . Vitamin D deficiency 02/19/2013  . Seizure (HCC) 06/13/2012  . Hepatitis C 06/10/2012  . Normocytic anemia 06/10/2012  . T2_NIDDM Marikay Alar 06/10/2012  . Mixed hyperlipidemia 06/10/2012  . Essential hypertension 06/10/2012    Past Surgical History:  Procedure Laterality Date    . CATARACT EXTRACTION Right     OB History    No data available       Home Medications    Prior to Admission medications   Medication Sig Start Date End Date Taking? Authorizing Provider  ALPRAZolam (XANAX) 0.25 MG tablet Take 1 tablet (0.25 mg total) by mouth 3 (three) times daily as needed for anxiety. 02/23/16 02/22/17  Forcucci, Courtney, PA-C  aspirin EC 81 MG tablet Take 81 mg by mouth daily.    [provider]  levothyroxine (SYNTHROID, LEVOTHROID) 50 MCG tablet TAKE 1 TABLET BY MOUTH  DAILY 08/22/16   Lucky Cowboy, MD  Multiple Vitamin (MULTI-VITAMIN DAILY PO) Take by mouth.    [provider]  silver sulfADIAZINE (SILVADENE) 1 % cream Apply 1 application topically daily. 07/13/16   Quentin Mulling, PA-C  traMADol Janean Sark) 50 MG tablet Take 1/2 to 1 tablet ONLY if needed for severe pain 09/03/16 12/04/16  Lucky Cowboy, MD    Family History Family History  Problem Relation Age of Onset  . Hypertension Mother   . Heart attack Mother   . Hypertension Father   . Heart attack Father     Social History Social History  Substance Use Topics  . Smoking status: Never Smoker  . Smokeless tobacco: Never Used  . Alcohol use No     Allergies   Patient has no known allergies.   Review of Systems Review of Systems  Constitutional: Negative for chills and  fever.  HENT: Negative for congestion and rhinorrhea.   Eyes: Negative for redness and visual disturbance.  Respiratory: Negative for shortness of breath and wheezing.   Cardiovascular: Negative for chest pain and palpitations.  Gastrointestinal: Positive for abdominal pain. Negative for nausea and vomiting.  Genitourinary: Negative for dysuria and urgency.  Musculoskeletal: Positive for back pain. Negative for arthralgias and myalgias.  Skin: Negative for pallor and wound.  Neurological: Positive for dizziness and weakness. Negative for headaches.     Physical Exam Updated Vital Signs There  were no vitals taken for this visit.  Physical Exam  Constitutional: She is oriented to person, place, and time. She appears well-developed and well-nourished. No distress.  HENT:  Head: Normocephalic and atraumatic.  Eyes: Pupils are equal, round, and reactive to light. EOM are normal.  Neck: Normal range of motion. Neck supple.  Cardiovascular: Normal rate and regular rhythm.  Exam reveals no gallop and no friction rub.   No murmur heard. Pulmonary/Chest: Effort normal. She has no wheezes. She has no rales.  Abdominal: Soft. She exhibits no distension and no mass. There is no tenderness. There is no guarding.  Musculoskeletal: She exhibits no edema or tenderness.  Neurological: She is alert and oriented to person, place, and time. She has normal strength. She exhibits normal muscle tone.  Grossly neuro intact  Skin: Skin is warm and dry. She is not diaphoretic.  Psychiatric: She has a normal mood and affect. Her behavior is normal.  Nursing note and vitals reviewed.    ED Treatments / Results  Labs (all labs ordered are listed, but only abnormal results are displayed) Labs Reviewed  CBC - Abnormal; Notable for the following:       Result Value   RBC 3.63 (*)    HCT 35.5 (*)    Platelets 142 (*)    All other components within normal limits  BASIC METABOLIC PANEL  URINALYSIS, ROUTINE W REFLEX MICROSCOPIC  I-STAT TROPONIN, ED  I-STAT CG4 LACTIC ACID, ED    EKG  EKG Interpretation  Date/Time:  Saturday October 13 2016 18:26:37 EDT Ventricular Rate:  72 PR Interval:  190 QRS Duration: 76 QT Interval:  390 QTC Calculation: 427 R Axis:   -4 Text Interpretation:  Normal sinus rhythm Minimal voltage criteria for LVH, may be normal variant Borderline ECG No significant change since last tracing Confirmed by Melene Plan [phone]) on 10/13/2016 6:31:25 PM       Radiology No results found.  Procedures Procedures (including critical care time)  Medications Ordered in  ED Medications - No data to display   Initial Impression / Assessment and Plan / ED Course  I have reviewed the triage vital signs and the nursing notes.  Pertinent labs & imaging results that were available during my care of the patient were reviewed by me and considered in my medical decision making (see chart for details).     83yo F with a chief complaint of weakness. Going on for the past couple days. She started complaining of abdominal pain today and they brought her to the ED.  No focal findings on exam.  CT abd pelvis negative for pathology.  Start on antiemetics. PCP follow up.   10:47 PM:  I have discussed the diagnosis/risks/treatment options with the patient and family and believe the pt to be eligible for discharge home to follow-up with PCP. We also discussed returning to the ED immediately if new or worsening sx occur. We discussed the sx which are  most concerning (e.g., sudden worsening pain, fever, inability to tolerate by mouth) that necessitate immediate return. Medications administered to the patient during their visit and any new prescriptions provided to the patient are listed below.  Medications given during this visit Medications  ondansetron (ZOFRAN-ODT) disintegrating tablet 4 mg (not administered)  iopamidol (ISOVUE-300) 61 % injection (100 mLs  Contrast Given 10/13/16 2202)     The patient appears reasonably screen and/or stabilized for discharge and I doubt any other medical condition or other [facility] requiring further screening, evaluation, or treatment in the ED at this time prior to discharge.    Final Clinical Impressions(s) / ED Diagnoses   Final diagnoses:  None    New Prescriptions New Prescriptions   No medications on file     Melene PlanFloyd, Yosgart Pavey, DO 10/13/16 2248

## 2016-10-13 NOTE — ED Triage Notes (Signed)
Family reports that pt having weakness, fatigue and feeling near syncopal since Thursday. Having watery eyes and runny nose. Today reported abd pain and back pain.

## 2016-10-16 ENCOUNTER — Other Ambulatory Visit: Payer: Self-pay | Admitting: *Deleted

## 2016-10-16 ENCOUNTER — Other Ambulatory Visit: Payer: Self-pay | Admitting: Physician Assistant

## 2016-10-16 ENCOUNTER — Other Ambulatory Visit: Payer: Self-pay

## 2016-10-16 MED ORDER — TRAMADOL HCL 50 MG PO TABS: ORAL_TABLET | ORAL | 1 refills | Status: AC

## 2016-10-16 MED ORDER — TRAMADOL HCL 50 MG PO TABS: ORAL_TABLET | ORAL | 1 refills | Status: DC

## 2016-10-22 ENCOUNTER — Ambulatory Visit: Payer: Medicare Other | Admitting: Neurology

## 2016-10-30 NOTE — Progress Notes (Signed)
MEDICARE WELLNESS AND FOLLOW UP  Assessment:   Essential hypertension - continue medications, DASH diet, exercise and monitor at home. Call if greater than 130/80.  - BASIC METABOLIC PANEL WITH GFR - Hepatic function panel  T2_NIDDM /CKD3 Discussed general issues about diabetes pathophysiology and management., Educational material distributed., Suggested low cholesterol diet., Encouraged aerobic exercise., Discussed foot care., Reminded to get yearly retinal exam. - Hemoglobin A1c   Hypothyroidism, unspecified hypothyroidism type Hypothyroidism-check TSH level, continue medications the same, reminded to take on an empty stomach 30-50mins before food.  - TSH   Mixed hyperlipidemia -continue medications, check lipids, decrease fatty foods, increase activity.  - Lipid panel  Normocytic anemia - CBC with Differential/Platelet   Seizure remission   Vitamin D deficiency - Vit D  25 hydroxy (rtn osteoporosis monitoring)  Encounter for long-term (current) use of medications - Magnesium  Hepatitis C virus infection without hepatic coma, unspecified chronicity remission  Knee pain, bilateral - traMADol (ULTRAM) 50 MG tablet; Take 1 tablet (50 mg total) by mouth every 12 (twelve) hours as needed for moderate pain or severe pain.  Dispense: 60 tablet; Refill: 0 Takes very rarely  Medicare wellness Declines wellness/advances directives Discussed hospice for the future with the daughter, she states will do it for her mom when she is "100 and there is nothing wrong with her."   SDAT discussed with age, it is normal/age related  Advanced care counseling/discussion Discussed advanced care planning with  Family/daughter At least 30 mins discussed with the daughter about end of life planning, hospice, etc.  Declines at this time, will think about it.   Over 30 minutes of exam, counseling, chart review, and critical decision making was performed Future Appointments Date Time  Provider Department Center  12/11/2016 2:00 PM York Spaniel, MD [facility] None  05/01/2017 2:00 PM Quentin Mulling, PA-C [facility] None   During the course of the visit the patient was educated and counseled about appropriate screening and preventive services including:    Pneumococcal vaccine   Influenza vaccine  Td vaccine  Screening electrocardiogram  Screening mammography  Bone densitometry screening  Colorectal cancer screening  Diabetes screening  Glaucoma screening  Nutrition counseling   Advanced directives: given info/requested   Subjective:   [name] is a 83 y.o. female who presents for medicare wellness and 3 month follow up on hypertension, diabetes, hyperlipidemia, vitamin D def.   Her blood pressure has been controlled at home,  today their BP is BP: 122/84.  She does not workout. She denies chest pain, shortness of breath, dizziness.  She is not on cholesterol medication and denies myalgias. Her cholesterol is not at goal. The cholesterol last visit was:   Lab Results  Component Value Date   CHOL 203 (H) 12/20/2014   HDL 64 12/20/2014   LDLCALC 114 12/20/2014   TRIG 124 12/20/2014   CHOLHDL 3.2 12/20/2014   She has been working on diet and exercise for diabetes with CKD, she is off ARB due to hypotension, she is off the metformin and is diet controlled, sugars have been monitored very closely by her daughter, and denies paresthesia of the feet, polydipsia, polyuria and visual disturbances. Last A1C in the office was:  Lab Results  Component Value Date   HGBA1C 6.8 (H) 10/25/2015   Patient is on Vitamin D supplement. Lab Results  Component Value Date   VD25OH 45 09/15/2014     Patient lives with her daughter, denies any falls in the past year.  She complains of chronic pain in bilateral knees and follows with Dr. Heron Nay, takes tramadol occ, Walks with walker. BM is good.  She does not take xanax since daughter passed.  She has  dementia and her daughter provides the majority of the history.  She has a history of GERD.  She is on thyroid medication. Her medication was not changed last visit.   Lab Results  Component Value Date   TSH 4.56 (H) 04/30/2016  .  BMI is Body mass index is 22.13 kg/m., she is working on diet and exercise. Wt Readings from Last 3 Encounters:  10/31/16 121 lb (54.9 kg)  04/30/16 131 lb 6.4 oz (59.6 kg)  02/23/16 124 lb (56.2 kg)    Medication Review Current Outpatient Prescriptions on File Prior to Visit  Medication Sig Dispense Refill  . ALPRAZolam (XANAX) 0.25 MG tablet Take 1 tablet (0.25 mg total) by mouth 3 (three) times daily as needed for anxiety. 30 tablet 0  . aspirin EC 81 MG tablet Take 81 mg by mouth daily.    . Black Currant Seed Oil 500 MG CAPS Take 1 capsule by mouth 2 (two) times daily.    . cetirizine (ZYRTEC) 10 MG tablet Take 10 mg by mouth daily.    Marland Kitchen docusate sodium (COLACE) 100 MG capsule Take 100-200 mg by mouth 2 (two) times daily. Takes 200 mg in the morning and 100 mg at night    . levothyroxine (SYNTHROID, LEVOTHROID) 50 MCG tablet TAKE 1 TABLET BY MOUTH  DAILY 90 tablet 1  . Multiple Vitamin (MULTI-VITAMIN DAILY PO) Take by mouth.    . silver sulfADIAZINE (SILVADENE) 1 % cream Apply 1 application topically daily. 50 g 0  . traMADol (ULTRAM) 50 MG tablet Take 1 tablet twice a day ONLY if needed for severe pain 180 tablet 1   No current facility-administered medications on file prior to visit.     Current Problems (verified) Patient Active Problem List   Diagnosis Date Noted  . Encounter for Medicare annual wellness exam 09/08/2014  . Knee pain, bilateral 06/14/2014  . Hypothyroidism 02/23/2014  . Encounter for long-term (current) use of medications 08/20/2013  . Vitamin D deficiency 02/19/2013  . Seizure (HCC) 06/13/2012  . Hepatitis C 06/10/2012  . Normocytic anemia 06/10/2012  . T2_NIDDM Marikay Alar 06/10/2012  . Mixed hyperlipidemia 06/10/2012  .  Essential hypertension 06/10/2012    Screening Tests Immunization History  Administered Date(s) Administered  . Influenza Whole 10/29/2012  . Influenza, High Dose Seasonal PF 11/23/2013, 12/20/2014  . Pneumococcal Conjugate-13 04/30/2016  . Pneumococcal Polysaccharide-23 07/15/2008    Preventative care:  Last colonoscopy: declines Last mammogram: declines Last pap smear/pelvic exam: declines   DEXA:declines  Prior vaccinations: TD or Tdap: declines  Influenza: 2017 Pneumococcal: 2010 Prevnar 13: declines Shingles/Zostavax: declines.   Names of Other Physician/Practitioners you currently use: 1. [facility]- here for primary care Patient Care Team: Lucky Cowboy, MD as PCP - General (Internal Medicine) Sallye Lat, MD as Consulting Physician (Optometry) Sharrell Ku, MD as Consulting Physician (Gastroenterology) Marcene Corning, MD as Consulting Physician (Orthopedic Surgery) Brooke Dare, MD as Consulting Physician (Gastroenterology)  Allergies No Known Allergies  SURGICAL HISTORY She  has a past surgical history that includes Cataract extraction (Right). FAMILY HISTORY Her family history includes Heart attack in her father and mother; Hypertension in her father and mother. SOCIAL HISTORY She  reports that she has never smoked. She has never used smokeless tobacco. She reports that she  does not drink alcohol or use drugs.  MEDICARE WELLNESS OBJECTIVES: Physical activity: Current Exercise Habits: The patient does not participate in regular exercise at present Cardiac risk factors: Cardiac Risk Factors include: advanced age (>3men, >84 women);diabetes mellitus;dyslipidemia;family history of premature cardiovascular disease;hypertension;sedentary lifestyle Depression/mood screen:   Depression screen [facility] 2/9 10/31/2016  Decreased Interest 0  Down, Depressed, Hopeless 0  PHQ - 2 Score 0    ADLs:  In your present  state of health, do you have any difficulty performing the following activities: 10/31/2016  Hearing? Y  Vision? Y  Difficulty concentrating or making decisions? Y  Walking or climbing stairs? Y  Dressing or bathing? Y  Doing errands, shopping? Y  Comment daughter drives  Quarry manager and eating ? Y  Using the Toilet? Y  In the past six months, have you accidently leaked urine? Y  Do you have problems with loss of bowel control? Y  Managing your Medications? Y  Managing your Finances? Y  Housekeeping or managing your Housekeeping? Y  Some recent data might be hidden     Cognitive Testing  Alert? Yes  Normal Appearance?Yes  Oriented to person? Yes  Place? No   Time? No  Recall of three objects?  No  Can perform simple calculations?  No  Displays appropriate judgment? No  Can read the correct time from a watch face? No  EOL planning: Does Patient Have a Medical Advance Directive?: No Would patient like information on creating a medical advance directive?: No - Patient declined   Objective:   Blood pressure 122/84, pulse 64, temperature (!) 97.3 F (36.3 C), resp. rate 16, height  (1.575 m), weight 121 lb (54.9 kg), SpO2 97 %. Body mass index is 22.13 kg/m.  General appearance: alert, no distress, WD/WN,  female HEENT: normocephalic, sclerae anicteric, TMs pearly, nares patent, no discharge or erythema, pharynx normal Oral cavity: MMM, no lesions Neck: supple, no lymphadenopathy, no thyromegaly, no masses Heart: RRR, normal S1, S2 Lungs:  no wheezes, rhonchi, or rales Abdomen: +bs, soft, non tender, non distended, no masses, no hepatomegaly, no splenomegaly Musculoskeletal: nontender, no swelling, no obvious deformity Extremities: no edema, no cyanosis, no clubbing Pulses: 2+ symmetric, upper and lower extremities, normal cap refill Neurological: alert, oriented x 2, CN2-12 intact, strength decreased upper extremities and lower extremities, sensation decreased  throughout bilateral feet, unsteady gait, walks with walker Psychiatric: normal affect, behavior normal, pleasant  Breast: defer Gyn: defer Rectal: defer  EKG: declines due to age  Medicare Attestation I have personally reviewed: The patient's medical and social history Their use of alcohol, tobacco or illicit drugs Their current medications and supplements The patient's functional ability including ADLs,fall risks, home safety risks, cognitive, and hearing and visual impairment Diet and physical activities Evidence for depression or mood disorders  The patient's weight, height, BMI, and visual acuity have been recorded in the chart.  I have made referrals, counseling, and provided education to the patient based on review of the above and I have provided the patient with a written personalized care plan for preventive services.     Quentin Mulling, PA-C   10/31/2016

## 2016-10-31 ENCOUNTER — Ambulatory Visit: Payer: Self-pay | Admitting: Internal Medicine

## 2016-10-31 ENCOUNTER — Ambulatory Visit (INDEPENDENT_AMBULATORY_CARE_PROVIDER_SITE_OTHER): Payer: Medicare Other | Admitting: Physician Assistant

## 2016-10-31 ENCOUNTER — Encounter: Payer: Self-pay | Admitting: Physician Assistant

## 2016-10-31 VITALS — BP 122/84 | HR 64 | Temp 97.3°F | Resp 16 | Ht 62.0 in | Wt 121.0 lb

## 2016-10-31 DIAGNOSIS — D649 Anemia, unspecified: Secondary | ICD-10-CM

## 2016-10-31 DIAGNOSIS — E039 Hypothyroidism, unspecified: Secondary | ICD-10-CM

## 2016-10-31 DIAGNOSIS — R569 Unspecified convulsions: Secondary | ICD-10-CM

## 2016-10-31 DIAGNOSIS — E1121 Type 2 diabetes mellitus with diabetic nephropathy: Secondary | ICD-10-CM | POA: Diagnosis not present

## 2016-10-31 DIAGNOSIS — B1920 Unspecified viral hepatitis C without hepatic coma: Secondary | ICD-10-CM | POA: Diagnosis not present

## 2016-10-31 DIAGNOSIS — E559 Vitamin D deficiency, unspecified: Secondary | ICD-10-CM | POA: Diagnosis not present

## 2016-10-31 DIAGNOSIS — Z79899 Other long term (current) drug therapy: Secondary | ICD-10-CM

## 2016-10-31 DIAGNOSIS — Z7189 Other specified counseling: Secondary | ICD-10-CM

## 2016-10-31 DIAGNOSIS — I10 Essential (primary) hypertension: Secondary | ICD-10-CM

## 2016-10-31 DIAGNOSIS — R6889 Other general symptoms and signs: Secondary | ICD-10-CM

## 2016-10-31 DIAGNOSIS — Z0000 Encounter for general adult medical examination without abnormal findings: Secondary | ICD-10-CM

## 2016-10-31 DIAGNOSIS — Z0001 Encounter for general adult medical examination with abnormal findings: Secondary | ICD-10-CM

## 2016-10-31 DIAGNOSIS — E782 Mixed hyperlipidemia: Secondary | ICD-10-CM | POA: Diagnosis not present

## 2016-10-31 NOTE — Patient Instructions (Signed)
Vascular Dementia Dementia is a condition in which a person has problems with thinking, memory, and behavior that are severe enough to interfere with daily life. Vascular dementia is a type of dementia. It results from brain damage that is caused by the brain not getting enough blood. Vascular dementia usually begins between 60 and 81 years of age. What are the causes? Vascular dementia is caused by conditions that lessen blood flow to the brain. Common causes include:  Multiple small strokes. These may happen without symptoms (silent stroke).  Major stroke.  Damage to small blood vessels in the brain (cerebral small vessel disease).  What increases the risk?  Advancing age.  Having had a stroke.  Having high blood pressure (hypertension) or high cholesterol.  Having a disease that affects the heart or blood vessels.  Smoking.  Having diabetes.  Being female.  Being obese.  Not being active.  Having depression. What are the signs or symptoms? Symptoms can vary a lot from one person to another. Symptoms may be mild or severe depending on the amount of damage and which parts of the brain have been affected. Symptoms may begin suddenly or may develop gradually. Symptoms may remain stable, or they may get worse over time. Symptoms of vascular dementia may be similar to those of Alzheimer disease. The two conditions can occur together (mixed dementia). Symptoms of vascular dementia may include: Mental  Confusion.  Memory problems.  Poor attention and concentration.  Trouble understanding speech.  Depression.  Personality changes.  Trouble recognizing familiar people.  Agitation or aggression.  Paranoia.  Delusions or hallucinations. Physical  Weakness.  Poor balance.  Loss of bladder or bowel control (incontinence).  Unsteady walking (gait).  Speaking problems. Behavioral  Getting lost in familiar places.  Problems with planning and  judgment.  Trouble following instructions.  Social problems.  Emotional outbursts.  Trouble with daily activities and self-care.  Problems handling money. How is this diagnosed? There is not a specific test to diagnose vascular dementia. The health care provider will consider the person's medical history and symptoms or changes that are reported by friends and family. The health care provider will do a physical exam and may order lab tests or other tests that check brain and nervous system function. Tests that may be done include:  Blood tests.  Brain imaging tests.  Tests of movement, speech, and other daily activities (neurological exam).  Tests of memory, thinking, and problem-solving (neuropsychological or neurocognitive testing).  Diagnosis may involve several specialists. These may include a health care provider who specializes in the brain and nervous system (neurologist), a provider who specializes in disorders of the mind (psychiatrist), and a provider who focuses on speech and language changes (speech pathologist). How is this treated? There is no cure for vascular dementia. Brain damage that has already occurred cannot be reversed. Treatment depends on:  How severe the condition is.  Which parts of the brain have been affected.  The person's overall health.  Treatment measures aim to:  Treat the underlying cause of vascular dementia and manage risk factors. This may include: ? Controlling blood pressure. ? Lowering cholesterol. ? Treating diabetes. ? Quitting smoking. ? Losing weight.  Manage symptoms.  Prevent further brain damage.  Improve the person's health and quality of life.  Treatment for dementia may involve a team of health care providers, including:  A neurologist.  A psychiatrist.  An occupational therapist.  A speech pathologist.  A cardiologist.  An exercise physiologist or physical   therapist.  Follow these instructions at  home: Home care for a person with vascular dementia depends on what caused the condition and how severe the symptoms are. General guidelines for care at home include:  Following the health care provider's instructions for treating the condition that caused the dementia.  Using medicines only as told by the person's health care provider.  Creating a safe living space.  Learning ways to help the person remember people, appointments, and daily activities.  Finding a support group to help caregivers and family to cope with the effects of dementia.  Helping family and friends learn about ways to communicate with a person who has dementia.  Making sure the person keeps all follow-up visits and goes to all rehabilitation appointments as told by the health care team. This is important.  Contact a health care provider if:  A fever develops.  New behavioral problems develop.  Problems with swallowing develop.  Confusion gets worse.  Sleepiness gets worse. Get help right away if:  Loss of consciousness occurs.  There is a sudden loss of speech, balance, or thinking ability.  New numbness or paralysis occurs.  Sudden, severe headache occurs.  Vision is lost or suddenly gets worse in one or both eyes. This information is not intended to replace advice given to you by your health care provider. Make sure you discuss any questions you have with your health care provider. Document Released: 01/19/2002 Document Revised: 07/07/2015 Document Reviewed: 05/12/2014 Elsevier Interactive Patient Education  2018 [employer].  Death and Dying When a person's health care team determines that a terminal illness can no longer be controlled, medical testing and treatment often stop. But the person's care continues. The care focuses on making the person comfortable. The person receives medicines and treatments to control pain and other symptoms, such as constipation, nausea, and shortness of breath.  Some people remain at home during this time, while others enter a hospice or other facility. Either way, services are available to help individuals and their families with the medical, psychological, and spiritual issues surrounding dying. A hospice team often provides such services. The time at the end of life is different for each person. Individuals and their families have unique needs for information and support. Family members often want to know how long their loved one is expected to live. This is a hard question to answer. Factors such as where the disease is located and whether the person has other illnesses can affect how long a person is expected to live. Questions and concerns about the end of life should be discussed with the health care team as they arise. When to ask for assistance When caring for your loved one at home, there may be times when you need assistance from your loved one's health care team. You can contact the health care team for help in any of the following situations:  Your loved one is in pain that is not relieved by the prescribed dose of pain medicine.  Your loved one shows discomfort, such as grimacing or moaning.  Your loved one is having trouble breathing and seems upset.  Your loved one is unable to urinate or empty the bowels.  Your loved one has fallen.  Your loved one is very depressed or talking about committing suicide.  You have difficulty giving medicine to your loved one.  You are overwhelmed by caring for your loved one or are too grieved or afraid to be with your loved one.  Any time  you do not know how to handle a situation.  Providing care and comfort Managing pain  Contact the health care provider if the prescribed pain-relieving medicine dose does not seem to help. With the help of the health care team, you can also explore methods such as massage and relaxation techniques to help with pain.  Reposition your loved one's body from one side  to the back to the other side every few hours to prevent bed sores. Try to minimize pressure under heels and elbows by placing a pillow or foam pads where needed. Giving comfort  Keep your loved one as clean, dry, and comfortable as possible. Place disposable pads on the bed beneath the person and remove them when they become soiled.  Blankets can be used to warm your loved one. Although your loved one's skin may be cool, he or she is usually not aware of feeling cold. You should avoid warming your loved one with electric blankets or heating pads. These can cause burns.  Breathing may be easier if you turn your loved one's body to the side and place pillows beneath the head and behind the back. Although labored breathing can sound very distressing to you, gurgling and rattling sounds do not cause discomfort to your loved one. An external source of oxygen may benefit some individuals. If your loved one is able to swallow, ice chips also may help. In addition, a cool mist humidifier may help make your loved one's breathing more comfortable. You may also use a fan to circulate the air.  Allow your loved one to choose if and when to eat or drink. Ice chips, water, or juice may be refreshing if the person can swallow. Keep the person's mouth and lips moist with products such as glycerin swabs and lip balm. Emotional support Everyone has different needs, but some emotions are common to most individuals who are dying. These include fear of abandonment and fear of being a burden. They also have concerns about loss of dignity and loss of control. Some ways you can provide comfort are as follows:  Keep your loved one company. Talk, watch movies, read, or just be with him or her.  Allow your loved one to express fears and concerns about dying, such as leaving family and friends behind. Be prepared to listen.  Be willing to reminisce about your loved one's life.  Avoid withholding difficult information. Most  people prefer to be included in discussions about issues that concern them.  Reassure your loved one that you will honor advance directives, such as living wills.  Ask if there is anything you can do.  Respect your loved one's need for privacy.  Communicating with a dying loved one  Plan visits and activities for times when your loved one is alert. It is important to speak directly to your loved one and talk as if he or she can hear, even if there is no response. Most people are still able to hear after they are no longer able to speak. Your loved one should not be shaken if he or she does not respond.  Gently remind your loved one of the time, date, and people who are present. If your loved one is agitated, do not attempt to restrain him or her. Be calm and reassuring. Speaking calmly may help to re-orient your loved one. What to expect when death is near Certain signs and symptoms can help you anticipate when death is near. They are described below, along with suggestions  for managing them. It is important to remember that not every person experiences each of the signs and symptoms. Having one or more of these symptoms does not always indicate that your loved one is close to death. A member of your loved one's health care team can give you information about what to expect.  Drowsiness, increased sleep, or unresponsiveness. This is caused by changes in the person's metabolism.  Confusion about time, place, or identity of family and friends; restlessness; visions of people and places that are not present; pulling at bed linens or clothing (caused in part by changes in your loved one's metabolism).  Withdrawal and decreased socialization. This may be caused by decreased oxygen to the brain, decreased blood flow, and mental preparation for dying.  Decreased need for food and fluids, and loss of appetite. This is caused by the body's need to conserve energy and its decreasing ability to use food  and fluids properly.  Loss of bladder or bowel control. This is caused by the relaxing of muscles in the pelvic area. You can talk to your loved one's health care team about the possibility of inserting a catheter. A member of the health care team can teach you how to take care of the catheter, if one is needed.  Skin becomes cool to the touch, particularly the hands and feet. Skin may become bluish in color, especially on the underside of the body. This is caused by decreased circulation to the extremities.  Rattling or gurgling sounds while breathing that may be loud; breathing that is irregular and shallow; decreased number of breaths per minute; breathing that alternates between rapid and slow. This is caused by congestion from fluid, a buildup of waste products in the body, and a decrease in circulation to the organs.  Turning the head toward a light source. This is caused by decreasing vision. Leave soft, indirect lights on in the room.  Increased difficulty controlling pain. This is caused by progression of the disease. It is important to provide pain medicines as your loved one's health care provider has prescribed.  Involuntary movements, changes in heart rate, and loss of reflexes in the legs and arms are also signs that the end of life is near.  What are the signs that the person has died?  There is no breathing or pulse.  The eyes do not move or blink, and the pupils are enlarged (dilated) and do not change with light. The eyelids may be slightly open.  The jaw is relaxed, and the mouth is slightly open.  The body releases the bowel and bladder contents.  The person does not respond to being touched or spoken to. What should happen after the person has died? After the person has passed away, there is no need to hurry with arrangements. Family members and close friends may wish to sit with the person, talk, or pray. When the family is ready, the following steps can be  taken: 1. Place your loved one on his or her back with one pillow under the head. If necessary, you may wish to put your loved one's dentures or other artificial parts in place. 2. If your loved one is in a hospice program, follow the guidelines provided by the program. You can request a hospice nurse to verify the person's death. 3. Contact the appropriate authorities in accordance with local regulations. If your loved one has requested not to be resuscitated through a Do Not Resuscitate (DNR) order or other mechanism, do not  call 911. 4. Contact your loved one's health care provider and funeral home. 5. Contact other family members, friends, and clergy who may not be aware of your loved one's passing. 6. Obtain emotional support to cope with the loss of your loved one.  Questions to ask about death and dying  How long is the person expected to live? Where to find more information:  [facility] Organization: [employer]  [employer] on Aging: https://walker.com/ This information is not intended to replace advice given to you by your health care provider. Make sure you discuss any questions you have with your health care provider. Document Released: 11/08/2007 Document Revised: 07/07/2015 Document Reviewed: 07/20/2012 Elsevier Interactive Patient Education  2017 [employer].

## 2016-11-01 LAB — BASIC METABOLIC PANEL WITH GFR
BUN/Creatinine Ratio: 19 (calc) (ref 6–22)
BUN: 19 mg/dL (ref 7–25)
CALCIUM: 9.3 mg/dL (ref 8.6–10.4)
CO2: 27 mmol/L (ref 20–32)
Chloride: 104 mmol/L (ref 98–110)
Creat: 1 mg/dL — ABNORMAL HIGH (ref 0.60–0.88)
GFR, EST AFRICAN AMERICAN: 57 mL/min/{1.73_m2} — AB (ref >=60)
GFR, Est Non African American: 49 mL/min/{1.73_m2} — ABNORMAL LOW (ref >=60)
Glucose, Bld: 226 mg/dL — ABNORMAL HIGH (ref 65–99)
Potassium: 4.4 mmol/L (ref 3.5–5.3)
Sodium: 137 mmol/L (ref 135–146)

## 2016-11-01 LAB — HEPATIC FUNCTION PANEL
AG Ratio: 0.8 (calc) — ABNORMAL LOW (ref 1.0–2.5)
ALBUMIN MSPROF: 3.3 g/dL — AB (ref 3.6–5.1)
ALT: 23 U/L (ref 6–29)
AST: 32 U/L (ref 10–35)
Alkaline phosphatase (APISO): 63 U/L (ref 33–130)
BILIRUBIN DIRECT: 0.1 mg/dL (ref 0.0–0.2)
BILIRUBIN TOTAL: 0.4 mg/dL (ref 0.2–1.2)
GLOBULIN: 3.9 g/dL — AB (ref 1.9–3.7)
Indirect Bilirubin: 0.3 mg/dL (calc) (ref 0.2–1.2)
Total Protein: 7.2 g/dL (ref 6.1–8.1)

## 2016-11-01 LAB — HEMOGLOBIN A1C
HEMOGLOBIN A1C: 6.3 %{Hb} — AB (ref <5.7)
Mean Plasma Glucose: 134 (calc)
eAG (mmol/L): 7.4 (calc)

## 2016-11-01 LAB — CBC WITH DIFFERENTIAL/PLATELET
BASOS PCT: 0.4 %
Basophils Absolute: 21 cells/uL (ref 0–200)
Eosinophils Absolute: 99 cells/uL (ref 15–500)
Eosinophils Relative: 1.9 %
HEMATOCRIT: 33.2 % — AB (ref 35.0–45.0)
HEMOGLOBIN: 11.3 g/dL — AB (ref 11.7–15.5)
Lymphs Abs: 2434 cells/uL (ref 850–3900)
MCH: 33.3 pg — ABNORMAL HIGH (ref 27.0–33.0)
MCHC: 34 g/dL (ref 32.0–36.0)
MCV: 97.9 fL (ref 80.0–100.0)
MPV: 10.5 fL (ref 7.5–12.5)
Monocytes Relative: 9.4 %
NEUTROS ABS: 2158 {cells}/uL (ref 1500–7800)
NEUTROS PCT: 41.5 %
Platelets: 143 10*3/uL (ref 140–400)
RBC: 3.39 10*6/uL — AB (ref 3.80–5.10)
RDW: 12.4 % (ref 11.0–15.0)
TOTAL LYMPHOCYTE: 46.8 %
WBC mixed population: 489 cells/uL (ref 200–950)
WBC: 5.2 10*3/uL (ref 3.8–10.8)

## 2016-11-01 LAB — TSH: TSH: 4.59 m[IU]/L — AB (ref 0.40–4.50)

## 2016-11-01 NOTE — Progress Notes (Signed)
Pt aware of lab results & voiced understanding of those results.

## 2016-11-20 ENCOUNTER — Other Ambulatory Visit: Payer: Self-pay

## 2016-11-20 MED ORDER — CLOTRIMAZOLE-BETAMETHASONE 1-0.05 % EX CREA: TOPICAL_CREAM | CUTANEOUS | 1 refills | Status: AC

## 2016-11-21 ENCOUNTER — Ambulatory Visit: Payer: Self-pay

## 2016-11-22 ENCOUNTER — Ambulatory Visit: Payer: Self-pay

## 2016-11-23 ENCOUNTER — Ambulatory Visit: Payer: Self-pay

## 2016-11-26 ENCOUNTER — Ambulatory Visit (INDEPENDENT_AMBULATORY_CARE_PROVIDER_SITE_OTHER): Payer: Medicare Other

## 2016-11-26 VITALS — Ht 62.0 in

## 2016-11-26 DIAGNOSIS — Z23 Encounter for immunization: Secondary | ICD-10-CM

## 2016-12-03 DIAGNOSIS — M1712 Unilateral primary osteoarthritis, left knee: Secondary | ICD-10-CM | POA: Diagnosis not present

## 2016-12-03 DIAGNOSIS — M1711 Unilateral primary osteoarthritis, right knee: Secondary | ICD-10-CM | POA: Diagnosis not present

## 2016-12-05 ENCOUNTER — Telehealth: Payer: Self-pay | Admitting: Physician Assistant

## 2016-12-05 MED ORDER — PREDNISONE 20 MG PO TABS: ORAL_TABLET | ORAL | 0 refills | Status: DC

## 2016-12-05 MED ORDER — AZITHROMYCIN 250 MG PO TABS: ORAL_TABLET | ORAL | 1 refills | Status: AC

## 2016-12-05 NOTE — Telephone Encounter (Signed)
Calling with cough, sneezing, sinus drainage x 1 week, getting worse, on allergy pills. Will send in zpak, continue allergy pill, if any worse go to ER or if not better call for office visit.

## 2016-12-05 NOTE — Telephone Encounter (Signed)
Pt's caregiver has been informed that a Rx has been sent to the pharmacy on 24th Oct 2018 by DD

## 2016-12-11 ENCOUNTER — Ambulatory Visit (INDEPENDENT_AMBULATORY_CARE_PROVIDER_SITE_OTHER): Payer: Medicare Other | Admitting: Neurology

## 2016-12-11 ENCOUNTER — Telehealth: Payer: Self-pay | Admitting: *Deleted

## 2016-12-11 ENCOUNTER — Encounter: Payer: Self-pay | Admitting: Neurology

## 2016-12-11 DIAGNOSIS — G301 Alzheimer's disease with late onset: Secondary | ICD-10-CM | POA: Diagnosis not present

## 2016-12-11 DIAGNOSIS — G309 Alzheimer's disease, unspecified: Secondary | ICD-10-CM

## 2016-12-11 DIAGNOSIS — F0280 Dementia in other diseases classified elsewhere without behavioral disturbance: Secondary | ICD-10-CM

## 2016-12-11 DIAGNOSIS — F0281 Dementia in other diseases classified elsewhere with behavioral disturbance: Secondary | ICD-10-CM | POA: Diagnosis not present

## 2016-12-11 DIAGNOSIS — F02818 Dementia in other diseases classified elsewhere, unspecified severity, with other behavioral disturbance: Secondary | ICD-10-CM

## 2016-12-11 HISTORY — DX: Dementia in other diseases classified elsewhere, unspecified severity, without behavioral disturbance, psychotic disturbance, mood disturbance, and anxiety: F02.80

## 2016-12-11 NOTE — Progress Notes (Signed)
Reason for visit: Alzheimer's disease  Referring physician: Dr. Nori Riis [name] is a 84 y.o. female  History of present illness:  [name] is a 83 year old black female with a history of a progressive dementing illness over the last 8 years or so.  The patient lives with her daughter currently who needs to assist her with most activities of daily living.  The patient can feed herself, but she needs assistance with cooking, bathing and dressing.  The patient needs assistance with keeping up with medications and appointments, and with doing the finances.  The patient does not operate a motor vehicle.  The patient has significant alteration in vision, she has a false eye on the right and minimal vision on the left eye.  She walks with a walker, she does have some gait instability.  She has had alteration in her appetite, decreased appetite with some weight loss.  The patient does have occasional hallucinations and some occasional agitation.  There is a past history of seizures, but the patient is not on any medications for seizures and she has not had any recent events.  The family is trying to get guardianship for the patient currently.  The patient is sent to this office for further evaluation.  Past Medical History:  Diagnosis Date  . Allergy   . Arthritis   . Diabetes mellitus without complication (HCC)   . DJD (degenerative joint disease)   . Glaucoma   . Hepatitis C   . Hyperlipidemia   . Hypertension   . Thyroid disease   . Vitamin D deficiency     Past Surgical History:  Procedure Laterality Date  . CATARACT EXTRACTION Right     Family History  Problem Relation Age of Onset  . Hypertension Mother   . Heart attack Mother   . Hypertension Father   . Heart attack Father     Social history:  reports that she has never smoked. She has quit using smokeless tobacco. Her smokeless tobacco use included Snuff. She reports that she does not drink alcohol or use  drugs.  Medications:  Prior to Admission medications   Medication Sig Start Date End Date Taking? Authorizing Provider  ALPRAZolam (XANAX) 0.25 MG tablet Take 1 tablet (0.25 mg total) by mouth 3 (three) times daily as needed for anxiety. 02/23/16 02/22/17 Yes Forcucci, Courtney, PA-C  aspirin EC 81 MG tablet Take 81 mg by mouth daily.   Yes [provider]  Black Currant Seed Oil 500 MG CAPS Take 1 capsule by mouth 2 (two) times daily.   Yes [provider]  cetirizine (ZYRTEC) 10 MG tablet Take 10 mg by mouth daily.   Yes [provider]  clotrimazole-betamethasone (LOTRISONE) cream Apply to affected area 2 times daily 11/20/16 11/20/17 Yes Quentin Mulling, PA-C  docusate sodium (COLACE) 100 MG capsule Take 100-200 mg by mouth 2 (two) times daily. Takes 200 mg in the morning and 100 mg at night   Yes [provider]  levothyroxine (SYNTHROID, LEVOTHROID) 50 MCG tablet TAKE 1 TABLET BY MOUTH  DAILY 08/22/16  Yes Lucky Cowboy, MD  Multiple Vitamin (MULTI-VITAMIN DAILY PO) Take by mouth.   Yes [provider]  Polyethylene Glycol 3350 (MIRALAX PO) Take by mouth.   Yes [provider]  silver sulfADIAZINE (SILVADENE) 1 % cream Apply 1 application topically daily. 07/13/16  Yes Quentin Mulling, PA-C  traMADol (ULTRAM) 50 MG tablet Take 1 tablet twice a day ONLY if needed for  severe pain 10/16/16 01/16/17 Yes Quentin Mullingollier, Amanda, PA-C     No Known Allergies  ROS:  Out of a complete 14 system review of symptoms, the patient complains only of the following symptoms, and all other reviewed systems are negative.  Ringing in the ears Joint pain, aching muscles Allergies Memory loss, confusion, weakness Restless legs  Blood pressure (!) 110/50, pulse 90, height 5\' 3"  (1.6 m), weight 122 lb 8 oz (55.6 kg), SpO2 95 %.  Physical Exam  General: The patient is alert and cooperative at the time of the examination.  Eyes: The right eye is false.  The  patient has some corneal opacity on the left, evaluation of the pupil and the optic disc is difficult.  Neck: The neck is supple, no carotid bruits are noted.  Respiratory: The respiratory examination is clear.  Cardiovascular: The cardiovascular examination reveals a regular rate and rhythm, no obvious murmurs or rubs are noted.  Skin: Extremities are without significant edema.  Neurologic Exam  Mental status: The patient is alert and oriented x 2 at the time of the examination (not oriented to date). The Mini-Mental status examination done today reveals a total score of 10/30..  Cranial nerves: Facial symmetry is present. There is good sensation of the face to pinprick and soft touch bilaterally. The strength of the facial muscles and the muscles to head turning and shoulder shrug are normal bilaterally. Speech is well enunciated, no aphasia or dysarthria is noted. Extraocular movements are full. Visual fields are full. The tongue is midline, and the patient has symmetric elevation of the soft palate. No obvious hearing deficits are noted.  Motor: The motor testing reveals 5 over 5 strength of all 4 extremities. Good symmetric motor tone is noted throughout.  Sensory: Sensory testing is intact to pinprick, soft touch and vibration sensation on all 4 extremities. No evidence of extinction is noted.  Coordination: Cerebellar testing reveals good finger-nose-finger bilaterally.  The patient has difficulty with performing heel seen on either side secondary to some apraxia.  Gait and station: Gait is slightly wide-based, unsteady.  The patient can walk with minimal assistance.  Patient usually uses a cane for ambulation.  Tandem gait was not attempted.  Romberg is negative.  Reflexes: Deep tendon reflexes are symmetric, but are decreased bilaterally. Toes are downgoing bilaterally.   MRI brain 06/14/12:  IMPRESSION: No acute infarct.  Prominent small vessel disease type  changes.  Atrophy.  * MRI scan images were reviewed online. I agree with the written report.    Assessment/Plan:  1.  Alzheimer's disease  2.  Gait disorder  The patient has had a significant progressive dementing illness.  Currently, she requires assistance with most activities of daily living.  The patient has a gait disorder, and she is visually impaired as well.  At this time it is reasonable that the family obtain guardianship for her.  The patient is not competent to make medical, legal, or financial decisions.  The patient will follow-up through this office on an as-needed basis.    Marlan Palau. Keith Eliceo Gladu MD 12/11/2016 2:13 PM  [facility] [address] [place], [place] [id number]  Phone [phone] Fax [phone]

## 2016-12-11 NOTE — Telephone Encounter (Signed)
Called and spoke with Adela LankJacqueline ([place] Advised letter ready that CW,MD wrote as requested at [employer]. She would like it mailed to them. Advised I will put in mail today. She verbalized understanding and appreciation for call.

## 2016-12-24 DIAGNOSIS — H401133 Primary open-angle glaucoma, bilateral, severe stage: Secondary | ICD-10-CM | POA: Diagnosis not present

## 2016-12-24 DIAGNOSIS — Z961 Presence of intraocular lens: Secondary | ICD-10-CM | POA: Diagnosis not present

## 2016-12-24 DIAGNOSIS — H10413 Chronic giant papillary conjunctivitis, bilateral: Secondary | ICD-10-CM | POA: Diagnosis not present

## 2016-12-24 DIAGNOSIS — H1851 Endothelial corneal dystrophy: Secondary | ICD-10-CM | POA: Diagnosis not present

## 2016-12-28 ENCOUNTER — Telehealth: Payer: Self-pay | Admitting: Internal Medicine

## 2017-01-16 NOTE — Telephone Encounter (Signed)
error 

## 2017-02-05 ENCOUNTER — Other Ambulatory Visit: Payer: Self-pay | Admitting: Internal Medicine

## 2017-03-26 ENCOUNTER — Other Ambulatory Visit: Payer: Self-pay | Admitting: Internal Medicine

## 2017-04-29 NOTE — Progress Notes (Deleted)
CPE AND FOLLOW UP  Assessment:   Essential hypertension - continue medications, DASH diet, exercise and monitor at home. Call if greater than 130/80.  - BASIC METABOLIC PANEL WITH GFR - Hepatic function panel  T2_NIDDM /CKD3 Discussed general issues about diabetes pathophysiology and management., Educational material distributed., Suggested low cholesterol diet., Encouraged aerobic exercise., Discussed foot care., Reminded to get yearly retinal exam. - Hemoglobin A1c   Hypothyroidism, unspecified hypothyroidism type Hypothyroidism-check TSH level, continue medications the same, reminded to take on an empty stomach 30-4660mins before food.  - TSH   Mixed hyperlipidemia -continue medications, check lipids, decrease fatty foods, increase activity.  - Lipid panel  Normocytic anemia - CBC with Differential/Platelet   Seizure remission   Vitamin D deficiency - Vit D  25 hydroxy (rtn osteoporosis monitoring)  Encounter for long-term (current) use of medications - Magnesium  Hepatitis C virus infection without hepatic coma, unspecified chronicity remission  Urinary frequency - Urinalysis, Routine w reflex microscopic - Urine culture  Knee pain, bilateral - traMADol (ULTRAM) 50 MG tablet; Take 1 tablet (50 mg total) by mouth every 12 (twelve) hours as needed for moderate pain or severe pain.  Dispense: 60 tablet; Refill: 0  Need for prophylactic vaccination against Streptococcus pneumoniae (pneumococcus) -     Pneumococcal conjugate vaccine 13-valent IM   Over 30 minutes of exam, counseling, chart review, and critical decision making was performed Future Appointments  Date Time Provider Department Center  05/01/2017  2:00 PM Quentin Mullingollier, Katriana Dortch, PA-C [facility] None     Subjective:   [name] is a 84 y.o. female who presents for CPE and 3 month follow up on hypertension, diabetes, hyperlipidemia, vitamin D def.   Her blood pressure has been controlled at home, has  had some dizziness  2-3 weeks, has had some runny nose, not on any BP meds, today their BP is  .  She does not workout. She denies chest pain, shortness of breath, dizziness.  She is not on cholesterol medication and denies myalgias. Her cholesterol is not at goal. The cholesterol last visit was:   Lab Results  Component Value Date   CHOL 203 (H) 12/20/2014   HDL 64 12/20/2014   LDLCALC 114 12/20/2014   TRIG 124 12/20/2014   CHOLHDL 3.2 12/20/2014   She has been working on diet and exercise for diabetes with CKD, she is off ARB due to hypotension, she is off the metformin and is diet controlled, sugars have been monitored very closely by her daughter, and denies paresthesia of the feet, polydipsia, polyuria and visual disturbances. Last A1C in the office was:  Lab Results  Component Value Date   HGBA1C 6.3 (H) 10/31/2016   Patient is on Vitamin D supplement. Lab Results  Component Value Date   VD25OH 45 09/15/2014     Patient lives with her daughter, denies any falls in the past year.  She complains of chronic pain in bilateral knees and follows with Dr. Heron Nayaldroff, takes tramadol PRN, she takes rarely, had 60 filled in Dec and still has some left. Walks with walker/cane.  She has dementia and her daughter provides the majority of the history.  She has a history of GERD.  She is on thyroid medication. Her medication was not changed last visit.   Lab Results  Component Value Date   TSH 4.59 (H) 10/31/2016  .   Medication Review Current Outpatient Medications on File Prior to Visit  Medication Sig Dispense Refill  . aspirin EC 81  MG tablet Take 81 mg by mouth daily.    . Black Currant Seed Oil 500 MG CAPS Take 1 capsule by mouth 2 (two) times daily.    . cetirizine (ZYRTEC) 10 MG tablet Take 10 mg by mouth daily.    . clotrimazole-betamethasone (LOTRISONE) cream Apply to affected area 2 times daily 15 g 1  . docusate sodium (COLACE) 100 MG capsule Take 100-200 mg by mouth 2 (two)  times daily. Takes 200 mg in the morning and 100 mg at night    . levothyroxine (SYNTHROID, LEVOTHROID) 50 MCG tablet TAKE 1 TABLET BY MOUTH  DAILY 90 tablet 1  . Multiple Vitamin (MULTI-VITAMIN DAILY PO) Take by mouth.    . Polyethylene Glycol 3350 (MIRALAX PO) Take by mouth.    . silver sulfADIAZINE (SILVADENE) 1 % cream Apply 1 application topically daily. 50 g 0   No current facility-administered medications on file prior to visit.     Current Problems (verified) Patient Active Problem List   Diagnosis Date Noted  . Alzheimer disease 12/11/2016  . Encounter for Medicare annual wellness exam 09/08/2014  . Knee pain, bilateral 06/14/2014  . Hypothyroidism 02/23/2014  . Medication management 08/20/2013  . Vitamin D deficiency 02/19/2013  . Seizure (HCC) 06/13/2012  . Hepatitis C 06/10/2012  . Normocytic anemia 06/10/2012  . T2_NIDDM Marikay Alar 06/10/2012  . Mixed hyperlipidemia 06/10/2012  . Essential hypertension 06/10/2012    Screening Tests Immunization History  Administered Date(s) Administered  . Influenza Whole 10/29/2012  . Influenza, High Dose Seasonal PF 11/23/2013, 12/20/2014, 11/26/2016  . Pneumococcal Conjugate-13 04/30/2016  . Pneumococcal Polysaccharide-23 07/15/2008    Preventative care:  Last colonoscopy: declines Last mammogram: declines Last pap smear/pelvic exam: declines   DEXA:declines  Prior vaccinations: TD or Tdap: declines  Influenza: 2017 Pneumococcal: 2010 Prevnar 13: declines Shingles/Zostavax: declines.   Names of Other Physician/Practitioners you currently use: 1. [facility]- here for primary care Patient Care Team: Lucky Cowboy, MD as PCP - General (Internal Medicine) Sallye Lat, MD as Consulting Physician (Optometry) Sharrell Ku, MD as Consulting Physician (Gastroenterology) Marcene Corning, MD as Consulting Physician (Orthopedic Surgery) Brooke Dare, MD as Consulting Physician  (Gastroenterology) Quentin Mulling, PA-C as Referring Physician (Physician Assistant)  Allergies No Known Allergies  SURGICAL HISTORY She  has a past surgical history that includes Cataract extraction (Right). FAMILY HISTORY Her family history includes Dementia in her brother, sister, sister, sister, and sister; Heart attack in her father and mother; Hypertension in her father and mother. SOCIAL HISTORY She  reports that  has never smoked. She has quit using smokeless tobacco. Her smokeless tobacco use included snuff. She reports that she does not drink alcohol or use drugs.   Objective:   There were no vitals taken for this visit. There is no height or weight on file to calculate BMI.  General appearance: alert, no distress, WD/WN,  female HEENT: normocephalic, sclerae anicteric, TMs pearly, nares patent, no discharge or erythema, pharynx normal Oral cavity: MMM, no lesions Neck: supple, no lymphadenopathy, no thyromegaly, no masses Heart: RRR, normal S1, S2 Lungs: rhonchi  LLL.  no wheezes, rhonchi, or rales Abdomen: +bs, soft, non tender, non distended, no masses, no hepatomegaly, no splenomegaly Musculoskeletal: nontender, no swelling, no obvious deformity Extremities: no edema, no cyanosis, no clubbing Pulses: 2+ symmetric, upper and lower extremities, normal cap refill Neurological: alert, oriented x 2, CN2-12 intact, strength decreased upper extremities and lower extremities, sensation decreased throughout bilateral feet, unsteady gait, walks with  walker Psychiatric: normal affect, behavior normal, pleasant  Breast: defer Gyn: defer Rectal: defer  EKG: declines due to age   Quentin Mulling, [place]   04/29/2017

## 2017-05-01 ENCOUNTER — Encounter: Payer: Self-pay | Admitting: Physician Assistant

## 2017-05-08 ENCOUNTER — Other Ambulatory Visit: Payer: Self-pay | Admitting: Physician Assistant

## 2017-05-10 ENCOUNTER — Encounter (HOSPITAL_COMMUNITY): Payer: Self-pay | Admitting: Emergency Medicine

## 2017-05-10 ENCOUNTER — Telehealth: Payer: Self-pay | Admitting: Internal Medicine

## 2017-05-10 ENCOUNTER — Ambulatory Visit (HOSPITAL_COMMUNITY)
Admission: EM | Admit: 2017-05-10 | Discharge: 2017-05-10 | Disposition: A | Discharge status: Home / Self Care | Payer: Medicare Other

## 2017-05-10 NOTE — ED Notes (Signed)
Spoke with Dr. Hyacinth MeekerMiller and Amy, pt needs to be evaluated in the ER, spoke with family, family will take her to the ER.

## 2017-05-10 NOTE — ED Triage Notes (Signed)
Per family member, for the last several days pt has been "talking out of her head", tried to get out of bed last night and fell backwards and landed on her back. Denies hitting head. Denies LOC. Pt denies pain. States early this morning she said she had some back pain and was given medicine for it and she denies pain now. Hx of alzheimers.

## 2017-05-10 NOTE — Telephone Encounter (Signed)
Patient's family called office and  states that [name] fell Thursday night in her home. They found her on the floor on her back. They state,  "She Has been up all night pacing and talking about the dead." States She Has been disoriented, confused, agitated, c/o pain in her side, no bruising, and having hallucinations about the dead. Family concerned she may have had a small stroke. Per Dr Lucky CowboyWilliam McKeown, go directly to the Emergency Room for evaluation. Patient's family states that she [facility]. Stressed the critical time frame of her fall and symptoms, ER highly recommended. Family declined, [facility].

## 2017-05-14 ENCOUNTER — Ambulatory Visit
Admission: RE | Admit: 2017-05-14 | Discharge: 2017-05-14 | Disposition: A | Discharge status: Home / Self Care | Payer: Medicare Other | Source: Ambulatory Visit | Attending: Physician Assistant | Admitting: Physician Assistant

## 2017-05-14 ENCOUNTER — Encounter: Payer: Self-pay | Admitting: Physician Assistant

## 2017-05-14 ENCOUNTER — Ambulatory Visit (INDEPENDENT_AMBULATORY_CARE_PROVIDER_SITE_OTHER): Payer: Medicare Other | Admitting: Physician Assistant

## 2017-05-14 VITALS — BP 132/98 | HR 104 | Temp 97.3°F | Resp 14 | Ht 63.0 in | Wt 125.6 lb

## 2017-05-14 DIAGNOSIS — S0990XA Unspecified injury of head, initial encounter: Secondary | ICD-10-CM | POA: Diagnosis not present

## 2017-05-14 DIAGNOSIS — E039 Hypothyroidism, unspecified: Secondary | ICD-10-CM | POA: Diagnosis not present

## 2017-05-14 DIAGNOSIS — J42 Unspecified chronic bronchitis: Secondary | ICD-10-CM | POA: Diagnosis not present

## 2017-05-14 DIAGNOSIS — R410 Disorientation, unspecified: Secondary | ICD-10-CM | POA: Diagnosis not present

## 2017-05-14 DIAGNOSIS — R443 Hallucinations, unspecified: Secondary | ICD-10-CM

## 2017-05-14 DIAGNOSIS — R350 Frequency of micturition: Secondary | ICD-10-CM

## 2017-05-14 NOTE — Patient Instructions (Signed)
Vascular Dementia Dementia is a condition in which a person has problems with thinking, memory, and behavior that are severe enough to interfere with daily life. Vascular dementia is a type of dementia. It results from brain damage that is caused by the brain not getting enough blood. Vascular dementia usually begins between 60 and 82 years of age. What are the causes? Vascular dementia is caused by conditions that lessen blood flow to the brain. Common causes include:  Multiple small strokes. These may happen without symptoms (silent stroke).  Major stroke.  Damage to small blood vessels in the brain (cerebral small vessel disease).  What increases the risk?  Advancing age.  Having had a stroke.  Having high blood pressure (hypertension) or high cholesterol.  Having a disease that affects the heart or blood vessels.  Smoking.  Having diabetes.  Being female.  Being obese.  Not being active.  Having depression. What are the signs or symptoms? Symptoms can vary a lot from one person to another. Symptoms may be mild or severe depending on the amount of damage and which parts of the brain have been affected. Symptoms may begin suddenly or may develop gradually. Symptoms may remain stable, or they may get worse over time. Symptoms of vascular dementia may be similar to those of Alzheimer disease. The two conditions can occur together (mixed dementia). Symptoms of vascular dementia may include: Mental  Confusion.  Memory problems.  Poor attention and concentration.  Trouble understanding speech.  Depression.  Personality changes.  Trouble recognizing familiar people.  Agitation or aggression.  Paranoia.  Delusions or hallucinations. Physical  Weakness.  Poor balance.  Loss of bladder or bowel control (incontinence).  Unsteady walking (gait).  Speaking problems. Behavioral  Getting lost in familiar places.  Problems with planning and  judgment.  Trouble following instructions.  Social problems.  Emotional outbursts.  Trouble with daily activities and self-care.  Problems handling money. How is this diagnosed? There is not a specific test to diagnose vascular dementia. The health care provider will consider the person's medical history and symptoms or changes that are reported by friends and family. The health care provider will do a physical exam and may order lab tests or other tests that check brain and nervous system function. Tests that may be done include:  Blood tests.  Brain imaging tests.  Tests of movement, speech, and other daily activities (neurological exam).  Tests of memory, thinking, and problem-solving (neuropsychological or neurocognitive testing).  Diagnosis may involve several specialists. These may include a health care provider who specializes in the brain and nervous system (neurologist), a provider who specializes in disorders of the mind (psychiatrist), and a provider who focuses on speech and language changes (speech pathologist). How is this treated? There is no cure for vascular dementia. Brain damage that has already occurred cannot be reversed. Treatment depends on:  How severe the condition is.  Which parts of the brain have been affected.  The person's overall health.  Treatment measures aim to:  Treat the underlying cause of vascular dementia and manage risk factors. This may include: ? Controlling blood pressure. ? Lowering cholesterol. ? Treating diabetes. ? Quitting smoking. ? Losing weight.  Manage symptoms.  Prevent further brain damage.  Improve the person's health and quality of life.  Treatment for dementia may involve a team of health care providers, including:  A neurologist.  A psychiatrist.  An occupational therapist.  A speech pathologist.  A cardiologist.  An exercise physiologist or physical   therapist.  Follow these instructions at  home: Home care for a person with vascular dementia depends on what caused the condition and how severe the symptoms are. General guidelines for care at home include:  Following the health care provider's instructions for treating the condition that caused the dementia.  Using medicines only as told by the person's health care provider.  Creating a safe living space.  Learning ways to help the person remember people, appointments, and daily activities.  Finding a support group to help caregivers and family to cope with the effects of dementia.  Helping family and friends learn about ways to communicate with a person who has dementia.  Making sure the person keeps all follow-up visits and goes to all rehabilitation appointments as told by the health care team. This is important.  Contact a health care provider if:  A fever develops.  New behavioral problems develop.  Problems with swallowing develop.  Confusion gets worse.  Sleepiness gets worse. Get help right away if:  Loss of consciousness occurs.  There is a sudden loss of speech, balance, or thinking ability.  New numbness or paralysis occurs.  Sudden, severe headache occurs.  Vision is lost or suddenly gets worse in one or both eyes. This information is not intended to replace advice given to you by your health care provider. Make sure you discuss any questions you have with your health care provider. Document Released: 01/19/2002 Document Revised: 07/07/2015 Document Reviewed: 05/12/2014 Elsevier Interactive Patient Education  2018 [employer]  

## 2017-05-14 NOTE — Progress Notes (Signed)
Subjective:    Patient ID: [name], female    DOB: [birth year], 84 y.o.   MRN: [id number]  HPI 84 y.o. [name] with history of Hepatitis C; Normocytic anemia; T2_NIDDM /CKD3; Mixed hyperlipidemia; Essential hypertension; Seizure (HCC); Vitamin D deficiency; Medication management; Hypothyroidism; Knee pain, bilateral;  and Alzheimer presents s/p fall.  She went to UC for fall on 05/10/2017 BUT she was told to go to the ER but they did not so she presents today.   Daughter states she had imbalance all day, some slurred speech Friday, increased urination, then they heard her fall, they all ran into the room, she was flat on her back, no known LOC.    Her daughter is here, states that she has been hallucinating, talking to dead relatives, staying up all night. Daughter states at one time her speech was slurred, and she has been more tired, and having drooling- all on Friday before the fall. She states Saturday her mom was normal, then yesterday was worse. Has had worsening incontinence.   Lab Results  Component Value Date   TSH 4.59 (H) 10/31/2016   BMI is Body mass index is 22.25 kg/m., she is working on diet and exercise. Wt Readings from Last 3 Encounters:  05/14/17 125 lb 9.6 oz (57 kg)  12/11/16 122 lb 8 oz (55.6 kg)  10/31/16 121 lb (54.9 kg)    Blood pressure (!) 132/98, pulse (!) 104, temperature (!) 97.3 F (36.3 C), resp. rate 14, height 5\' 3"  (1.6 m), weight 125 lb 9.6 oz (57 kg), SpO2 97 %. ' Medications Current Outpatient Medications on File Prior to Visit  Medication Sig  . aspirin EC 81 MG tablet Take 81 mg by mouth daily.  . Black Currant Seed Oil 500 MG CAPS Take 1 capsule by mouth 2 (two) times daily.  . cetirizine (ZYRTEC) 10 MG tablet Take 10 mg by mouth daily.  . clotrimazole-betamethasone (LOTRISONE) cream Apply to affected area 2 times daily  . levothyroxine (SYNTHROID, LEVOTHROID) 50 MCG tablet TAKE 1 TABLET BY MOUTH  DAILY  . Multiple Vitamin  (MULTI-VITAMIN DAILY PO) Take by mouth.  . Polyethylene Glycol 3350 (MIRALAX PO) Take by mouth.  . silver sulfADIAZINE (SILVADENE) 1 % cream Apply 1 application topically daily.  . silver sulfADIAZINE (SILVADENE) 1 % cream APPLY 1 APPLICATION TOPICALLY DAILY.   No current facility-administered medications on file prior to visit.     Problem list She has Hepatitis C; Normocytic anemia; T2_NIDDM /CKD3; Mixed hyperlipidemia; Essential hypertension; Seizure (HCC); Vitamin D deficiency; Medication management; Hypothyroidism; Knee pain, bilateral; Encounter for Medicare annual wellness exam; and Alzheimer disease on their problem list.   Review of Systems See HPI    Objective:   Physical Exam  Constitutional: No distress.  HENT:  Head: Normocephalic.  Eyes: Pupils are equal, round, and reactive to light. Conjunctivae are normal.  Neck: Neck supple.  Cardiovascular: Normal pulses. A regularly irregular rhythm present.  Extrasystoles are present.  Murmur heard.  Systolic murmur is present. Pulmonary/Chest: Effort normal. She has wheezes (or rhonchi bilateral lungs, left worse than right).  Abdominal: Soft. Bowel sounds are normal. There is no tenderness.  Neurological: She is alert. No cranial nerve deficit.  She is oriented to self, does not know her birthday.   Skin: Skin is warm and dry. No rash noted. She is not diaphoretic.        Assessment & Plan:   Hallucination Rule out infection- get CBC, UA and CXR Get CT  head rule out SDH- increase sleeping, confusion, recent fall EKG WNL- no ST changes May be from dementia and need medications -     CBC with Differential/Platelet -     BASIC METABOLIC PANEL WITH GFR -     Hepatic function panel -     TSH -     Urinalysis, Routine w reflex microscopic -     Urine Culture -     DG Chest 2 View; Future -     CT Head Wo Contrast; Future -     EKG 12-Lead  Urinary frequency -     Urinalysis, Routine w reflex microscopic -      Urine Culture  Hypothyroidism, unspecified type -     TSH

## 2017-05-15 ENCOUNTER — Telehealth: Payer: Self-pay | Admitting: Physician Assistant

## 2017-05-15 ENCOUNTER — Encounter (HOSPITAL_COMMUNITY): Payer: Self-pay | Admitting: *Deleted

## 2017-05-15 ENCOUNTER — Other Ambulatory Visit: Payer: Self-pay

## 2017-05-15 ENCOUNTER — Observation Stay (HOSPITAL_COMMUNITY)
Admission: EM | Admit: 2017-05-15 | Discharge: 2017-05-16 | Disposition: A | Discharge status: Home / Self Care | Payer: Medicare Other | Attending: Internal Medicine | Admitting: Internal Medicine

## 2017-05-15 DIAGNOSIS — Z820 Family history of epilepsy and other diseases of the nervous system: Secondary | ICD-10-CM | POA: Diagnosis not present

## 2017-05-15 DIAGNOSIS — Z8619 Personal history of other infectious and parasitic diseases: Secondary | ICD-10-CM | POA: Insufficient documentation

## 2017-05-15 DIAGNOSIS — N183 Chronic kidney disease, stage 3 (moderate): Secondary | ICD-10-CM | POA: Diagnosis not present

## 2017-05-15 DIAGNOSIS — G309 Alzheimer's disease, unspecified: Secondary | ICD-10-CM | POA: Insufficient documentation

## 2017-05-15 DIAGNOSIS — Z79899 Other long term (current) drug therapy: Secondary | ICD-10-CM | POA: Insufficient documentation

## 2017-05-15 DIAGNOSIS — W19XXXA Unspecified fall, initial encounter: Secondary | ICD-10-CM | POA: Insufficient documentation

## 2017-05-15 DIAGNOSIS — M1990 Unspecified osteoarthritis, unspecified site: Secondary | ICD-10-CM | POA: Diagnosis not present

## 2017-05-15 DIAGNOSIS — E1122 Type 2 diabetes mellitus with diabetic chronic kidney disease: Secondary | ICD-10-CM | POA: Diagnosis not present

## 2017-05-15 DIAGNOSIS — I129 Hypertensive chronic kidney disease with stage 1 through stage 4 chronic kidney disease, or unspecified chronic kidney disease: Secondary | ICD-10-CM | POA: Insufficient documentation

## 2017-05-15 DIAGNOSIS — J189 Pneumonia, unspecified organism: Secondary | ICD-10-CM | POA: Diagnosis not present

## 2017-05-15 DIAGNOSIS — E039 Hypothyroidism, unspecified: Secondary | ICD-10-CM | POA: Diagnosis not present

## 2017-05-15 DIAGNOSIS — Z9841 Cataract extraction status, right eye: Secondary | ICD-10-CM | POA: Diagnosis not present

## 2017-05-15 DIAGNOSIS — F0280 Dementia in other diseases classified elsewhere without behavioral disturbance: Secondary | ICD-10-CM | POA: Insufficient documentation

## 2017-05-15 DIAGNOSIS — Z8249 Family history of ischemic heart disease and other diseases of the circulatory system: Secondary | ICD-10-CM | POA: Insufficient documentation

## 2017-05-15 DIAGNOSIS — Z7982 Long term (current) use of aspirin: Secondary | ICD-10-CM | POA: Diagnosis not present

## 2017-05-15 DIAGNOSIS — E441 Mild protein-calorie malnutrition: Secondary | ICD-10-CM | POA: Insufficient documentation

## 2017-05-15 DIAGNOSIS — Z794 Long term (current) use of insulin: Secondary | ICD-10-CM | POA: Diagnosis not present

## 2017-05-15 DIAGNOSIS — H409 Unspecified glaucoma: Secondary | ICD-10-CM | POA: Insufficient documentation

## 2017-05-15 DIAGNOSIS — F0281 Dementia in other diseases classified elsewhere with behavioral disturbance: Secondary | ICD-10-CM | POA: Diagnosis not present

## 2017-05-15 DIAGNOSIS — Z87891 Personal history of nicotine dependence: Secondary | ICD-10-CM | POA: Insufficient documentation

## 2017-05-15 DIAGNOSIS — D631 Anemia in chronic kidney disease: Secondary | ICD-10-CM | POA: Insufficient documentation

## 2017-05-15 DIAGNOSIS — I700 Atherosclerosis of aorta: Secondary | ICD-10-CM | POA: Diagnosis not present

## 2017-05-15 DIAGNOSIS — R569 Unspecified convulsions: Secondary | ICD-10-CM | POA: Diagnosis not present

## 2017-05-15 DIAGNOSIS — E782 Mixed hyperlipidemia: Secondary | ICD-10-CM | POA: Insufficient documentation

## 2017-05-15 DIAGNOSIS — I10 Essential (primary) hypertension: Secondary | ICD-10-CM | POA: Diagnosis not present

## 2017-05-15 DIAGNOSIS — E559 Vitamin D deficiency, unspecified: Secondary | ICD-10-CM | POA: Insufficient documentation

## 2017-05-15 DIAGNOSIS — G301 Alzheimer's disease with late onset: Secondary | ICD-10-CM | POA: Diagnosis not present

## 2017-05-15 DIAGNOSIS — R4182 Altered mental status, unspecified: Secondary | ICD-10-CM

## 2017-05-15 HISTORY — DX: Gastro-esophageal reflux disease without esophagitis: K21.9

## 2017-05-15 HISTORY — DX: Unspecified convulsions: R56.9

## 2017-05-15 HISTORY — DX: Pneumonia, unspecified organism: J18.9

## 2017-05-15 HISTORY — DX: Hypothyroidism, unspecified: E03.9

## 2017-05-15 HISTORY — DX: Major depressive disorder, single episode, unspecified: F32.9

## 2017-05-15 HISTORY — DX: Type 2 diabetes mellitus without complications: E11.9

## 2017-05-15 HISTORY — DX: Depression, unspecified: F32.A

## 2017-05-15 LAB — COMPREHENSIVE METABOLIC PANEL
ALK PHOS: 70 U/L (ref 38–126)
ALT: 29 U/L (ref 14–54)
ANION GAP: 9 (ref 5–15)
AST: 41 U/L (ref 15–41)
Albumin: 2.9 g/dL — ABNORMAL LOW (ref 3.5–5.0)
BUN: 15 mg/dL (ref 6–20)
CALCIUM: 9.2 mg/dL (ref 8.9–10.3)
CHLORIDE: 99 mmol/L — AB (ref 101–111)
CO2: 24 mmol/L (ref 22–32)
Creatinine, Ser: 0.84 mg/dL (ref 0.44–1.00)
GFR calc non Af Amer: 58 mL/min — ABNORMAL LOW (ref >60)
Glucose, Bld: 356 mg/dL — ABNORMAL HIGH (ref 65–99)
Potassium: 4 mmol/L (ref 3.5–5.1)
SODIUM: 132 mmol/L — AB (ref 135–145)
Total Bilirubin: 0.6 mg/dL (ref 0.3–1.2)
Total Protein: 7.5 g/dL (ref 6.5–8.1)

## 2017-05-15 LAB — BASIC METABOLIC PANEL WITH GFR
BUN / CREAT RATIO: 18 (calc) (ref 6–22)
BUN: 16 mg/dL (ref 7–25)
CO2: 31 mmol/L (ref 20–32)
Calcium: 9.7 mg/dL (ref 8.6–10.4)
Chloride: 99 mmol/L (ref 98–110)
Creat: 0.9 mg/dL — ABNORMAL HIGH (ref 0.60–0.88)
GFR, EST AFRICAN AMERICAN: 64 mL/min/{1.73_m2} (ref >=60)
GFR, EST NON AFRICAN AMERICAN: 55 mL/min/{1.73_m2} — AB (ref >=60)
Glucose, Bld: 352 mg/dL — ABNORMAL HIGH (ref 65–99)
POTASSIUM: 4.8 mmol/L (ref 3.5–5.3)
SODIUM: 137 mmol/L (ref 135–146)

## 2017-05-15 LAB — HEPATIC FUNCTION PANEL
AG Ratio: 0.7 (calc) — ABNORMAL LOW (ref 1.0–2.5)
ALBUMIN MSPROF: 3.4 g/dL — AB (ref 3.6–5.1)
ALKALINE PHOSPHATASE (APISO): 83 U/L (ref 33–130)
ALT: 29 U/L (ref 6–29)
AST: 40 U/L — AB (ref 10–35)
BILIRUBIN DIRECT: 0.2 mg/dL (ref 0.0–0.2)
BILIRUBIN INDIRECT: 0.4 mg/dL (ref 0.2–1.2)
BILIRUBIN TOTAL: 0.6 mg/dL (ref 0.2–1.2)
Globulin: 4.8 g/dL (calc) — ABNORMAL HIGH (ref 1.9–3.7)
Total Protein: 8.2 g/dL — ABNORMAL HIGH (ref 6.1–8.1)

## 2017-05-15 LAB — CBC WITH DIFFERENTIAL/PLATELET
BASOS ABS: 27 {cells}/uL (ref 0–200)
BASOS PCT: 0 %
Basophils Absolute: 0 10*3/uL (ref 0.0–0.1)
Basophils Relative: 0.4 %
EOS ABS: 0.4 10*3/uL (ref 0.0–0.7)
Eosinophils Absolute: 238 cells/uL (ref 15–500)
Eosinophils Relative: 3.5 %
Eosinophils Relative: 7 %
HCT: 36 % (ref 35.0–45.0)
HCT: 36 % (ref 36.0–46.0)
HEMOGLOBIN: 11.9 g/dL — AB (ref 12.0–15.0)
Hemoglobin: 12.2 g/dL (ref 11.7–15.5)
Lymphocytes Relative: 40 %
Lymphs Abs: 2829 cells/uL (ref 850–3900)
Lymphs Abs: 2.3 10*3/uL (ref 0.7–4.0)
MCH: 32.5 pg (ref 27.0–33.0)
MCH: 32.2 pg (ref 26.0–34.0)
MCHC: 33.9 g/dL (ref 32.0–36.0)
MCHC: 33.1 g/dL (ref 30.0–36.0)
MCV: 96 fL (ref 80.0–100.0)
MCV: 97.6 fL (ref 78.0–100.0)
MPV: 10.6 fL (ref 7.5–12.5)
Monocytes Absolute: 0.5 10*3/uL (ref 0.1–1.0)
Monocytes Relative: 9.7 %
Monocytes Relative: 9 %
NEUTROS PCT: 44.8 %
NEUTROS PCT: 44 %
Neutro Abs: 3046 cells/uL (ref 1500–7800)
Neutro Abs: 2.6 10*3/uL (ref 1.7–7.7)
PLATELETS: 158 10*3/uL (ref 150–400)
Platelets: 175 10*3/uL (ref 140–400)
RBC: 3.75 10*6/uL — ABNORMAL LOW (ref 3.80–5.10)
RBC: 3.69 MIL/uL — AB (ref 3.87–5.11)
RDW: 11.9 % (ref 11.0–15.0)
RDW: 12.7 % (ref 11.5–15.5)
TOTAL LYMPHOCYTE: 41.6 %
WBC: 6.8 10*3/uL (ref 3.8–10.8)
WBC: 5.8 10*3/uL (ref 4.0–10.5)
WBCMIX: 660 {cells}/uL (ref 200–950)

## 2017-05-15 LAB — URINALYSIS, ROUTINE W REFLEX MICROSCOPIC
BILIRUBIN URINE: NEGATIVE
Bacteria, UA: NONE SEEN /HPF
HGB URINE DIPSTICK: NEGATIVE
Hyaline Cast: NONE SEEN /LPF
KETONES UR: NEGATIVE
NITRITE: NEGATIVE
PROTEIN: NEGATIVE
RBC / HPF: NONE SEEN /HPF (ref 0–2)
SQUAMOUS EPITHELIAL / LPF: NONE SEEN /HPF (ref <=5)
Specific Gravity, Urine: 1.014 (ref 1.001–1.03)
pH: 6.5 (ref 5.0–8.0)

## 2017-05-15 LAB — TSH: TSH: 4.62 mIU/L — ABNORMAL HIGH (ref 0.40–4.50)

## 2017-05-15 LAB — URINE CULTURE
MICRO NUMBER: 90407710
RESULT: NO GROWTH
SPECIMEN QUALITY:: ADEQUATE

## 2017-05-15 LAB — STREP PNEUMONIAE URINARY ANTIGEN: Strep Pneumo Urinary Antigen: NEGATIVE

## 2017-05-15 LAB — GLUCOSE, CAPILLARY: GLUCOSE-CAPILLARY: 138 mg/dL — AB (ref 65–99)

## 2017-05-15 MED ORDER — DIPHENHYDRAMINE HCL 12.5 MG/5ML PO ELIX
12.5000 mg | ORAL_SOLUTION | Freq: Once | ORAL | Status: AC
  Administered 2017-05-15: 12.5 mg via ORAL
  Filled 2017-05-15: qty 10

## 2017-05-15 MED ORDER — BLACK CURRANT SEED OIL 500 MG PO CAPS: 1.0000 | ORAL_CAPSULE | Freq: Two times a day (BID) | ORAL | Status: DC

## 2017-05-15 MED ORDER — DOXYCYCLINE HYCLATE 100 MG PO TABS
100.0000 mg | ORAL_TABLET | Freq: Once | ORAL | Status: AC
Start: 2017-05-15 — End: 2017-05-15
  Administered 2017-05-15: 100 mg via ORAL
  Filled 2017-05-15: qty 1

## 2017-05-15 MED ORDER — INSULIN ASPART 100 UNIT/ML ~~LOC~~ SOLN
0.0000 [IU] | Freq: Three times a day (TID) | SUBCUTANEOUS | Status: DC
  Administered 2017-05-16: 3 [IU] via SUBCUTANEOUS
  Administered 2017-05-16: 1 [IU] via SUBCUTANEOUS

## 2017-05-15 MED ORDER — SODIUM CHLORIDE 0.9% FLUSH: 3.0000 mL | INTRAVENOUS | Status: DC | PRN

## 2017-05-15 MED ORDER — SODIUM CHLORIDE 0.9 % IV SOLN: 250.0000 mL | INTRAVENOUS | Status: DC | PRN

## 2017-05-15 MED ORDER — LEVOTHYROXINE SODIUM 50 MCG PO TABS
50.0000 ug | ORAL_TABLET | Freq: Every day | ORAL | Status: DC
  Administered 2017-05-16: 50 ug via ORAL
  Filled 2017-05-15: qty 1

## 2017-05-15 MED ORDER — SODIUM CHLORIDE 0.9 % IV SOLN
1.0000 g | Freq: Once | INTRAVENOUS | Status: AC
  Administered 2017-05-15: 1 g via INTRAVENOUS
  Filled 2017-05-15: qty 10

## 2017-05-15 MED ORDER — SODIUM CHLORIDE 0.9% FLUSH
3.0000 mL | Freq: Two times a day (BID) | INTRAVENOUS | Status: DC
  Administered 2017-05-15 – 2017-05-16 (×2): 3 mL via INTRAVENOUS

## 2017-05-15 MED ORDER — DOXYCYCLINE HYCLATE 100 MG PO TABS
100.0000 mg | ORAL_TABLET | Freq: Two times a day (BID) | ORAL | Status: DC
  Administered 2017-05-15 – 2017-05-16 (×2): 100 mg via ORAL
  Filled 2017-05-15 (×2): qty 1

## 2017-05-15 MED ORDER — SODIUM CHLORIDE 0.9 % IV SOLN
1.0000 g | INTRAVENOUS | Status: DC
  Administered 2017-05-16: 1 g via INTRAVENOUS
  Filled 2017-05-15: qty 10

## 2017-05-15 MED ORDER — ASPIRIN EC 81 MG PO TBEC
81.0000 mg | DELAYED_RELEASE_TABLET | Freq: Every day | ORAL | Status: DC
  Administered 2017-05-15 – 2017-05-16 (×2): 81 mg via ORAL
  Filled 2017-05-15 (×2): qty 1

## 2017-05-15 NOTE — ED Triage Notes (Signed)
Pt in from her PCP, chest xray positive for bilateral pneumonia, pt reports a cough, denies fever, family reports normal appetite

## 2017-05-15 NOTE — ED Notes (Signed)
Admitting at bedside 

## 2017-05-15 NOTE — ED Notes (Signed)
Attempted report x1. 

## 2017-05-15 NOTE — H&P (Signed)
History and Physical    [name] ION:[id number] DOB: May 02, 1922 DOA: 05/15/2017  PCP: Lucky Cowboy, MD  Patient coming from: Home  Chief Complaint: Has pneumonia  HPI: [name] is a 84 y.o. female with medical history significant of Alzheimer's dementia, diabetes, hypertension was seen by primary care physician yesterday and was sent to the emergency department because she has bilateral pneumonia.  Patient has not been recently hospitalized.  She is not recently been on any oral antibiotics.  She is eating and drinking normally.  Family denies any fevers.  She does have a mild cough.  Apparently the PCP advised family to come to the emergency department for hospitalization as she requires IV antibiotics for her pneumonia.  Patient has been given Rocephin and doxycycline in the emergency department.  She has been referred for admission for pneumonia.  She has had no nausea vomiting or diarrhea.  She has been a little weaker than normal but she is still ambulating.  Her mental status has been normal for her dementia according to family members.   Review of Systems: As per HPI otherwise 10 point review of systems negative per daughter  Past Medical History:  Diagnosis Date  . Allergy   . Alzheimer disease 12/11/2016  . Arthritis   . Diabetes mellitus without complication (HCC)   . DJD (degenerative joint disease)   . Glaucoma   . Hepatitis C   . Hyperlipidemia   . Hypertension   . Thyroid disease   . Vitamin D deficiency     Past Surgical History:  Procedure Laterality Date  . CATARACT EXTRACTION Right      reports that she has never smoked. She has quit using smokeless tobacco. Her smokeless tobacco use included snuff. She reports that she does not drink alcohol or use drugs.  No Known Allergies  Family History  Problem Relation Age of Onset  . Hypertension Mother   . Heart attack Mother   . Hypertension Father   . Heart attack Father   . Dementia Sister    . Dementia Sister   . Dementia Sister   . Dementia Sister   . Dementia Brother     Prior to Admission medications   Medication Sig Start Date End Date Taking? Authorizing Provider  aspirin EC 81 MG tablet Take 81 mg by mouth daily.    [provider]  Black Currant Seed Oil 500 MG CAPS Take 1 capsule by mouth 2 (two) times daily.    [provider]  cetirizine (ZYRTEC) 10 MG tablet Take 10 mg by mouth daily.    [provider]  clotrimazole-betamethasone (LOTRISONE) cream Apply to affected area 2 times daily 11/20/16 11/20/17  Quentin Mulling, PA-C  levothyroxine (SYNTHROID, LEVOTHROID) 50 MCG tablet TAKE 1 TABLET BY MOUTH  DAILY 02/05/17   Lucky Cowboy, MD  Multiple Vitamin (MULTI-VITAMIN DAILY PO) Take by mouth.    [provider]  Polyethylene Glycol 3350 (MIRALAX PO) Take by mouth.    [provider]  silver sulfADIAZINE (SILVADENE) 1 % cream APPLY 1 APPLICATION TOPICALLY DAILY. 05/08/17   Quentin Mulling, PA-C    Physical Exam: Vitals:   05/15/17 1432 05/15/17 1651  BP: 124/66 (!) 142/63  Pulse: 93 64  Resp: 20 14  Temp: 98.2 F (36.8 C)   TempSrc: Oral   SpO2: 97% 98%  Weight: 56.7 kg (125 lb)   Height: 5\' 3"  (1.6 m)       Constitutional: NAD, calm, comfortable and pleasantly  demented Vitals:   05/15/17 1432 05/15/17 1651  BP: 124/66 (!) 142/63  Pulse: 93 64  Resp: 20 14  Temp: 98.2 F (36.8 C)   TempSrc: Oral   SpO2: 97% 98%  Weight: 56.7 kg (125 lb)   Height: 5\' 3"  (1.6 m)    Eyes: PERRL, lids and conjunctivae normal ENMT: Mucous membranes are moist. Posterior pharynx clear of any exudate or lesions.Normal dentition.  Neck: normal, supple, no masses, no thyromegaly Respiratory: clear to auscultation bilaterally, no wheezing, no crackles. Normal respiratory effort. No accessory muscle use.  Cardiovascular: Regular rate and rhythm, no murmurs / rubs / gallops. No extremity edema. 2+ pedal pulses. No carotid  bruits.  Abdomen: no tenderness, no masses palpated. No hepatosplenomegaly. Bowel sounds positive.  Musculoskeletal: no clubbing / cyanosis. No joint deformity upper and lower extremities. Good ROM, no contractures. Normal muscle tone.  Skin: no rashes, lesions, ulcers. No induration Neurologic: CN 2-12 grossly intact. Sensation intact, DTR normal. Strength 5/5 in all 4.  Psychiatric: No agitation  Labs on Admission: I have personally reviewed following labs and imaging studies  CBC: Recent Labs  Lab 05/14/17 1439 05/15/17 1450  WBC 6.8 5.8  NEUTROABS 3,046 2.6  HGB 12.2 11.9*  HCT 36.0 36.0  MCV 96.0 97.6  PLT 175 158   Basic Metabolic Panel: Recent Labs  Lab 05/14/17 1439 05/15/17 1450  NA 137 132*  K 4.8 4.0  CL 99 99*  CO2 31 24  GLUCOSE 352* 356*  BUN 16 15  CREATININE 0.90* 0.84  CALCIUM 9.7 9.2   GFR: Estimated Creatinine Clearance: 34.6 mL/min (by C-G formula based on SCr of 0.84 mg/dL). Liver Function Tests: Recent Labs  Lab 05/14/17 1439 05/15/17 1450  AST 40* 41  ALT 29 29  ALKPHOS  --  70  BILITOT 0.6 0.6  PROT 8.2* 7.5  ALBUMIN  --  2.9*   No results for input(s): LIPASE, AMYLASE in the last 168 hours. No results for input(s): AMMONIA in the last 168 hours. Coagulation Profile: No results for input(s): INR, PROTIME in the last 168 hours. Cardiac Enzymes: No results for input(s): CKTOTAL, CKMB, CKMBINDEX, TROPONINI in the last 168 hours. BNP (last 3 results) No results for input(s): PROBNP in the last 8760 hours. HbA1C: No results for input(s): HGBA1C in the last 72 hours. CBG: No results for input(s): GLUCAP in the last 168 hours. Lipid Profile: No results for input(s): CHOL, HDL, LDLCALC, TRIG, CHOLHDL, LDLDIRECT in the last 72 hours. Thyroid Function Tests: Recent Labs    05/14/17 1439  TSH 4.62*   Anemia Panel: No results for input(s): VITAMINB12, FOLATE, FERRITIN, TIBC, IRON, RETICCTPCT in the last 72 hours. Urine analysis:      Component Value Date/Time   COLORURINE YELLOW 05/14/2017 1439   APPEARANCEUR CLEAR 05/14/2017 1439   LABSPEC 1.014 05/14/2017 1439   PHURINE 6.5 05/14/2017 1439   GLUCOSEU 2+ (A) 05/14/2017 1439   HGBUR NEGATIVE 05/14/2017 1439   BILIRUBINUR NEGATIVE 10/13/2016 2112   KETONESUR NEGATIVE 05/14/2017 1439   PROTEINUR NEGATIVE 05/14/2017 1439   UROBILINOGEN 1 06/14/2014 1528   NITRITE NEGATIVE 05/14/2017 1439   LEUKOCYTESUR 1+ (A) 05/14/2017 1439   Sepsis Labs: !!!!!!!!!!!!!!!!!!!!!!!!!!!!!!!!!!!!!!!!!!!! @LABRCNTIP (procalcitonin:4,lacticidven:4) )No results found for this or any previous visit (from the past 240 hour(s)).   Radiological Exams on Admission: Dg Chest 2 View  Result Date: 05/15/2017 CLINICAL DATA:  Abnormal breath sounds., mental status changes. EXAM: CHEST - 2 VIEW COMPARISON:  PA and lateral chest x-ray of  October 13, 2016 FINDINGS: The lungs are adequately inflated. The interstitial markings are coarse and more conspicuous overall. Areas of patchy near confluence are noted in the upper lobes and in the right infrahilar region. The heart and pulmonary vascularity are normal. There is calcification in the wall of the thoracic aorta. There is no pleural effusion. The bony thorax is unremarkable. IMPRESSION: Chronic bronchitic changes. Probable superimposed interstitial pneumonia bilaterally. No CHF. Followup PA and lateral chest X-ray is recommended in 3-4 weeks following trial of antibiotic therapy to ensure resolution and exclude underlying malignancy. Thoracic aortic atherosclerosis. Electronically Signed   By: Caliyah Sieh  [place] M.D.   On: 05/15/2017 09:08   Ct Head Wo Contrast  Result Date: 05/14/2017 CLINICAL DATA:  Larey Seat 05/10/17 Some slurring of speech confusion unsteady gait increased lethargy R/o SDH EXAM: CT HEAD WITHOUT CONTRAST TECHNIQUE: Contiguous axial images were obtained from the base of the skull through the vertex without intravenous contrast. COMPARISON:  Head CT 5  2 14, brain MRI 06/14/2012 FINDINGS: Brain: No acute intracranial hemorrhage. No focal mass lesion. No CT evidence of acute infarction. No midline shift or mass effect. No hydrocephalus. Basilar cisterns are patent. There are periventricular and subcortical white matter hypodensities. Generalized cortical atrophy. Vascular: No hyperdense vessel or unexpected calcification. Skull: Normal. Negative for fracture or focal lesion. Sinuses/Orbits: Paranasal sinuses and mastoid air cells are clear. Orbits are clear. Other: None. IMPRESSION: 1. No acute intracranial findings. 2. Atrophy and white matter microvascular disease Electronically Signed   By: Genevive Bi M.D.   On: 05/14/2017 16:52    Old chart reviewed Chest x-ray reviewed possible interstitial pneumonia mild Case discussed with Dr. Clarene Duke in the ED  Assessment/Plan 84 year old female with moderate dementia comes in with community-acquired pneumonia Principal Problem:   PNA (pneumonia)-cover for community acquired pneumonia with Rocephin and doxycycline in a patient with O2 sats of 96% on room air.  Vitals are stable.  Observe over night on MedSurg and likely discharge tomorrow on oral antibiotics unless her vitals become unstable or she has hypoxia or not able to actually tolerate p.o..  Patient is tolerating p.o. per family members.  Patient was referred for admission by the emergency department Dr. Clarene Duke with report of not tolerating p.o.  Active Problems:   Essential hypertension-continue home meds   Seizure (HCC)-continue home meds   Alzheimer disease-noted Diabetes-placed on sliding scale insulin    Obtain physical therapy evaluation  DVT prophylaxis: SCDs Code Status: DNR Family Communication: Daughter Disposition Plan: Per day team Consults called: None Admission status: Observation   Aviv Lengacher A MD [facility]  If 7PM-7AM, please contact night-coverage [email or web address] Password [facility]  05/15/2017, 6:49 PM

## 2017-05-15 NOTE — ED Provider Notes (Signed)
Patient placed in Quick Look pathway, seen and evaluated   Chief Complaint: Pneumonia  HPI:   Pt's daughter reporting pt was evaluated by PCP yesterday with CXR showing bilateral pneumonia, which resulted today. She states her PCP instructed her to bring her mother here for possible IV abx, given patient's age. PT's daughter states sx began on Thursday night when pt began hallucinating. She states she has had similar response to pneumonia in the past. Pt brought to UC on Friday for a fall with negative workup. Per chart review, pt went to PCP yesterday, where labs, CT head, and CXR done. CT head neg. Labs without leukocytosis. CXR showing chronic bronchiitic changes with bilateral interstitial pneumonia. She states pt has had intermittent cough with clear sputum. Unknown fevers. No changes in appetite. No other complaints.  ROS: +cough  Physical Exam:   Gen: No distress  Neuro: Awake and Alert  Skin: Warm    Focused Exam: Heart sounds normal. B/l crackles in bases. No inc work of breathing.   Initiation of care has begun. The patient has been counseled on the process, plan, and necessity for staying for the completion/evaluation, and the remainder of the medical screening examination    Eltha Tingley, [place] N, PA-C 05/15/17 1443    Mabe, Latanya MaudlinMartha L, MD 05/15/17 1459

## 2017-05-15 NOTE — ED Provider Notes (Signed)
[facility] EMERGENCY DEPARTMENT Provider Note   CSN: [id number] Arrival date & time: 05/15/17  1427     History   Chief Complaint Chief Complaint  Patient presents with  . Pneumonia    HPI [name] is a 84 y.o. female.  84yo F w/ PMH including Alzheimer's dementia, type 2 diabetes mellitus, hypertension, hyperlipidemia, hepatitis C who presents with altered mental status and pneumonia.  Daughter reports that recently the patient has had increased confusion, lethargy, sleeping during the day, and seeming off balance.  She had a fall on 3/29.  She has had a mild cough.  They went to PCP yesterday for evaluation of these symptoms and PCP obtained labs as well as chest x-ray and head CT.  Head CT was negative but chest x-ray showed bilateral pneumonia and they were instructed to bring her here for further evaluation.  She has had decreased appetite but daughter is not aware of any fevers, vomiting, or diarrhea.  LEVEL 5 CAVEAT DUE TO DEMENTIA  The history is provided by a relative.  Pneumonia     Past Medical History:  Diagnosis Date  . Allergy   . Alzheimer disease 12/11/2016  . Arthritis   . Diabetes mellitus without complication (HCC)   . DJD (degenerative joint disease)   . Glaucoma   . Hepatitis C   . Hyperlipidemia   . Hypertension   . Thyroid disease   . Vitamin D deficiency     Patient Active Problem List   Diagnosis Date Noted  . Mild malnutrition (HCC) 05/15/2017  . Alzheimer disease 12/11/2016  . Encounter for Medicare annual wellness exam 09/08/2014  . Knee pain, bilateral 06/14/2014  . Hypothyroidism 02/23/2014  . Medication management 08/20/2013  . Vitamin D deficiency 02/19/2013  . Seizure (HCC) 06/13/2012  . Hepatitis C 06/10/2012  . Normocytic anemia 06/10/2012  . T2_NIDDM Marikay Alar 06/10/2012  . Mixed hyperlipidemia 06/10/2012  . Essential hypertension 06/10/2012    Past Surgical History:  Procedure Laterality Date  .  CATARACT EXTRACTION Right      OB History   None      Home Medications    Prior to Admission medications   Medication Sig Start Date End Date Taking? Authorizing Provider  aspirin EC 81 MG tablet Take 81 mg by mouth daily.    [provider]  Black Currant Seed Oil 500 MG CAPS Take 1 capsule by mouth 2 (two) times daily.    [provider]  cetirizine (ZYRTEC) 10 MG tablet Take 10 mg by mouth daily.    [provider]  clotrimazole-betamethasone (LOTRISONE) cream Apply to affected area 2 times daily 11/20/16 11/20/17  Quentin Mulling, PA-C  levothyroxine (SYNTHROID, LEVOTHROID) 50 MCG tablet TAKE 1 TABLET BY MOUTH  DAILY 02/05/17   Lucky Cowboy, MD  Multiple Vitamin (MULTI-VITAMIN DAILY PO) Take by mouth.    [provider]  Polyethylene Glycol 3350 (MIRALAX PO) Take by mouth.    [provider]  silver sulfADIAZINE (SILVADENE) 1 % cream APPLY 1 APPLICATION TOPICALLY DAILY. 05/08/17   Quentin Mulling, PA-C    Family History Family History  Problem Relation Age of Onset  . Hypertension Mother   . Heart attack Mother   . Hypertension Father   . Heart attack Father   . Dementia Sister   . Dementia Sister   . Dementia Sister   . Dementia Sister   . Dementia Brother     Social History Social History   Tobacco  Use  . Smoking status: Never Smoker  . Smokeless tobacco: Former Neurosurgeon    Types: Snuff  Substance Use Topics  . Alcohol use: No  . Drug use: No     Allergies   Patient has no known allergies.   Review of Systems Review of Systems  Unable to perform ROS: Dementia     Physical Exam Updated Vital Signs BP (!) 142/63 (BP Location: Right Arm)   Pulse 64   Temp 98.2 F (36.8 C) (Oral)   Resp 14   Ht 5\' 3"  (1.6 m)   Wt 56.7 kg (125 lb)   SpO2 98%   BMI 22.14 kg/m   Physical Exam  Constitutional: She appears well-developed.  Frail, chronically ill appearing, quiet  HENT:  Head: Normocephalic and  atraumatic.  Eyes: Pupils are equal, round, and reactive to light. Conjunctivae are normal.  Neck: Neck supple.  Cardiovascular: Normal rate and regular rhythm.  Murmur heard. Pulmonary/Chest: Effort normal. No respiratory distress.  Diminished b/l, occasional wheeze scattered b/l  Abdominal: Soft. Bowel sounds are normal. She exhibits no distension. There is no tenderness.  Musculoskeletal: She exhibits no edema.  Neurological: She is alert.  Oriented to person only, follows basic commands, sleepy  Skin: Skin is warm and dry.  Nursing note and vitals reviewed.    ED Treatments / Results  Labs (all labs ordered are listed, but only abnormal results are displayed) Labs Reviewed  CBC WITH DIFFERENTIAL/PLATELET - Abnormal; Notable for the following components:      Result Value   RBC 3.69 (*)    Hemoglobin 11.9 (*)    All other components within normal limits  COMPREHENSIVE METABOLIC PANEL - Abnormal; Notable for the following components:   Sodium 132 (*)    Chloride 99 (*)    Glucose, Bld 356 (*)    Albumin 2.9 (*)    GFR calc non Af Amer 58 (*)    All other components within normal limits    EKG None  Radiology Dg Chest 2 View  Result Date: 05/15/2017 CLINICAL DATA:  Abnormal breath sounds., mental status changes. EXAM: CHEST - 2 VIEW COMPARISON:  PA and lateral chest x-ray of October 13, 2016 FINDINGS: The lungs are adequately inflated. The interstitial markings are coarse and more conspicuous overall. Areas of patchy near confluence are noted in the upper lobes and in the right infrahilar region. The heart and pulmonary vascularity are normal. There is calcification in the wall of the thoracic aorta. There is no pleural effusion. The bony thorax is unremarkable. IMPRESSION: Chronic bronchitic changes. Probable superimposed interstitial pneumonia bilaterally. No CHF. Followup PA and lateral chest X-ray is recommended in 3-4 weeks following trial of antibiotic therapy to ensure  resolution and exclude underlying malignancy. Thoracic aortic atherosclerosis. Electronically Signed   By: David  [place] M.D.   On: 05/15/2017 09:08   Ct Head Wo Contrast  Result Date: 05/14/2017 CLINICAL DATA:  Larey Seat 05/10/17 Some slurring of speech confusion unsteady gait increased lethargy R/o SDH EXAM: CT HEAD WITHOUT CONTRAST TECHNIQUE: Contiguous axial images were obtained from the base of the skull through the vertex without intravenous contrast. COMPARISON:  Head CT 5 2 14, brain MRI 06/14/2012 FINDINGS: Brain: No acute intracranial hemorrhage. No focal mass lesion. No CT evidence of acute infarction. No midline shift or mass effect. No hydrocephalus. Basilar cisterns are patent. There are periventricular and subcortical white matter hypodensities. Generalized cortical atrophy. Vascular: No hyperdense vessel or unexpected calcification. Skull: Normal. Negative for fracture or  focal lesion. Sinuses/Orbits: Paranasal sinuses and mastoid air cells are clear. Orbits are clear. Other: None. IMPRESSION: 1. No acute intracranial findings. 2. Atrophy and white matter microvascular disease Electronically Signed   By: Genevive BiStewart  Edmunds M.D.   On: 05/14/2017 16:52    Procedures Procedures (including critical care time)  Medications Ordered in ED Medications  cefTRIAXone (ROCEPHIN) 1 g in sodium chloride 0.9 % 100 mL IVPB (has no administration in time range)  doxycycline (VIBRA-TABS) tablet 100 mg (has no administration in time range)     Initial Impression / Assessment and Plan / ED Course  I have reviewed the triage vital signs and the nursing notes.  Pertinent labs & imaging results that were available during my care of the patient were reviewed by me and considered in my medical decision making (see chart for details).     Pt non-toxic on exam w/ reassuring VS. No respiratory distress.  I reviewed her outside workup including chest x-ray which showed infiltrates. Labs today show glucose 356 w/  normal anion gap. WBC 5.8. Urine from yesterday neg for infection and urine culture pending. Added blood cultures here. I am concerned about her worsening mental status at home, confusion, and increased falls, therefore recommended inpatient treatment.  Gave ceftriaxone and doxycycline.  Discussed admission with [facility], Dr. Onalee Huaavid, and pt admitted for further care.  Final Clinical Impressions(s) / ED Diagnoses   Final diagnoses:  None    ED Discharge Orders    None       Jelina Paulsen, Ambrose Finlandachel Morgan, MD 05/16/17 [phone]

## 2017-05-15 NOTE — Telephone Encounter (Signed)
84 year old [name] presented to the office with confusion, lethargy, CT scan negative, labs normal, CXR shows bilateral pneumonia, daughter states mom still very confused and sleeping all night into the day which is unusual for her. Suggest taking to the Er for possible IV evaluation due to age and bilateral pneumonia.

## 2017-05-16 ENCOUNTER — Other Ambulatory Visit: Payer: Self-pay

## 2017-05-16 ENCOUNTER — Encounter (HOSPITAL_COMMUNITY): Payer: Self-pay | Admitting: General Practice

## 2017-05-16 DIAGNOSIS — G301 Alzheimer's disease with late onset: Secondary | ICD-10-CM

## 2017-05-16 DIAGNOSIS — J189 Pneumonia, unspecified organism: Secondary | ICD-10-CM | POA: Diagnosis not present

## 2017-05-16 DIAGNOSIS — R4182 Altered mental status, unspecified: Secondary | ICD-10-CM

## 2017-05-16 DIAGNOSIS — F0281 Dementia in other diseases classified elsewhere with behavioral disturbance: Secondary | ICD-10-CM | POA: Diagnosis not present

## 2017-05-16 DIAGNOSIS — I10 Essential (primary) hypertension: Secondary | ICD-10-CM

## 2017-05-16 LAB — CBC WITH DIFFERENTIAL/PLATELET
BASOS ABS: 0 10*3/uL (ref 0.0–0.1)
BASOS PCT: 0 %
Eosinophils Absolute: 0.5 10*3/uL (ref 0.0–0.7)
Eosinophils Relative: 8 %
HEMATOCRIT: 35.6 % — AB (ref 36.0–46.0)
HEMOGLOBIN: 12 g/dL (ref 12.0–15.0)
Lymphocytes Relative: 47 %
Lymphs Abs: 2.7 10*3/uL (ref 0.7–4.0)
MCH: 33 pg (ref 26.0–34.0)
MCHC: 33.7 g/dL (ref 30.0–36.0)
MCV: 97.8 fL (ref 78.0–100.0)
Monocytes Absolute: 0.4 10*3/uL (ref 0.1–1.0)
Monocytes Relative: 7 %
NEUTROS ABS: 2.2 10*3/uL (ref 1.7–7.7)
NEUTROS PCT: 38 %
Platelets: 151 10*3/uL (ref 150–400)
RBC: 3.64 MIL/uL — AB (ref 3.87–5.11)
RDW: 12.8 % (ref 11.5–15.5)
WBC: 5.8 10*3/uL (ref 4.0–10.5)

## 2017-05-16 LAB — GLUCOSE, CAPILLARY
GLUCOSE-CAPILLARY: 133 mg/dL — AB (ref 65–99)
GLUCOSE-CAPILLARY: 208 mg/dL — AB (ref 65–99)

## 2017-05-16 LAB — BASIC METABOLIC PANEL
Anion gap: 9 (ref 5–15)
BUN: 14 mg/dL (ref 6–20)
CALCIUM: 9.3 mg/dL (ref 8.9–10.3)
CO2: 26 mmol/L (ref 22–32)
Chloride: 102 mmol/L (ref 101–111)
Creatinine, Ser: 0.73 mg/dL (ref 0.44–1.00)
Glucose, Bld: 140 mg/dL — ABNORMAL HIGH (ref 65–99)
POTASSIUM: 3.6 mmol/L (ref 3.5–5.1)
Sodium: 137 mmol/L (ref 135–145)

## 2017-05-16 MED ORDER — DOXYCYCLINE HYCLATE 100 MG PO TABS: 100.0000 mg | ORAL_TABLET | Freq: Two times a day (BID) | ORAL | 0 refills | Status: DC

## 2017-05-16 NOTE — Progress Notes (Signed)
[name] to be D/C'd Home per MD order.  Discussed with the patient and all questions fully answered.  VSS, Skin clean, dry and intact without evidence of skin break down, no evidence of skin tears noted. IV catheter discontinued intact. Site without signs and symptoms of complications. Dressing and pressure applied.  An After Visit Summary was printed and given to the patient. Patient received prescription.  D/c education completed with patient/family including follow up instructions, medication list, d/c activities limitations if indicated, with other d/c instructions as indicated by MD - patient able to verbalize understanding, all questions fully answered.   Patient instructed to return to ED, call 911, or call MD for any changes in condition.   Patient escorted via WC, and D/C home via private auto.  Allergies as of 05/16/2017   No Known Allergies     Medication List    TAKE these medications   aspirin EC 81 MG tablet Take 81 mg by mouth daily.   Black Currant Seed Oil 500 MG Caps Take 1 capsule by mouth 2 (two) times daily.   cetirizine 10 MG tablet Commonly known as:  ZYRTEC Take 10 mg by mouth daily.   clotrimazole-betamethasone cream Commonly known as:  LOTRISONE Apply to affected area 2 times daily   doxycycline 100 MG tablet Commonly known as:  VIBRA-TABS Take 1 tablet (100 mg total) by mouth every 12 (twelve) hours.   levothyroxine 50 MCG tablet Commonly known as:  SYNTHROID, LEVOTHROID TAKE 1 TABLET BY MOUTH  DAILY   MIRALAX PO Take by mouth.   MULTI-VITAMIN DAILY PO Take by mouth.   NON FORMULARY Apply 1 application topically See admin instructions. Horse linament  As needed   silver sulfADIAZINE 1 % cream Commonly known as:  SILVADENE APPLY 1 APPLICATION TOPICALLY DAILY.       [name] 05/16/2017 4:06 PM

## 2017-05-16 NOTE — Progress Notes (Signed)
Pt arrived floor from the Ed in the company of ED tech and family. Pt assessed to be AxOx1. Vitals signs completed. Will continue to monitor.

## 2017-05-16 NOTE — Evaluation (Signed)
Physical Therapy Evaluation Patient Details Name: [name] MRN: [id number] DOB: 09/13/1922 Today's Date: 05/16/2017   History of Present Illness  Pt adm with PNA. PMH - dementia, dm, htn, djd  Clinical Impression  Pt doing well with mobility and no further PT needed.  Ready for dc from PT standpoint.      Follow Up Recommendations No PT follow up    Equipment Recommendations  None recommended by PT    Recommendations for Other Services       Precautions / Restrictions Precautions Precautions: Fall Restrictions Weight Bearing Restrictions: No      Mobility  Bed Mobility Overal bed mobility: Needs Assistance Bed Mobility: Supine to Sit     Supine to sit: Supervision     General bed mobility comments: tactile cues to initiate  Transfers Overall transfer level: Needs assistance Equipment used: None;Rolling walker (2 wheeled) Transfers: Sit to/from Stand Sit to Stand: Supervision;Min guard            Ambulation/Gait Ambulation/Gait assistance: Supervision Ambulation Distance (Feet): 200 Feet Assistive device: Rolling walker (2 wheeled);None Gait Pattern/deviations: Step-through pattern;Decreased stride length   Gait velocity interpretation: at or above normal speed for age/gender General Gait Details: supervision for safety in unfamiliar environment  Stairs            Wheelchair Mobility    Modified Rankin (Stroke Patients Only)       Balance Overall balance assessment: Needs assistance Sitting-balance support: No upper extremity supported;Feet supported Sitting balance-Leahy Scale: Good     Standing balance support: No upper extremity supported;During functional activity Standing balance-Leahy Scale: Fair                               Pertinent Vitals/Pain Pain Assessment: Faces Faces Pain Scale: Hurts little more Pain Location: lt knee with initial movement Pain Descriptors / Indicators: Grimacing Pain  Intervention(s): Limited activity within patient's tolerance    Home Living Family/patient expects to be discharged to:: Private residence   Available Help at Discharge: Family   Home Access: Level entry     Home Layout: One level Home Equipment: Environmental consultant - 2 wheels      Prior Function Level of Independence: Independent with assistive device(s)         Comments: Uses RW at times     Hand Dominance        Extremity/Trunk Assessment   Upper Extremity Assessment Upper Extremity Assessment: Overall WFL for tasks assessed    Lower Extremity Assessment Lower Extremity Assessment: Overall WFL for tasks assessed       Communication   Communication: No difficulties  Cognition Arousal/Alertness: Awake/alert Behavior During Therapy: WFL for tasks assessed/performed Overall Cognitive Status: History of cognitive impairments - at baseline                                        General Comments      Exercises     Assessment/Plan    PT Assessment Patent does not need any further PT services  PT Problem List         PT Treatment Interventions      PT Goals (Current goals can be found in the Care Plan section)  Acute Rehab PT Goals PT Goal Formulation: All assessment and education complete, DC therapy    Frequency  Barriers to discharge        Co-evaluation               AM-PAC PT "6 Clicks" Daily Activity  Outcome Measure Difficulty turning over in bed (including adjusting bedclothes, sheets and blankets)?: None Difficulty moving from lying on back to sitting on the side of the bed? : A Little Difficulty sitting down on and standing up from a chair with arms (e.g., wheelchair, bedside commode, etc,.)?: A Little Help needed moving to and from a bed to chair (including a wheelchair)?: A Little Help needed walking in hospital room?: A Little Help needed climbing 3-5 steps with a railing? : A Little 6 Click Score: 19    End of  Session Equipment Utilized During Treatment: Gait belt Activity Tolerance: Patient tolerated treatment well Patient left: in chair;with call bell/phone within reach;with chair alarm set Nurse Communication: Mobility status(nurse tech) PT Visit Diagnosis: Unsteadiness on feet (R26.81)    Time: [id number] PT Time Calculation (min) (ACUTE ONLY): 19 min   Charges:   PT Evaluation $PT Eval Low Complexity: 1 Low     PT G CodesMarland Kitchen:        [facility] PT [phone]   Angelina OkCary [facility] 05/16/2017, 11:09 AM

## 2017-05-16 NOTE — Discharge Summary (Signed)
Physician Discharge Summary  [name] XBJ:[id number] DOB: 1923/02/08 DOA: 05/15/2017  PCP: Lucky CowboyMcKeown, William, MD  Admit date: 05/15/2017 Discharge date: 05/16/2017  Admitted From: home Disposition:  home  Recommendations for Outpatient Follow-up:  1. Follow up with PCP in 1-2 weeks 2. Continue doxycycline for 5 more days  Home Health: none Equipment/Devices: none  Discharge Condition: stable CODE STATUS: DNR Diet recommendation: regular  HPI: Per Dr. Inez Catalinaavid [name] is a 84 y.o. female with medical history significant of Alzheimer's dementia, diabetes, hypertension was seen by primary care physician yesterday and was sent to the emergency department because she has bilateral pneumonia.  Patient has not been recently hospitalized.  She is not recently been on any oral antibiotics.  She is eating and drinking normally.  Family denies any fevers.  She does have a mild cough.  Apparently the PCP advised family to come to the emergency department for hospitalization as she requires IV antibiotics for her pneumonia.  Patient has been given Rocephin and doxycycline in the emergency department.  She has been referred for admission for pneumonia.  She has had no nausea vomiting or diarrhea.  She has been a little weaker than normal but she is still ambulating.  Her mental status has been normal for her dementia according to family members.  Hospital Course: Patient was briefly admitted to the hospital for community-acquired pneumonia.  She was placed on antibiotics, has remained stable, she is afebrile, satting well on room air.  She does not have much in terms of respiratory symptoms.  Physical therapy was able to get patient up and she was able to ambulate in the hallway on room air without difficulties.  She will be transitioned to p.o. doxycycline, will be discharged home with 5 additional days.  She was able to eat well without any concern for aspiration while in the  hospital Essential hypertension-continue home meds Seizure (HCC)-continue home meds Alzheimer disease-noted Diabetes-placed on sliding scale insulin  Discharge Diagnoses:  Principal Problem:   PNA (pneumonia) Active Problems:   Essential hypertension   Seizure (HCC)   Alzheimer disease     Discharge Instructions   Allergies as of 05/16/2017   No Known Allergies     Medication List    TAKE these medications   aspirin EC 81 MG tablet Take 81 mg by mouth daily.   Black Currant Seed Oil 500 MG Caps Take 1 capsule by mouth 2 (two) times daily.   cetirizine 10 MG tablet Commonly known as:  ZYRTEC Take 10 mg by mouth daily.   clotrimazole-betamethasone cream Commonly known as:  LOTRISONE Apply to affected area 2 times daily   doxycycline 100 MG tablet Commonly known as:  VIBRA-TABS Take 1 tablet (100 mg total) by mouth every 12 (twelve) hours.   levothyroxine 50 MCG tablet Commonly known as:  SYNTHROID, LEVOTHROID TAKE 1 TABLET BY MOUTH  DAILY   MIRALAX PO Take by mouth.   MULTI-VITAMIN DAILY PO Take by mouth.   NON FORMULARY Apply 1 application topically See admin instructions. Horse linament  As needed   silver sulfADIAZINE 1 % cream Commonly known as:  SILVADENE APPLY 1 APPLICATION TOPICALLY DAILY.      Consultations:  None   Procedures/Studies:  Dg Chest 2 View  Result Date: 05/15/2017 CLINICAL DATA:  Abnormal breath sounds., mental status changes. EXAM: CHEST - 2 VIEW COMPARISON:  PA and lateral chest x-ray of October 13, 2016 FINDINGS: The lungs are adequately inflated. The interstitial markings are  coarse and more conspicuous overall. Areas of patchy near confluence are noted in the upper lobes and in the right infrahilar region. The heart and pulmonary vascularity are normal. There is calcification in the wall of the thoracic aorta. There is no pleural effusion. The bony thorax is unremarkable. IMPRESSION: Chronic bronchitic changes. Probable  superimposed interstitial pneumonia bilaterally. No CHF. Followup PA and lateral chest X-ray is recommended in 3-4 weeks following trial of antibiotic therapy to ensure resolution and exclude underlying malignancy. Thoracic aortic atherosclerosis. Electronically Signed   By: Julie  Holloway M.D.   On: 05/15/2017 09:08   Ct Head Wo Contrast  Result Date: 05/14/2017 CLINICAL DATA:  Larey Seat 05/10/17 Some slurring of speech confusion unsteady gait increased lethargy R/o SDH EXAM: CT HEAD WITHOUT CONTRAST TECHNIQUE: Contiguous axial images were obtained from the base of the skull through the vertex without intravenous contrast. COMPARISON:  Head CT 5 2 14, brain MRI 06/14/2012 FINDINGS: Brain: No acute intracranial hemorrhage. No focal mass lesion. No CT evidence of acute infarction. No midline shift or mass effect. No hydrocephalus. Basilar cisterns are patent. There are periventricular and subcortical white matter hypodensities. Generalized cortical atrophy. Vascular: No hyperdense vessel or unexpected calcification. Skull: Normal. Negative for fracture or focal lesion. Sinuses/Orbits: Paranasal sinuses and mastoid air cells are clear. Orbits are clear. Other: None. IMPRESSION: 1. No acute intracranial findings. 2. Atrophy and white matter microvascular disease Electronically Signed   By: [name] M.D.   On: 05/14/2017 16:52     Subjective: - no chest pain, shortness of breath, no abdominal pain, nausea or vomiting.   Discharge Exam: Vitals:   05/16/17 0626 05/16/17 1220  BP: (!) 150/68 133/67  Pulse: 72 64  Resp: 18 20  Temp: 98.1 F (36.7 C) 98.9 F (37.2 C)  SpO2: 98% 97%    General: Pt is alert, awake, not in acute distress Cardiovascular: RRR, S1/S2 +, no rubs, no gallops Respiratory: CTA bilaterally, no wheezing, no rhonchi Abdominal: Soft, NT, ND, bowel sounds + Extremities: no edema, no cyanosis   The results of significant diagnostics from this hospitalization (including  imaging, microbiology, ancillary and laboratory) are listed below for reference.     Microbiology: Recent Results (from the past 240 hour(s))  Urine Culture     Status: None   Collection Time: 05/14/17  2:39 PM  Result Value Ref Range Status   MICRO NUMBER: [id number]  Final   SPECIMEN QUALITY: ADEQUATE  Final   Sample Source URINE  Final   STATUS: FINAL  Final   Result: No Growth  Final  Culture, blood (routine x 2)     Status: None (Preliminary result)   Collection Time: 05/15/17  6:15 PM  Result Value Ref Range Status   Specimen Description BLOOD RIGHT ANTECUBITAL  Final   Special Requests   Final    BOTTLES DRAWN AEROBIC AND ANAEROBIC Blood Culture adequate volume   Culture   Final    NO GROWTH < 24 HOURS Performed at [facility], [address] [place], [place] [address]    Report Status PENDING  Incomplete  Culture, blood (routine x 2) Call MD if unable to obtain prior to antibiotics being given     Status: None (Preliminary result)   Collection Time: 05/15/17  6:56 PM  Result Value Ref Range Status   Specimen Description BLOOD RIGHT WRIST  Final   Special Requests   Final    BOTTLES DRAWN AEROBIC AND ANAEROBIC Blood Culture adequate volume  Culture   Final    NO GROWTH < 24 HOURS Performed at [facility], [address], [place], [place] [address]    Report Status PENDING  Incomplete     Labs: BNP (last 3 results) No results for input(s): BNP in the last 8760 hours. Basic Metabolic Panel: Recent Labs  Lab 05/14/17 1439 05/15/17 1450 05/16/17 0401  NA 137 132* 137  K 4.8 4.0 3.6  CL 99 99* 102  CO2 31 24 26   GLUCOSE 352* 356* 140*  BUN 16 15 14   CREATININE 0.90* 0.84 0.73  CALCIUM 9.7 9.2 9.3   Liver Function Tests: Recent Labs  Lab 05/14/17 1439 05/15/17 1450  AST 40* 41  ALT 29 29  ALKPHOS  --  70  BILITOT 0.6 0.6  PROT 8.2* 7.5  ALBUMIN  --  2.9*   No results for input(s): LIPASE, AMYLASE in the last 168 hours. No results for  input(s): AMMONIA in the last 168 hours. CBC: Recent Labs  Lab 05/14/17 1439 05/15/17 1450 05/16/17 0401  WBC 6.8 5.8 5.8  NEUTROABS 3,046 2.6 2.2  HGB 12.2 11.9* 12.0  HCT 36.0 36.0 35.6*  MCV 96.0 97.6 97.8  PLT 175 158 151   Cardiac Enzymes: No results for input(s): CKTOTAL, CKMB, CKMBINDEX, TROPONINI in the last 168 hours. BNP: Invalid input(s): POCBNP CBG: Recent Labs  Lab 05/15/17 2143 05/16/17 0754 05/16/17 1215  GLUCAP 138* 133* 208*   D-Dimer No results for input(s): DDIMER in the last 72 hours. Hgb A1c No results for input(s): HGBA1C in the last 72 hours. Lipid Profile No results for input(s): CHOL, HDL, LDLCALC, TRIG, CHOLHDL, LDLDIRECT in the last 72 hours. Thyroid function studies Recent Labs    05/14/17 1439  TSH 4.62*   Anemia work up No results for input(s): VITAMINB12, FOLATE, FERRITIN, TIBC, IRON, RETICCTPCT in the last 72 hours. Urinalysis    Component Value Date/Time   COLORURINE YELLOW 05/14/2017 1439   APPEARANCEUR CLEAR 05/14/2017 1439   LABSPEC 1.014 05/14/2017 1439   PHURINE 6.5 05/14/2017 1439   GLUCOSEU 2+ (A) 05/14/2017 1439   HGBUR NEGATIVE 05/14/2017 1439   BILIRUBINUR NEGATIVE 10/13/2016 2112   KETONESUR NEGATIVE 05/14/2017 1439   PROTEINUR NEGATIVE 05/14/2017 1439   UROBILINOGEN 1 06/14/2014 1528   NITRITE NEGATIVE 05/14/2017 1439   LEUKOCYTESUR 1+ (A) 05/14/2017 1439   Sepsis Labs Invalid input(s): PROCALCITONIN,  WBC,  LACTICIDVEN   Time coordinating discharge: 25 minutes  SIGNED:  Pamella Pert, MD  [facility] 05/16/2017, 4:46 PM Pager [phone]  If 7PM-7AM, please contact night-coverage [email or web address] Password [email or web address]

## 2017-05-20 ENCOUNTER — Ambulatory Visit (INDEPENDENT_AMBULATORY_CARE_PROVIDER_SITE_OTHER): Payer: Medicare Other | Admitting: Adult Health

## 2017-05-20 ENCOUNTER — Encounter: Payer: Self-pay | Admitting: Adult Health

## 2017-05-20 VITALS — BP 122/58 | HR 72 | Temp 97.3°F | Ht 63.0 in | Wt 115.0 lb

## 2017-05-20 DIAGNOSIS — F0281 Dementia in other diseases classified elsewhere with behavioral disturbance: Secondary | ICD-10-CM

## 2017-05-20 DIAGNOSIS — Z9181 History of falling: Secondary | ICD-10-CM

## 2017-05-20 DIAGNOSIS — J189 Pneumonia, unspecified organism: Secondary | ICD-10-CM | POA: Diagnosis not present

## 2017-05-20 DIAGNOSIS — G301 Alzheimer's disease with late onset: Secondary | ICD-10-CM | POA: Diagnosis not present

## 2017-05-20 LAB — CULTURE, BLOOD (ROUTINE X 2)
CULTURE: NO GROWTH
CULTURE: NO GROWTH
SPECIAL REQUESTS: ADEQUATE
SPECIAL REQUESTS: ADEQUATE

## 2017-05-20 MED ORDER — TRAZODONE HCL 50 MG PO TABS: ORAL_TABLET | ORAL | 1 refills | Status: DC

## 2017-05-20 NOTE — Progress Notes (Signed)
Hospital follow up  Assessment and Plan: Hospital visit follow up for: pneumonia Hospital discharge meds were reviewed, and reconciled with the patient:  Pneumonia of both lungs due to infectious organism, unspecified part of lung Lungs clear today; no signs of residual complications; will follow up in 2 weeks with CXR per discharge recommendations. CBC/BMP stable at discharge - no need for follow up.  -     DG Chest 2 View; Future  Late onset Alzheimer's disease with behavioral disturbance Some confusion with night time awakening, difficulty sleeping through the night Discussed risks and benefits of medications, shared there is some research showing possible increased mortality in elders taking sedating medications. The daughter expresses her understanding and wishes to proceed for quality of life reasons. We will start at low dose, follow up closely as scheduled in 4 weeks and d/c if not indicated.   -     traZODone (DESYREL) 50 MG tablet; 1/2-1 tablet for sleep - start with lower dose and only increase if needed.  At high risk for falls -     Ambulatory referral to Home Health for PT evaluation and strengthening  Over 40 minutes of exam, counseling, chart review, and complex, high/moderate level critical decision making was performed this visit.   Future Appointments  Date Time Provider Department Center  06/17/2017  2:00 PM Quentin Mulling, PA-C [facility] None     HPI 84 y.o.female presents for follow up for transition from recent hospitalization. Admit date to the hospital was 05/15/17, patient was discharged from the hospital on 05/16/17 and our clinical staff contacted the office the day after discharge to set up a follow up appointment. The discharge summary, medications, and diagnostic test results were reviewed before meeting with the patient. The patient was admitted for: pneumonia.   She presented to our clinic on 4/2 after multiple falls, family reporting imbalance all day, some  slurred speech; also reported she had been hallucinating, talking to dead relatives and staying up all night. Generalized increased fatigue, worsening incontinence. Possible bilateral interstitial pneumonia was noted on CXR obtained as part of the workup (CT head obtained 4/2 showed no acute findings, urine and other labs were stable/unremarkable) and she was recommended to present to ED for possible IV abx and monitoring; she was administered rocephin/doxycycline in the ED and admitted for CAP; she was stable/afebrile and satted well on room O2 during her stay. Ambulated well with PT without difficulty; eating/drinking without concern for aspiration; was transitioned to p.o. Doxycycline and discharged home.   She presents today A&O x2, accompanied by her daughter, has completed doxycycline without issue, at baseline per daughter other than small residual non-productive cough and not sleeping well through the night. She reports she doses during the day and wakes up frequently during the night and is confused/hallucinating at that time. She reports during the day/prior to sleeping for the night she does not have any agitation or hallucinations. The daughter also reports she is somewhat weak compared to previous, having some difficulty standing up without assistance, walks with walker but not exercising much at home.   Home health is not currently involved.   Images while in the hospital: No results found.  Past Medical History:  Diagnosis Date  . Allergy    "all year round" (05/16/2017)  . Alzheimer disease 12/11/2016   "not real bad; developing slow" (05/16/2017)  . Arthritis    "knees" (05/16/2017)  . Depression   . DJD (degenerative joint disease)   . GERD (gastroesophageal reflux  disease)   . Glaucoma   . Hepatitis C   . Hyperlipidemia   . Hypertension   . Hypothyroidism   . Pneumonia ~ 2016; 05/2017  . Seizures (HCC) ~ 2013X 1   "she was real sick and admitted to hospital; had sz at home"  .  Type II diabetes mellitus (HCC)   . Vitamin D deficiency      No Known Allergies    Current Outpatient Medications on File Prior to Visit  Medication Sig Dispense Refill  . aspirin EC 81 MG tablet Take 81 mg by mouth daily.    . Black Currant Seed Oil 500 MG CAPS Take 1 capsule by mouth 2 (two) times daily.    . cetirizine (ZYRTEC) 10 MG tablet Take 10 mg by mouth daily.    . clotrimazole-betamethasone (LOTRISONE) cream Apply to affected area 2 times daily 15 g 1  . doxycycline (VIBRA-TABS) 100 MG tablet Take 1 tablet (100 mg total) by mouth every 12 (twelve) hours. 12 tablet 0  . levothyroxine (SYNTHROID, LEVOTHROID) 50 MCG tablet TAKE 1 TABLET BY MOUTH  DAILY 90 tablet 1  . Multiple Vitamin (MULTI-VITAMIN DAILY PO) Take by mouth.    . NON FORMULARY Apply 1 application topically See admin instructions. Horse linament  As needed    . Polyethylene Glycol 3350 (MIRALAX PO) Take by mouth.    . silver sulfADIAZINE (SILVADENE) 1 % cream APPLY 1 APPLICATION TOPICALLY DAILY. 50 g 1   No current facility-administered medications on file prior to visit.     ROS: all negative except above.   Physical Exam: Filed Weights   05/20/17 1608  Weight: 115 lb (52.2 kg)   BP (!) 122/58   Pulse 72   Temp (!) 97.3 F (36.3 C)   Ht 5\' 3"  (1.6 m)   Wt 115 lb (52.2 kg)   SpO2 92%   BMI 20.37 kg/m  General Appearance: Frail elder, in no apparent distress. Eyes: R iris cloudy/blind, left pupil reactive to light, conjunctiva no swelling or erythema Sinuses: No Frontal/maxillary tenderness ENT/Mouth: Ext aud canals clear, TMs without erythema, bulging. No erythema, swelling, or exudate on post pharynx.  Tonsils not swollen or erythematous. Somewhat HOH.  Neck: Supple, thyroid normal.   Respiratory: Respiratory effort normal, expansion limited, BS equal bilaterally without rales, rhonchi, wheezing or stridor.  Cardio: RRR with no MRGs. Brisk peripheral pulses without edema.  Abdomen: Soft, + BS.   Non tender, no guarding, rebound, hernias, masses. Lymphatics: Non tender without lymphadenopathy.  Musculoskeletal: Very slow sitting to standing, very slow gait with walker  Skin: Warm, dry without rashes, lesions, ecchymosis.  Neuro: Cranial nerves intact. Normal muscle tone, no cerebellar symptoms. Sensation intact.  Psych: Awake and oriented X 2, normal affect, Insight and Judgment poor.     Dan MakerAshley C Tylyn Stankovich, NP 5:13 PM [facility]

## 2017-05-20 NOTE — Patient Instructions (Addendum)
good sleep hygiene discussed, increase day time activity, try melatonin or benadryl if this does not help we will call in sleep medication.   Can try low dose trazodone - start with low dose, 25 mg or 1/2 tab daily.    Trazodone tablets What is this medicine? TRAZODONE (TRAZ oh done) is used to treat depression. This medicine may be used for other purposes; ask your health care provider or pharmacist if you have questions. COMMON BRAND NAME(S): Desyrel What should I tell my health care provider before I take this medicine? They need to know if you have any of these conditions: -attempted suicide or thinking about it -bipolar disorder -bleeding problems -glaucoma -heart disease, or previous heart attack -irregular heart beat -kidney or liver disease -low levels of sodium in the blood -an unusual or allergic reaction to trazodone, other medicines, foods, dyes or preservatives -pregnant or trying to get pregnant -breast-feeding How should I use this medicine? Take this medicine by mouth with a glass of water. Follow the directions on the prescription label. Take this medicine shortly after a meal or a light snack. Take your medicine at regular intervals. Do not take your medicine more often than directed. Do not stop taking this medicine suddenly except upon the advice of your doctor. Stopping this medicine too quickly may cause serious side effects or your condition may worsen. A special MedGuide will be given to you by the pharmacist with each prescription and refill. Be sure to read this information carefully each time. Talk to your pediatrician regarding the use of this medicine in children. Special care may be needed. Overdosage: If you think you have taken too much of this medicine contact a poison control center or emergency room at once. NOTE: This medicine is only for you. Do not share this medicine with others. What if I miss a dose? If you miss a dose, take it as soon as you  can. If it is almost time for your next dose, take only that dose. Do not take double or extra doses. What may interact with this medicine? Do not take this medicine with any of the following medications: -certain medicines for fungal infections like fluconazole, itraconazole, ketoconazole, posaconazole, voriconazole -cisapride -dofetilide -dronedarone -linezolid -MAOIs like Carbex, Eldepryl, Marplan, Nardil, and Parnate -mesoridazine -methylene blue (injected into a vein) -pimozide -saquinavir -thioridazine -ziprasidone This medicine may also interact with the following medications: -alcohol -antiviral medicines for HIV or AIDS -aspirin and aspirin-like medicines -barbiturates like phenobarbital -certain medicines for blood pressure, heart disease, irregular heart beat -certain medicines for depression, anxiety, or psychotic disturbances -certain medicines for migraine headache like almotriptan, eletriptan, frovatriptan, naratriptan, rizatriptan, sumatriptan, zolmitriptan -certain medicines for seizures like carbamazepine and phenytoin -certain medicines for sleep -certain medicines that treat or prevent blood clots like dalteparin, enoxaparin, warfarin -digoxin -fentanyl -lithium -NSAIDS, medicines for pain and inflammation, like ibuprofen or naproxen -other medicines that prolong the QT interval (cause an abnormal heart rhythm) -rasagiline -supplements like St. John's wort, kava kava, valerian -tramadol -tryptophan This list may not describe all possible interactions. Give your health care provider a list of all the medicines, herbs, non-prescription drugs, or dietary supplements you use. Also tell them if you smoke, drink alcohol, or use illegal drugs. Some items may interact with your medicine. What should I watch for while using this medicine? Tell your doctor if your symptoms do not get better or if they get worse. Visit your doctor or health care professional for regular  checks on your  progress. Because it may take several weeks to see the full effects of this medicine, it is important to continue your treatment as prescribed by your doctor. Patients and their families should watch out for new or worsening thoughts of suicide or depression. Also watch out for sudden changes in feelings such as feeling anxious, agitated, panicky, irritable, hostile, aggressive, impulsive, severely restless, overly excited and hyperactive, or not being able to sleep. If this happens, especially at the beginning of treatment or after a change in dose, call your health care professional. Dennis Bast may get drowsy or dizzy. Do not drive, use machinery, or do anything that needs mental alertness until you know how this medicine affects you. Do not stand or sit up quickly, especially if you are an older patient. This reduces the risk of dizzy or fainting spells. Alcohol may interfere with the effect of this medicine. Avoid alcoholic drinks. This medicine may cause dry eyes and blurred vision. If you wear contact lenses you may feel some discomfort. Lubricating drops may help. See your eye doctor if the problem does not go away or is severe. Your mouth may get dry. Chewing sugarless gum, sucking hard candy and drinking plenty of water may help. Contact your doctor if the problem does not go away or is severe. What side effects may I notice from receiving this medicine? Side effects that you should report to your doctor or health care professional as soon as possible: -allergic reactions like skin rash, itching or hives, swelling of the face, lips, or tongue -elevated mood, decreased need for sleep, racing thoughts, impulsive behavior -confusion -fast, irregular heartbeat -feeling faint or lightheaded, falls -feeling agitated, angry, or irritable -loss of balance or coordination -painful or prolonged erections -restlessness, pacing, inability to keep still -suicidal thoughts or other mood  changes -tremors -trouble sleeping -seizures -unusual bleeding or bruising Side effects that usually do not require medical attention (report to your doctor or health care professional if they continue or are bothersome): -change in sex drive or performance -change in appetite or weight -constipation -headache -muscle aches or pains -nausea This list may not describe all possible side effects. Call your doctor for medical advice about side effects. You may report side effects to FDA at 1-800-FDA-1088. Where should I keep my medicine? Keep out of the reach of children. Store at room temperature between 15 and 30 degrees C (59 to 86 degrees F). Protect from light. Keep container tightly closed. Throw away any unused medicine after the expiration date. NOTE: This sheet is a summary. It may not cover all possible information. If you have questions about this medicine, talk to your doctor, pharmacist, or health care provider.  2018 Elsevier/Gold Standard (2015-06-30 16:57:05)

## 2017-05-21 ENCOUNTER — Telehealth: Payer: Self-pay

## 2017-05-21 NOTE — Telephone Encounter (Signed)
Daughter notified 

## 2017-05-21 NOTE — Telephone Encounter (Signed)
Daughter called to let you know that Trazodone 50mg ,is the medication that helps her mom sleep. Patient has been taking her daughter's Trazodone. Wants to know if this is ok for her, if not is there something else she can take. Please advise.

## 2017-05-30 ENCOUNTER — Other Ambulatory Visit: Payer: Self-pay | Admitting: Physician Assistant

## 2017-05-30 ENCOUNTER — Ambulatory Visit (HOSPITAL_COMMUNITY)
Admission: RE | Admit: 2017-05-30 | Discharge: 2017-05-30 | Disposition: A | Discharge status: Home / Self Care | Payer: Medicare Other | Source: Ambulatory Visit | Attending: Adult Health | Admitting: Adult Health

## 2017-05-30 DIAGNOSIS — J189 Pneumonia, unspecified organism: Secondary | ICD-10-CM | POA: Diagnosis not present

## 2017-05-31 ENCOUNTER — Inpatient Hospital Stay (HOSPITAL_COMMUNITY): Admission: RE | Admit: 2017-05-31 | Payer: Medicare Other | Source: Ambulatory Visit

## 2017-05-31 ENCOUNTER — Other Ambulatory Visit (HOSPITAL_COMMUNITY): Payer: Medicare Other

## 2017-06-01 DIAGNOSIS — E782 Mixed hyperlipidemia: Secondary | ICD-10-CM | POA: Diagnosis not present

## 2017-06-01 DIAGNOSIS — J189 Pneumonia, unspecified organism: Secondary | ICD-10-CM | POA: Diagnosis not present

## 2017-06-01 DIAGNOSIS — Z9181 History of falling: Secondary | ICD-10-CM | POA: Diagnosis not present

## 2017-06-01 DIAGNOSIS — R1310 Dysphagia, unspecified: Secondary | ICD-10-CM | POA: Diagnosis not present

## 2017-06-01 DIAGNOSIS — E1122 Type 2 diabetes mellitus with diabetic chronic kidney disease: Secondary | ICD-10-CM | POA: Diagnosis not present

## 2017-06-01 DIAGNOSIS — E559 Vitamin D deficiency, unspecified: Secondary | ICD-10-CM | POA: Diagnosis not present

## 2017-06-01 DIAGNOSIS — Z7982 Long term (current) use of aspirin: Secondary | ICD-10-CM | POA: Diagnosis not present

## 2017-06-01 DIAGNOSIS — N183 Chronic kidney disease, stage 3 (moderate): Secondary | ICD-10-CM | POA: Diagnosis not present

## 2017-06-01 DIAGNOSIS — E039 Hypothyroidism, unspecified: Secondary | ICD-10-CM | POA: Diagnosis not present

## 2017-06-01 DIAGNOSIS — D649 Anemia, unspecified: Secondary | ICD-10-CM | POA: Diagnosis not present

## 2017-06-01 DIAGNOSIS — Z79891 Long term (current) use of opiate analgesic: Secondary | ICD-10-CM | POA: Diagnosis not present

## 2017-06-01 DIAGNOSIS — G301 Alzheimer's disease with late onset: Secondary | ICD-10-CM | POA: Diagnosis not present

## 2017-06-03 ENCOUNTER — Telehealth: Payer: Self-pay | Admitting: *Deleted

## 2017-06-03 NOTE — Telephone Encounter (Signed)
A message was left to inform [name] with [facility], it is OK for a speech evaluation regarding difficulty swallowing, per Dr Oneta RackMcKeown.

## 2017-06-04 DIAGNOSIS — N183 Chronic kidney disease, stage 3 (moderate): Secondary | ICD-10-CM | POA: Diagnosis not present

## 2017-06-04 DIAGNOSIS — E1122 Type 2 diabetes mellitus with diabetic chronic kidney disease: Secondary | ICD-10-CM | POA: Diagnosis not present

## 2017-06-04 DIAGNOSIS — G301 Alzheimer's disease with late onset: Secondary | ICD-10-CM | POA: Diagnosis not present

## 2017-06-04 DIAGNOSIS — D649 Anemia, unspecified: Secondary | ICD-10-CM | POA: Diagnosis not present

## 2017-06-04 DIAGNOSIS — R1310 Dysphagia, unspecified: Secondary | ICD-10-CM | POA: Diagnosis not present

## 2017-06-04 DIAGNOSIS — E559 Vitamin D deficiency, unspecified: Secondary | ICD-10-CM | POA: Diagnosis not present

## 2017-06-04 DIAGNOSIS — Z7982 Long term (current) use of aspirin: Secondary | ICD-10-CM | POA: Diagnosis not present

## 2017-06-04 DIAGNOSIS — Z79891 Long term (current) use of opiate analgesic: Secondary | ICD-10-CM | POA: Diagnosis not present

## 2017-06-04 DIAGNOSIS — J189 Pneumonia, unspecified organism: Secondary | ICD-10-CM | POA: Diagnosis not present

## 2017-06-04 DIAGNOSIS — E039 Hypothyroidism, unspecified: Secondary | ICD-10-CM | POA: Diagnosis not present

## 2017-06-04 DIAGNOSIS — Z9181 History of falling: Secondary | ICD-10-CM | POA: Diagnosis not present

## 2017-06-04 DIAGNOSIS — E782 Mixed hyperlipidemia: Secondary | ICD-10-CM | POA: Diagnosis not present

## 2017-06-05 ENCOUNTER — Other Ambulatory Visit: Payer: Self-pay | Admitting: Physician Assistant

## 2017-06-05 DIAGNOSIS — E441 Mild protein-calorie malnutrition: Secondary | ICD-10-CM

## 2017-06-05 DIAGNOSIS — R1310 Dysphagia, unspecified: Secondary | ICD-10-CM

## 2017-06-06 DIAGNOSIS — E782 Mixed hyperlipidemia: Secondary | ICD-10-CM | POA: Diagnosis not present

## 2017-06-06 DIAGNOSIS — Z79891 Long term (current) use of opiate analgesic: Secondary | ICD-10-CM | POA: Diagnosis not present

## 2017-06-06 DIAGNOSIS — J189 Pneumonia, unspecified organism: Secondary | ICD-10-CM | POA: Diagnosis not present

## 2017-06-06 DIAGNOSIS — D649 Anemia, unspecified: Secondary | ICD-10-CM | POA: Diagnosis not present

## 2017-06-06 DIAGNOSIS — E1122 Type 2 diabetes mellitus with diabetic chronic kidney disease: Secondary | ICD-10-CM | POA: Diagnosis not present

## 2017-06-06 DIAGNOSIS — G301 Alzheimer's disease with late onset: Secondary | ICD-10-CM | POA: Diagnosis not present

## 2017-06-06 DIAGNOSIS — R1310 Dysphagia, unspecified: Secondary | ICD-10-CM | POA: Diagnosis not present

## 2017-06-06 DIAGNOSIS — N183 Chronic kidney disease, stage 3 (moderate): Secondary | ICD-10-CM | POA: Diagnosis not present

## 2017-06-06 DIAGNOSIS — Z9181 History of falling: Secondary | ICD-10-CM | POA: Diagnosis not present

## 2017-06-06 DIAGNOSIS — E039 Hypothyroidism, unspecified: Secondary | ICD-10-CM | POA: Diagnosis not present

## 2017-06-06 DIAGNOSIS — Z7982 Long term (current) use of aspirin: Secondary | ICD-10-CM | POA: Diagnosis not present

## 2017-06-06 DIAGNOSIS — E559 Vitamin D deficiency, unspecified: Secondary | ICD-10-CM | POA: Diagnosis not present

## 2017-06-07 ENCOUNTER — Telehealth: Payer: Self-pay | Admitting: *Deleted

## 2017-06-07 NOTE — Telephone Encounter (Signed)
OK to visit patient 1 time for 3 weeks for speech therapy, per Dr Oneta RackMcKeown.

## 2017-06-12 DIAGNOSIS — N183 Chronic kidney disease, stage 3 (moderate): Secondary | ICD-10-CM | POA: Diagnosis not present

## 2017-06-12 DIAGNOSIS — D649 Anemia, unspecified: Secondary | ICD-10-CM | POA: Diagnosis not present

## 2017-06-12 DIAGNOSIS — E1122 Type 2 diabetes mellitus with diabetic chronic kidney disease: Secondary | ICD-10-CM | POA: Diagnosis not present

## 2017-06-12 DIAGNOSIS — G301 Alzheimer's disease with late onset: Secondary | ICD-10-CM | POA: Diagnosis not present

## 2017-06-12 DIAGNOSIS — Z7982 Long term (current) use of aspirin: Secondary | ICD-10-CM | POA: Diagnosis not present

## 2017-06-12 DIAGNOSIS — Z79891 Long term (current) use of opiate analgesic: Secondary | ICD-10-CM | POA: Diagnosis not present

## 2017-06-12 DIAGNOSIS — J189 Pneumonia, unspecified organism: Secondary | ICD-10-CM | POA: Diagnosis not present

## 2017-06-12 DIAGNOSIS — E782 Mixed hyperlipidemia: Secondary | ICD-10-CM | POA: Diagnosis not present

## 2017-06-12 DIAGNOSIS — E559 Vitamin D deficiency, unspecified: Secondary | ICD-10-CM | POA: Diagnosis not present

## 2017-06-12 DIAGNOSIS — M1711 Unilateral primary osteoarthritis, right knee: Secondary | ICD-10-CM | POA: Diagnosis not present

## 2017-06-12 DIAGNOSIS — E039 Hypothyroidism, unspecified: Secondary | ICD-10-CM | POA: Diagnosis not present

## 2017-06-12 DIAGNOSIS — Z9181 History of falling: Secondary | ICD-10-CM | POA: Diagnosis not present

## 2017-06-12 DIAGNOSIS — R1310 Dysphagia, unspecified: Secondary | ICD-10-CM | POA: Diagnosis not present

## 2017-06-12 DIAGNOSIS — M1712 Unilateral primary osteoarthritis, left knee: Secondary | ICD-10-CM | POA: Diagnosis not present

## 2017-06-13 NOTE — Progress Notes (Signed)
CPE AND FOLLOW UP  Assessment:   Essential hypertension - continue medications, DASH diet, exercise and monitor at home. Call if greater than 130/80.  - BASIC METABOLIC PANEL WITH GFR - Hepatic function panel  T2_NIDDM /CKD3 Discussed general issues about diabetes pathophysiology and management., Educational material distributed., Suggested low cholesterol diet., Encouraged aerobic exercise., Discussed foot care., Reminded to get yearly retinal exam. - Hemoglobin A1c   Hypothyroidism, unspecified hypothyroidism type Hypothyroidism-check TSH level, continue medications the same, reminded to take on an empty stomach 30-63mins before food.  - TSH   Mixed hyperlipidemia -continue medications, check lipids, decrease fatty foods, increase activity.  - Lipid panel  Normocytic anemia - CBC with Differential/Platelet   Seizure remission   Vitamin D deficiency - Vit D  25 hydroxy (rtn osteoporosis monitoring)  Encounter for long-term (current) use of medications - Magnesium  Hepatitis C virus infection without hepatic coma, unspecified chronicity remission  Knee pain, bilateral Better after injections  Medicare wellness Declines wellness/advances directives Discussed palliative care and daughter is agreeable and when the patient qualifies for hospice she will be agreeable.   SDAT discussed with age, it is normal/age related Patient is having hallucinations, will check labs for infection Continue trazodone, declines seroquel at this time  Cough Will get CXR and check labs If recurrent pneumonia need reevaluation for aspiration Get on allergy pill daily No swelling in legs Versus atelectasis /inflammation- prednisone low dose given  Over 30 minutes of exam, counseling, chart review, and critical decision making was performed Future Appointments  Date Time Provider Department Center  06/17/2017  2:00 PM Quentin Mulling, PA-C [facility] None  06/23/2018  2:00 PM Quentin Mulling, PA-C [facility] None   During the course of the visit the patient was educated and counseled about appropriate screening and preventive services including:    Pneumococcal vaccine   Influenza vaccine  Td vaccine  Screening electrocardiogram  Screening mammography  Bone densitometry screening  Colorectal cancer screening  Diabetes screening  Glaucoma screening  Nutrition counseling   Advanced directives: given info/requested   Subjective:   [name] is a 84 y.o. female who presents for medicare wellness and 3 month follow up on hypertension, diabetes, hyperlipidemia, vitamin D def.   Has had recent hospitalization for bilateral pneumonia, presenting with congestion. The aide found some blood in the tissue that she is spitting up, spitting up at night. Daughter states her eyes and nose are running. She is moaning a lot.   Daughter, [name], is interested in hospice or palliative care due to declining health.  She has dementia and her daughter provides the majority of the history. She is having hallucinations, trouble with swallowing.   Patient lives with her daughter, denies any falls in the past year.  She complains of chronic pain in bilateral knees and follows with Dr. Heron Nay, and had bilateral knee shots that have helped not taking any tramadol, Walks with walker. No falls recently.     Her weight is back up, daughter is feeding her baby food. She is coughing after eating foods and belching after eating foods, saying food is not going down. Has seen speech therapy and has altered her eating.  Wt Readings from Last 5 Encounters:  06/14/17 121 lb (54.9 kg)  05/20/17 115 lb (52.2 kg)  05/15/17 125 lb (56.7 kg)  05/14/17 125 lb 9.6 oz (57 kg)  12/11/16 122 lb 8 oz (55.6 kg)    Her blood pressure has been controlled at home,  today their BP is BP: 120/68.  She does not workout. She denies chest pain, shortness of breath, dizziness.  She is  not on cholesterol medication and denies myalgias. Her cholesterol is not at goal. The cholesterol last visit was:   Lab Results  Component Value Date   CHOL 203 (H) 12/20/2014   HDL 64 12/20/2014   LDLCALC 114 12/20/2014   TRIG 124 12/20/2014   CHOLHDL 3.2 12/20/2014   She has been working on diet and exercise for diabetes with CKD, she is off ARB due to hypotension, she is off the metformin and is diet controlled, sugars have been monitored very closely by her daughter, and denies paresthesia of the feet, polydipsia, polyuria and visual disturbances. Last A1C in the office was:  Lab Results  Component Value Date   HGBA1C 6.3 (H) 10/31/2016   Patient is on Vitamin D supplement. Lab Results  Component Value Date   VD25OH 45 09/15/2014     She has a history of GERD.  She is on thyroid medication. Her medication was not changed last visit.   Lab Results  Component Value Date   TSH 4.62 (H) 05/14/2017  .   Medication Review Current Outpatient Medications on File Prior to Visit  Medication Sig Dispense Refill  . aspirin EC 81 MG tablet Take 81 mg by mouth daily.    . Black Currant Seed Oil 500 MG CAPS Take 1 capsule by mouth 2 (two) times daily.    . cetirizine (ZYRTEC) 10 MG tablet Take 10 mg by mouth daily.    . clotrimazole-betamethasone (LOTRISONE) cream Apply to affected area 2 times daily 15 g 1  . doxycycline (VIBRA-TABS) 100 MG tablet Take 1 tablet (100 mg total) by mouth every 12 (twelve) hours. 12 tablet 0  . levothyroxine (SYNTHROID, LEVOTHROID) 50 MCG tablet TAKE 1 TABLET BY MOUTH  DAILY 90 tablet 1  . Multiple Vitamin (MULTI-VITAMIN DAILY PO) Take by mouth.    . NON FORMULARY Apply 1 application topically See admin instructions. Horse linament  As needed    . Polyethylene Glycol 3350 (MIRALAX PO) Take by mouth.    . silver sulfADIAZINE (SILVADENE) 1 % cream APPLY 1 APPLICATION TOPICALLY DAILY. 50 g 1  . traZODone (DESYREL) 50 MG tablet 1/2-1 tablet for sleep - start  with lower dose and only increase if needed. 60 tablet 1   No current facility-administered medications on file prior to visit.     Current Problems (verified) Patient Active Problem List   Diagnosis Date Noted  . Mild malnutrition (HCC) 05/15/2017  . PNA (pneumonia) 05/15/2017  . Alzheimer disease 12/11/2016  . Encounter for Medicare annual wellness exam 09/08/2014  . Knee pain, bilateral 06/14/2014  . Hypothyroidism 02/23/2014  . Medication management 08/20/2013  . Vitamin D deficiency 02/19/2013  . Seizure (HCC) 06/13/2012  . Hepatitis C 06/10/2012  . Normocytic anemia 06/10/2012  . T2_NIDDM Marikay Alar 06/10/2012  . Mixed hyperlipidemia 06/10/2012  . Essential hypertension 06/10/2012    Screening Tests Immunization History  Administered Date(s) Administered  . Influenza Whole 10/29/2012  . Influenza, High Dose Seasonal PF 11/23/2013, 12/20/2014, 11/26/2016  . Pneumococcal Conjugate-13 04/30/2016  . Pneumococcal Polysaccharide-23 07/15/2008    Preventative care:  Last colonoscopy: declines Last mammogram: declines Last pap smear/pelvic exam: declines   DEXA:declines  Prior vaccinations: TD or Tdap: declines  Influenza: 2017 Pneumococcal: 2010 Prevnar 13: declines Shingles/Zostavax: declines.   Names of Other Physician/Practitioners you currently use: 1. [facility]- here  for primary care Patient Care Team: Lucky Cowboy, MD as PCP - General (Internal Medicine) Sallye Lat, MD as Consulting Physician (Optometry) Sharrell Ku, MD as Consulting Physician (Gastroenterology) Marcene Corning, MD as Consulting Physician (Orthopedic Surgery) Brooke Dare, MD as Consulting Physician (Gastroenterology) Quentin Mulling, PA-C as Referring Physician (Physician Assistant)  Allergies No Known Allergies  SURGICAL HISTORY She  has a past surgical history that includes Cataract extraction (Right); Eye surgery; Corneal  transplant (Right); and Abdominal hysterectomy. FAMILY HISTORY Her family history includes Dementia in her brother, sister, sister, sister, and sister; Heart attack in her father and mother; Hypertension in her father and mother. SOCIAL HISTORY She  reports that she has never smoked. She has quit using smokeless tobacco. Her smokeless tobacco use included snuff. She reports that she does not drink alcohol or use drugs.   Objective:   Blood pressure 120/68, pulse 78, temperature 97.7 F (36.5 C), resp. rate 14, height  (1.6 m), weight 121 lb (54.9 kg), SpO2 99 %. Body mass index is 21.43 kg/m.  General appearance: alert, no distress, WD/WN,  female HEENT: normocephalic, sclerae anicteric, TMs pearly, nares patent, no discharge or erythema, pharynx normal Oral cavity: MMM, no lesions, dentures Neck: supple, no lymphadenopathy, no thyromegaly, no masses Heart: RRR, normal S1, S2 Lungs: rhonchi left lung, no wheezes, rhonchi, or rales Abdomen: +bs, soft, non tender, non distended, no masses, no hepatomegaly, no splenomegaly Musculoskeletal: nontender, no swelling, no obvious deformity Extremities: no edema, no cyanosis, no clubbing Pulses: 2+ symmetric, upper and lower extremities, normal cap refill Neurological: alert, oriented x 1, not able to appropriately follow directions, CN2-12 intact, strength decreased upper extremities and lower extremities, sensation decreased throughout bilateral feet, unsteady gait, walks with walker Psychiatric: normal affect, behavior normal, pleasant  Breast: defer Gyn: defer Rectal: defer  EKG declines   Quentin Mulling, PA-C   06/14/2017

## 2017-06-14 ENCOUNTER — Ambulatory Visit (HOSPITAL_COMMUNITY)
Admission: RE | Admit: 2017-06-14 | Discharge: 2017-06-14 | Disposition: A | Discharge status: Home / Self Care | Payer: Medicare Other | Source: Ambulatory Visit | Attending: Physician Assistant | Admitting: Physician Assistant

## 2017-06-14 ENCOUNTER — Ambulatory Visit (INDEPENDENT_AMBULATORY_CARE_PROVIDER_SITE_OTHER): Payer: Medicare Other | Admitting: Physician Assistant

## 2017-06-14 ENCOUNTER — Encounter: Payer: Self-pay | Admitting: Physician Assistant

## 2017-06-14 VITALS — BP 120/68 | HR 78 | Temp 97.7°F | Resp 14 | Ht 63.0 in | Wt 121.0 lb

## 2017-06-14 DIAGNOSIS — I10 Essential (primary) hypertension: Secondary | ICD-10-CM | POA: Diagnosis not present

## 2017-06-14 DIAGNOSIS — R350 Frequency of micturition: Secondary | ICD-10-CM | POA: Diagnosis not present

## 2017-06-14 DIAGNOSIS — E1122 Type 2 diabetes mellitus with diabetic chronic kidney disease: Secondary | ICD-10-CM | POA: Diagnosis not present

## 2017-06-14 DIAGNOSIS — D649 Anemia, unspecified: Secondary | ICD-10-CM

## 2017-06-14 DIAGNOSIS — E1121 Type 2 diabetes mellitus with diabetic nephropathy: Secondary | ICD-10-CM | POA: Diagnosis not present

## 2017-06-14 DIAGNOSIS — Z0000 Encounter for general adult medical examination without abnormal findings: Secondary | ICD-10-CM

## 2017-06-14 DIAGNOSIS — Z7982 Long term (current) use of aspirin: Secondary | ICD-10-CM | POA: Diagnosis not present

## 2017-06-14 DIAGNOSIS — N183 Chronic kidney disease, stage 3 (moderate): Secondary | ICD-10-CM | POA: Diagnosis not present

## 2017-06-14 DIAGNOSIS — G301 Alzheimer's disease with late onset: Secondary | ICD-10-CM

## 2017-06-14 DIAGNOSIS — E039 Hypothyroidism, unspecified: Secondary | ICD-10-CM | POA: Diagnosis not present

## 2017-06-14 DIAGNOSIS — J189 Pneumonia, unspecified organism: Secondary | ICD-10-CM | POA: Diagnosis not present

## 2017-06-14 DIAGNOSIS — B1920 Unspecified viral hepatitis C without hepatic coma: Secondary | ICD-10-CM

## 2017-06-14 DIAGNOSIS — E782 Mixed hyperlipidemia: Secondary | ICD-10-CM

## 2017-06-14 DIAGNOSIS — E441 Mild protein-calorie malnutrition: Secondary | ICD-10-CM

## 2017-06-14 DIAGNOSIS — Z09 Encounter for follow-up examination after completed treatment for conditions other than malignant neoplasm: Secondary | ICD-10-CM | POA: Diagnosis not present

## 2017-06-14 DIAGNOSIS — F02818 Dementia in other diseases classified elsewhere, unspecified severity, with other behavioral disturbance: Secondary | ICD-10-CM

## 2017-06-14 DIAGNOSIS — E559 Vitamin D deficiency, unspecified: Secondary | ICD-10-CM

## 2017-06-14 DIAGNOSIS — Z0001 Encounter for general adult medical examination with abnormal findings: Secondary | ICD-10-CM

## 2017-06-14 DIAGNOSIS — Z79899 Other long term (current) drug therapy: Secondary | ICD-10-CM

## 2017-06-14 DIAGNOSIS — Z9181 History of falling: Secondary | ICD-10-CM | POA: Diagnosis not present

## 2017-06-14 DIAGNOSIS — F0281 Dementia in other diseases classified elsewhere with behavioral disturbance: Secondary | ICD-10-CM

## 2017-06-14 DIAGNOSIS — R569 Unspecified convulsions: Secondary | ICD-10-CM

## 2017-06-14 DIAGNOSIS — Z79891 Long term (current) use of opiate analgesic: Secondary | ICD-10-CM | POA: Diagnosis not present

## 2017-06-14 DIAGNOSIS — I700 Atherosclerosis of aorta: Secondary | ICD-10-CM | POA: Diagnosis not present

## 2017-06-14 DIAGNOSIS — R1310 Dysphagia, unspecified: Secondary | ICD-10-CM | POA: Diagnosis not present

## 2017-06-14 LAB — URINALYSIS, ROUTINE W REFLEX MICROSCOPIC
BILIRUBIN URINE: NEGATIVE
Bacteria, UA: NONE SEEN /HPF
Hgb urine dipstick: NEGATIVE
Hyaline Cast: NONE SEEN /LPF
Ketones, ur: NEGATIVE
NITRITE: NEGATIVE
PH: 5.5 (ref 5.0–8.0)
Protein, ur: NEGATIVE
RBC / HPF: NONE SEEN /HPF (ref 0–2)
SQUAMOUS EPITHELIAL / LPF: NONE SEEN /HPF (ref <=5)
Specific Gravity, Urine: 1.021 (ref 1.001–1.03)

## 2017-06-14 LAB — CBC WITH DIFFERENTIAL/PLATELET
BASOS ABS: 12 {cells}/uL (ref 0–200)
Basophils Relative: 0.1 %
EOS ABS: 0 {cells}/uL — AB (ref 15–500)
EOS PCT: 0 %
HEMATOCRIT: 34.9 % — AB (ref 35.0–45.0)
Hemoglobin: 12.2 g/dL (ref 11.7–15.5)
Lymphs Abs: 1404 cells/uL (ref 850–3900)
MCH: 33.3 pg — ABNORMAL HIGH (ref 27.0–33.0)
MCHC: 35 g/dL (ref 32.0–36.0)
MCV: 95.4 fL (ref 80.0–100.0)
MPV: 10.8 fL (ref 7.5–12.5)
Monocytes Relative: 4.9 %
NEUTROS PCT: 83 %
Neutro Abs: 9711 cells/uL — ABNORMAL HIGH (ref 1500–7800)
Platelets: 192 10*3/uL (ref 140–400)
RBC: 3.66 10*6/uL — ABNORMAL LOW (ref 3.80–5.10)
RDW: 12.2 % (ref 11.0–15.0)
Total Lymphocyte: 12 %
WBC mixed population: 573 cells/uL (ref 200–950)
WBC: 11.7 10*3/uL — ABNORMAL HIGH (ref 3.8–10.8)

## 2017-06-14 LAB — TSH: TSH: 0.64 mIU/L (ref 0.40–4.50)

## 2017-06-14 MED ORDER — PREDNISONE 20 MG PO TABS: ORAL_TABLET | ORAL | 0 refills | Status: DC

## 2017-06-15 LAB — COMPLETE METABOLIC PANEL WITH GFR
AG RATIO: 0.8 (calc) — AB (ref 1.0–2.5)
ALT: 27 U/L (ref 6–29)
AST: 33 U/L (ref 10–35)
Albumin: 3.6 g/dL (ref 3.6–5.1)
Alkaline phosphatase (APISO): 67 U/L (ref 33–130)
BILIRUBIN TOTAL: 0.4 mg/dL (ref 0.2–1.2)
BUN/Creatinine Ratio: 39 (calc) — ABNORMAL HIGH (ref 6–22)
BUN: 44 mg/dL — ABNORMAL HIGH (ref 7–25)
CALCIUM: 10 mg/dL (ref 8.6–10.4)
CHLORIDE: 97 mmol/L — AB (ref 98–110)
CO2: 27 mmol/L (ref 20–32)
Creat: 1.12 mg/dL — ABNORMAL HIGH (ref 0.60–0.88)
GFR, EST AFRICAN AMERICAN: 49 mL/min/{1.73_m2} — AB (ref >=60)
GFR, Est Non African American: 42 mL/min/{1.73_m2} — ABNORMAL LOW (ref >=60)
GLOBULIN: 4.8 g/dL — AB (ref 1.9–3.7)
Glucose, Bld: 456 mg/dL — ABNORMAL HIGH (ref 65–99)
POTASSIUM: 4.5 mmol/L (ref 3.5–5.3)
SODIUM: 133 mmol/L — AB (ref 135–146)
Total Protein: 8.4 g/dL — ABNORMAL HIGH (ref 6.1–8.1)

## 2017-06-15 LAB — URINE CULTURE
MICRO NUMBER:: 90545189
Result:: NO GROWTH
SPECIMEN QUALITY: ADEQUATE

## 2017-06-15 LAB — LIPID PANEL
CHOL/HDL RATIO: 4.3 (calc) (ref <5.0)
CHOLESTEROL: 226 mg/dL — AB (ref <200)
HDL: 53 mg/dL (ref >50)
LDL Cholesterol (Calc): 146 mg/dL (calc) — ABNORMAL HIGH
NON-HDL CHOLESTEROL (CALC): 173 mg/dL — AB (ref <130)
Triglycerides: 144 mg/dL (ref <150)

## 2017-06-15 LAB — HEMOGLOBIN A1C
HEMOGLOBIN A1C: 8.1 %{Hb} — AB (ref <5.7)
Mean Plasma Glucose: 186 (calc)
eAG (mmol/L): 10.3 (calc)

## 2017-06-17 ENCOUNTER — Encounter: Payer: Self-pay | Admitting: Physician Assistant

## 2017-06-17 ENCOUNTER — Other Ambulatory Visit: Payer: Self-pay

## 2017-06-17 ENCOUNTER — Other Ambulatory Visit: Payer: Self-pay | Admitting: Internal Medicine

## 2017-06-17 MED ORDER — AZITHROMYCIN 250 MG PO TABS: ORAL_TABLET | ORAL | 1 refills | Status: AC

## 2017-06-17 MED ORDER — GLIPIZIDE 5 MG PO TABS: 2.5000 mg | ORAL_TABLET | Freq: Two times a day (BID) | ORAL | 11 refills | Status: DC

## 2017-06-17 MED ORDER — BLOOD GLUCOSE METER KIT: PACK | 0 refills | Status: AC

## 2017-06-17 NOTE — Addendum Note (Signed)
Addended by: Quentin Mulling R on: 06/17/2017 09:06 AM   Modules accepted: Orders

## 2017-06-21 DIAGNOSIS — Z961 Presence of intraocular lens: Secondary | ICD-10-CM | POA: Diagnosis not present

## 2017-06-21 DIAGNOSIS — H401133 Primary open-angle glaucoma, bilateral, severe stage: Secondary | ICD-10-CM | POA: Diagnosis not present

## 2017-06-21 DIAGNOSIS — H1851 Endothelial corneal dystrophy: Secondary | ICD-10-CM | POA: Diagnosis not present

## 2017-06-21 DIAGNOSIS — H10413 Chronic giant papillary conjunctivitis, bilateral: Secondary | ICD-10-CM | POA: Diagnosis not present

## 2017-06-26 ENCOUNTER — Other Ambulatory Visit: Payer: Self-pay

## 2017-06-26 ENCOUNTER — Emergency Department (HOSPITAL_COMMUNITY): Payer: Medicare Other

## 2017-06-26 ENCOUNTER — Encounter (HOSPITAL_COMMUNITY): Payer: Self-pay

## 2017-06-26 ENCOUNTER — Emergency Department (HOSPITAL_COMMUNITY)
Admission: EM | Admit: 2017-06-26 | Discharge: 2017-06-26 | Disposition: A | Discharge status: Home / Self Care | Payer: Medicare Other | Attending: Emergency Medicine | Admitting: Emergency Medicine

## 2017-06-26 DIAGNOSIS — I10 Essential (primary) hypertension: Secondary | ICD-10-CM | POA: Diagnosis not present

## 2017-06-26 DIAGNOSIS — Z79899 Other long term (current) drug therapy: Secondary | ICD-10-CM | POA: Insufficient documentation

## 2017-06-26 DIAGNOSIS — R079 Chest pain, unspecified: Secondary | ICD-10-CM | POA: Diagnosis not present

## 2017-06-26 DIAGNOSIS — Z7982 Long term (current) use of aspirin: Secondary | ICD-10-CM | POA: Diagnosis not present

## 2017-06-26 DIAGNOSIS — Z7984 Long term (current) use of oral hypoglycemic drugs: Secondary | ICD-10-CM | POA: Insufficient documentation

## 2017-06-26 DIAGNOSIS — R9431 Abnormal electrocardiogram [ECG] [EKG]: Secondary | ICD-10-CM | POA: Diagnosis not present

## 2017-06-26 DIAGNOSIS — E119 Type 2 diabetes mellitus without complications: Secondary | ICD-10-CM | POA: Insufficient documentation

## 2017-06-26 DIAGNOSIS — E039 Hypothyroidism, unspecified: Secondary | ICD-10-CM | POA: Insufficient documentation

## 2017-06-26 DIAGNOSIS — R001 Bradycardia, unspecified: Secondary | ICD-10-CM | POA: Diagnosis not present

## 2017-06-26 LAB — CBC
HCT: 36.8 % (ref 36.0–46.0)
Hemoglobin: 12.2 g/dL (ref 12.0–15.0)
MCH: 32.5 pg (ref 26.0–34.0)
MCHC: 33.2 g/dL (ref 30.0–36.0)
MCV: 98.1 fL (ref 78.0–100.0)
PLATELETS: 172 10*3/uL (ref 150–400)
RBC: 3.75 MIL/uL — ABNORMAL LOW (ref 3.87–5.11)
RDW: 12.5 % (ref 11.5–15.5)
WBC: 10.7 10*3/uL — ABNORMAL HIGH (ref 4.0–10.5)

## 2017-06-26 LAB — BASIC METABOLIC PANEL
Anion gap: 7 (ref 5–15)
BUN: 27 mg/dL — AB (ref 6–20)
CHLORIDE: 103 mmol/L (ref 101–111)
CO2: 28 mmol/L (ref 22–32)
CREATININE: 0.94 mg/dL (ref 0.44–1.00)
Calcium: 9.2 mg/dL (ref 8.9–10.3)
GFR calc Af Amer: 59 mL/min — ABNORMAL LOW (ref >60)
GFR calc non Af Amer: 51 mL/min — ABNORMAL LOW (ref >60)
Glucose, Bld: 346 mg/dL — ABNORMAL HIGH (ref 65–99)
Potassium: 3.3 mmol/L — ABNORMAL LOW (ref 3.5–5.1)
Sodium: 138 mmol/L (ref 135–145)

## 2017-06-26 LAB — I-STAT TROPONIN, ED: Troponin i, poc: 0.01 ng/mL (ref 0.00–0.08)

## 2017-06-26 NOTE — Care Management Note (Addendum)
Case Management Note  Patient Details  Name: [name] MRN: [id number] Date of Birth: 04/06/1922  Subjective/Objective:                  CP; SOB  Action/Plan: EDCM spoke with the patient and her daughters at the bedside. The patient's daughters requested assistance with a Palliative Care consult. They report Palliative Care was discussed with the patient's PCP but they have not been contacted to schedule an assessment. EDCM contacted [facility] and spoke with Victorino Dike RN on-call. Victorino Dike states  the patient/family to call the referral center tomorrow between 8:30-5p, request a home visit for a Palliative Care assessment and inform the referral center she has an order. CM spoke with the patient's daughters [name] is her primary caregiver) and provided her with a [facility] brochure. Explained the need to contact the referral center tomorrow, state the patient was in the [facility] ED today and has an order for a Palliative Care assessment. The patient's daughters verbalize understanding. [name] states she lives with her mother. She reports the patient has a cane, walker, wheelchair and BSC. They think she may need a hospital bed. Discussed requesting assistance for a hospital bed thru [facility] if accepted or the patient's PCP if not admitted to Palliative Care. They both are agreeable. The patient's daughter asked if respite care is available thru [facility]. Encouraged to discuss services offered during the assessment. The patient has no difficulty affording her medications. She takes 2 prescription medications per [name]. [name] drives her to appointments.   4 - [facility] faxed order with face-to-face to [facility]. Fax confirmation received.   Expected Discharge Date:     06/26/17             Expected Discharge Plan:  Home w Home Health Services  In-House Referral:     Discharge planning Services  CM Consult  Post Acute Care Choice:    Choice offered to:  Adult Children  DME Arranged:    DME Agency:      HH Arranged:  RN HH Agency:  [facility]  Status of Service:  Completed, signed off  If discussed at Long Length of Stay Meetings, dates discussed:    Additional Comments:  Antony Haste, RN 06/26/2017, 6:44 PM

## 2017-06-26 NOTE — ED Notes (Addendum)
This EMT spoke with family member with regards to patient potential pain level. Family member stated pt had "chest pains earlier and was burping a lot", and pt was presenting with gasping breaths earlier today. Pt appears to be currently at baseline per family.

## 2017-06-26 NOTE — ED Triage Notes (Signed)
Pt here with her daughter, daughter reports she has been complaining of chest pain and "breathing funny" all morning. Pt in no distress but moans when she is asked questions. She has hx of dementia with hallucinations per daughter.

## 2017-06-26 NOTE — ED Provider Notes (Signed)
[facility] EMERGENCY DEPARTMENT Provider Note   CSN: [id number] Arrival date & time: 06/26/17  1451     History   Chief Complaint Chief Complaint  Patient presents with  . Chest Pain    HPI [name] is a 84 y.o. female.  The history is provided by a relative and a caregiver.   Patient is a 84 year old female with medical history as below presents with her daughters due to concerns about her complaint of chest pain over the last week.  The patient has dementia and currently denies any complaints.  The history is thus limited from the patient and the remainder of history is obtained from the family.  Her daughter lives with her and assist in her care.  She reports the patient was recently treated for pneumonia with antibiotics and then treated again with steroids.  These treatments ended last week.  They report her cough got better.  She has not had any fever.  However, she has complained several times over the last week about chest pain.  They are also concerned because they feel the patient would not want to be coming back and forth to the hospital.  They have requested palliative care consultation from the PCP.  However, they have not yet been contacted about this.  The family is primarily concerned today about possible recurrent pneumonia.  Past Medical History:  Diagnosis Date  . Allergy    "all year round" (05/16/2017)  . Alzheimer disease 12/11/2016   "not real bad; developing slow" (05/16/2017)  . Arthritis    "knees" (05/16/2017)  . Depression   . DJD (degenerative joint disease)   . GERD (gastroesophageal reflux disease)   . Glaucoma   . Hepatitis C   . Hyperlipidemia   . Hypertension   . Hypothyroidism   . Pneumonia ~ 2016; 05/2017  . Seizures ([place]) ~ 2013X 1   "she was real sick and admitted to hospital; had sz at home"  . Type II diabetes mellitus ([place])   . Vitamin D deficiency     Patient Active Problem List   Diagnosis Date Noted  . Mild  malnutrition ([place]) 05/15/2017  . PNA (pneumonia) 05/15/2017  . Alzheimer disease 12/11/2016  . Encounter for Medicare annual wellness exam 09/08/2014  . Knee pain, bilateral 06/14/2014  . Hypothyroidism 02/23/2014  . Medication management 08/20/2013  . Vitamin D deficiency 02/19/2013  . Seizure ([place]) 06/13/2012  . Hepatitis C 06/10/2012  . Normocytic anemia 06/10/2012  . T2_NIDDM Rosalee Kaufman 06/10/2012  . Mixed hyperlipidemia 06/10/2012  . Essential hypertension 06/10/2012    Past Surgical History:  Procedure Laterality Date  . ABDOMINAL HYSTERECTOMY    . CATARACT EXTRACTION Right   . CORNEAL TRANSPLANT Right    "attempted; didn't work"  . EYE SURGERY       OB History   None      Home Medications    Prior to Admission medications   Medication Sig Start Date End Date Taking? Authorizing Provider  aspirin EC 81 MG tablet Take 81 mg by mouth daily.    [provider]  Black Currant Seed Oil 500 MG CAPS Take 1 capsule by mouth 2 (two) times daily.    [provider]  blood glucose meter kit and supplies Check sugars 1 daily Dispense based insurance preference. E11.9 06/17/17   Vicie Mutters, PA-C  cetirizine (ZYRTEC) 10 MG tablet Take 10 mg by mouth daily.    [provider]  clotrimazole-betamethasone (LOTRISONE) cream  Apply to affected area 2 times daily 11/20/16 11/20/17  Vicie Mutters, PA-C  doxycycline (VIBRA-TABS) 100 MG tablet Take 1 tablet (100 mg total) by mouth every 12 (twelve) hours. 05/16/17   Caren Griffins, MD  glipiZIDE (GLUCOTROL) 5 MG tablet Take 0.5 tablets (2.5 mg total) by mouth 2 (two) times daily before a meal. 06/17/17   Vicie Mutters, PA-C  levothyroxine (SYNTHROID, LEVOTHROID) 50 MCG tablet TAKE 1 TABLET BY MOUTH  DAILY 06/17/17   Vicie Mutters, PA-C  Multiple Vitamin (MULTI-VITAMIN DAILY PO) Take by mouth.    [provider]  NON FORMULARY Apply 1 application topically See admin instructions. Horse linament  As needed     [provider]  Polyethylene Glycol 3350 (MIRALAX PO) Take by mouth.    [provider]  predniSONE (DELTASONE) 20 MG tablet 2 tablets daily for 3 days, 1 tablet daily for 4 days. 06/14/17   Vicie Mutters, PA-C  silver sulfADIAZINE (SILVADENE) 1 % cream APPLY 1 APPLICATION TOPICALLY DAILY. 05/08/17   Vicie Mutters, PA-C  traZODone (DESYREL) 50 MG tablet 1/2-1 tablet for sleep - start with lower dose and only increase if needed. 05/20/17   Liane Comber, NP    Family History Family History  Problem Relation Age of Onset  . Hypertension Mother   . Heart attack Mother   . Hypertension Father   . Heart attack Father   . Dementia Sister   . Dementia Sister   . Dementia Sister   . Dementia Sister   . Dementia Brother     Social History Social History   Tobacco Use  . Smoking status: Never Smoker  . Smokeless tobacco: Former Systems developer    Types: Snuff  Substance Use Topics  . Alcohol use: No  . Drug use: No     Allergies   Patient has no known allergies.   Review of Systems Review of Systems  Unable to perform ROS: Dementia     Physical Exam Updated Vital Signs BP (!) 149/73   Pulse (!) 54   Temp 98.2 F (36.8 C) (Oral)   Resp 18   Ht '5\' 3"'  (1.6 m)   Wt 54.9 kg (121 lb)   SpO2 95%   BMI 21.43 kg/m   Physical Exam  Constitutional: She appears well-developed. No distress.  Chronically ill-appearing  HENT:  Head: Normocephalic and atraumatic.  Eyes: Conjunctivae are normal.  Neck: Neck supple.  Cardiovascular: Normal rate and regular rhythm.  No murmur heard. Pulmonary/Chest: Effort normal and breath sounds normal. No respiratory distress.  Abdominal: Soft. There is no tenderness.  Musculoskeletal: She exhibits no edema.  Neurological: She is alert.  Alert but oriented to name only.   Skin: Skin is warm and dry.  Psychiatric: She has a normal mood and affect.  Nursing note and vitals reviewed.    ED Treatments / Results  Labs (all  labs ordered are listed, but only abnormal results are displayed) Labs Reviewed  BASIC METABOLIC PANEL - Abnormal; Notable for the following components:      Result Value   Potassium 3.3 (*)    Glucose, Bld 346 (*)    BUN 27 (*)    GFR calc non Af Amer 51 (*)    GFR calc Af Amer 59 (*)    All other components within normal limits  CBC - Abnormal; Notable for the following components:   WBC 10.7 (*)    RBC 3.75 (*)    All other components within normal limits  I-STAT TROPONIN, ED    EKG EKG Interpretation  Date/Time:  Wednesday Jun 26 2017 14:58:36 EDT Ventricular Rate:  59 PR Interval:  186 QRS Duration: 88 QT Interval:  380 QTC Calculation: 376 R Axis:   -17 Text Interpretation:  Sinus bradycardia with sinus arrhythmia Left ventricular hypertrophy with repolarization abnormality Nonspecific ST abnormality Abnormal ECG Baseline wander Confirmed by Quintella Reichert [id number]) on 06/26/2017 6:17:59 PM   Radiology Dg Chest 2 View  Result Date: 06/26/2017 CLINICAL DATA:  Altered mental status. EXAM: CHEST - 2 VIEW COMPARISON:  Radiographs of Jun 14, 2017. FINDINGS: Stable cardiomediastinal silhouette. No pneumothorax or pleural effusion is noted. Stable left basilar interstitial densities are noted most consistent with scarring. No acute pulmonary disease is noted. Bony thorax is unremarkable. IMPRESSION: No active cardiopulmonary disease. Electronically Signed   By: Marijo Conception, M.D.   On: 06/26/2017 16:06    Procedures Procedures (including critical care time)  Medications Ordered in ED Medications - No data to display   Initial Impression / Assessment and Plan / ED Course  I have reviewed the triage vital signs and the nursing notes.  Pertinent labs & imaging results that were available during my care of the patient were reviewed by me and considered in my medical decision making (see chart for details).    Patient is a 84 year old female with history as above who  presents due to complaining of chest pain off and on for the last week.  She is here with 2 of her daughters.  1 of these daughters lives with her.  The daughters report they were primarily concerned about the patient having a recurrent pneumonia given her recent treatment for pneumonia.  Her cough actually improved after the treatment.  The patient is unable to give me any more detail about her chest pain here given her dementia.  She denies any pain or other complaints at this time.  She states that she would like to go home.  The family reports they are trying to set up palliative care at home however they have not been able to establish contact with them.  I spoke to our social worker who has assisted the patient's family and given them resources to call tomorrow to set up an in-home consultation.  Based on discussion with the family, it sounds as if the patient would not want to be admitted to the hospital or having frequent contact with the medical system at this point.  Her labs and x-ray were reviewed as above.  Very mild hypokalemia noted.  Mild leukocytosis noted.  Chest x-ray shows no evidence of pneumonia.  She does not appear to have any emergent medical condition at this time.  I feel she is stable for discharge home.  It also sounds like the family is moving toward comfort care.  Return precautions were discussed in detail.  Patient discharged in stable condition.  Final Clinical Impressions(s) / ED Diagnoses   Final diagnoses:  Chest pain, unspecified type    ED Discharge Orders    None       Clifton James, MD 06/26/17 2345    Quintella Reichert, MD 06/27/17 [phone]

## 2017-06-28 ENCOUNTER — Other Ambulatory Visit: Payer: Medicare Other | Admitting: Internal Medicine

## 2017-06-28 ENCOUNTER — Telehealth: Payer: Self-pay

## 2017-06-28 DIAGNOSIS — Z515 Encounter for palliative care: Secondary | ICD-10-CM

## 2017-06-28 NOTE — Telephone Encounter (Signed)
Phone call placed to patient to offer to schedule visit with Palliative Care. Scheduled for today.

## 2017-06-30 ENCOUNTER — Encounter: Payer: Self-pay | Admitting: Internal Medicine

## 2017-06-30 NOTE — Progress Notes (Signed)
06/28/2017  PALLIATIVE CARE CONSULT [name]. DOB: Oct 06, 1922. [address], [place] [address] [phone]) [phone].  REFERRING PROVIDER: Dr, [name] (last OV 06/14/2017) [facility] [address]  provides 2 hours of aide assist M-F;  SN qwk  (Contact: [name]). [facility] for ST/PT services; signed off last week. Family: Patient shares home with her daughter [name]) [name] [phone]. [name] (daughter) [phone]  BILLABLE  ICD-10: Dementia with behavioral disturbances (FO3.91) IMPRESSION/RECOMMENDATIONS: 1. Dementia, FAST 6E. Patient is chatty, with rambling/confused speech but able to follow cuing instructions. Almost constantly hums, or calls out. Answers some simple questions appropriately. Oriented to self.  Totally dependent in hygiene; incontinent bowel/bladder.  Needs to be fed; eating total 1  cups pureed food /snacks and a protein drink/d. Sleeps majority of the day. 2. Stage 2 pressure injury 0.5cm left upper buttocks; not inflamed or infected. Managed with barrier cream/open to air.  3. Gait instability r/t  dementia, weakness. Progressive over this last year (last summer able to walk laps independently at the gym). Able to push herself up from high position of lift chair; ambulates with 1 person guidance, cuing, and assist. Larey Seat 1 month earlier setting of pneumonia. 4. Advanced Care Planning: -Reviewed DNR/MOST advanced directives with PCG/HCPA daughter [name]. Confirmed DNR;  two forms completed and left in the home. Advised to keep one form on the fridge; carry other form with her when she leaves the home. MOST forms reviewed ,completed, and signed. Details: No CPR; Medical Interventions limited to Comfort Measures. No antibiotics, no IVFs, no feeding tube. Form left in home with daughter, along with written  education material about these advanced care directives.   I spent 75 minutes providing this consultation  (from 10am to 11:15am). More than 50% of that time was spent coordinating communication.  HPI: 84 yo AA female with with h/o dementia, and DM. Over the last few months she was hospitalized for pneumonia, and more recently had an ER evaluation for non-cardiac chest pain. Patient's daughter [name] requested Palliative Care consult, with the expectation that we could provide all patient's primary care needs at home so that patient wouldn't need to travel to physician's office. [name] mentioned she wants to avoid any future hospitalizations, for her mom. I clarified to [name] that though Palliative Care can assist her primary care with home symptom management, we can't act as her mom's  primary care provider.  CODE STATUS:  DNR; MOST: comfort care only; no IVFs/Antibiotics/tube feedings. PPS: 40%. HOSPICE ELIGIBILITY:  TBD, though PCG daughter [name] doesn't wish to consider; had a past negative experience with hospice with another relative.  MEDICATIONS: Aspirin qd, Black Currant Seed Oil  bid, Zyrtec  qd, glipizide,  tab bid, synthroid qd, MiraLax 17g q3d, Trazodone  -1 tab qhs, Vibra-Tabs  bid ALLERGIES: NKDA  PAST MEDICAL HISTORY: Seasonal allergies, Alzheimer's disease, arthritis, depression, DJD, GERD, Glaucoma, Hepatitis C, Hyperlipidemia, HTN, hypothyroidism, pneumonia (recently; April 2019), seizures, DM type II (checks BS qd; goal <250), Vitamin D deficiency Recent ER visit (06/26/2017) for CP; felt non-cardiac  PHYSICAL EXAM: Slender, elderly AA female lying supine in recliner with blanket pulled up over her head.  VS: BP 112/50, HR 60, sats 99%, RR 16 HEENT: [place], AT LUNGS:  CTA bilaterally CARDIAC: RRR without MRG ABD: soft, non-distended, NABS EXTREMITIES: no pedal edema MUSCULOSKELETAL: no contractures SKIN: stage 2 left upper buttocks 0.5cm no signs inflammation/infection NEURO: A & O to self  Holly Bodily NP-C

## 2017-07-01 ENCOUNTER — Telehealth: Payer: Self-pay | Admitting: Internal Medicine

## 2017-07-01 NOTE — Telephone Encounter (Signed)
Meds already updated & refilled by provider.

## 2017-07-01 NOTE — Telephone Encounter (Signed)
Family called to cancel hospital follow up and to advise she is declining Palliative Care with Hospice. She can manage patinet herself. States she Wanted home doctor visits.   Family states patient takes Trazadone   1 nightly at 7pm for sleep. Provider, Quentin Mulling,  requested that we confirm patient's current dosing on Trazadone at follow up visit.

## 2017-07-01 NOTE — Progress Notes (Deleted)
Assessment and Plan:  There are no diagnoses linked to this encounter.    Further disposition pending results of labs. Discussed med's effects and SE's.   Over 30 minutes of exam, counseling, chart review, and critical decision making was performed.   Future Appointments  Date Time Provider [place]  07/02/2017  3:45 PM Liane Comber, NP [facility] None  10/02/2017  2:30 PM Unk Pinto, MD [facility] None  06/23/2018  2:00 PM Vicie Mutters, PA-C [facility] None    ------------------------------------------------------------------------------------------------------------------   HPI There were no vitals taken for this visit.  84 y.o.female with hx of dementia presents for follow up after she presented to the ED for evaluation of intermittent chest pain with concern for recurrent pneumonia. EKG, troponin negative, CXR did not show pneumonia, but she demonstrated very mild leukocytosis (10.7 - improved from recent previous) and hypokalemia (3.3) were noted. Family requested palliative care referral, and in home consultation was arranged by social care worker which was completed on 06/28/2017. They discussed goals of care and completed DNR and MOST form     Past Medical History:  Diagnosis Date  . Allergy    "all year round" (05/16/2017)  . Alzheimer disease 12/11/2016   "not real bad; developing slow" (05/16/2017)  . Arthritis    "knees" (05/16/2017)  . Depression   . DJD (degenerative joint disease)   . GERD (gastroesophageal reflux disease)   . Glaucoma   . Hepatitis C   . Hyperlipidemia   . Hypertension   . Hypothyroidism   . Pneumonia ~ 2016; 05/2017  . Seizures ([place]) ~ 2013X 1   "she was real sick and admitted to hospital; had sz at home"  . Type II diabetes mellitus ([place])   . Vitamin D deficiency      No Known Allergies  Current Outpatient Medications on File Prior to Visit  Medication Sig  . aspirin EC 81 MG tablet Take 81 mg by mouth daily.  . Black  Currant Seed Oil 500 MG CAPS Take 1 capsule by mouth 2 (two) times daily.  . blood glucose meter kit and supplies Check sugars 1 daily Dispense based insurance preference. E11.9  . cetirizine (ZYRTEC) 10 MG tablet Take 10 mg by mouth daily.  . clotrimazole-betamethasone (LOTRISONE) cream Apply to affected area 2 times daily  . doxycycline (VIBRA-TABS) 100 MG tablet Take 1 tablet (100 mg total) by mouth every 12 (twelve) hours.  Marland Kitchen glipiZIDE (GLUCOTROL) 5 MG tablet Take 0.5 tablets (2.5 mg total) by mouth 2 (two) times daily before a meal.  . levothyroxine (SYNTHROID, LEVOTHROID) 50 MCG tablet TAKE 1 TABLET BY MOUTH  DAILY  . Multiple Vitamin (MULTI-VITAMIN DAILY PO) Take by mouth.  . NON FORMULARY Apply 1 application topically See admin instructions. Horse linament  As needed  . Polyethylene Glycol 3350 (MIRALAX PO) Take by mouth.  . predniSONE (DELTASONE) 20 MG tablet 2 tablets daily for 3 days, 1 tablet daily for 4 days.  . silver sulfADIAZINE (SILVADENE) 1 % cream APPLY 1 APPLICATION TOPICALLY DAILY.  . traZODone (DESYREL) 50 MG tablet 1/2-1 tablet for sleep - start with lower dose and only increase if needed.   No current facility-administered medications on file prior to visit.     ROS: all negative except above.   Physical Exam:  There were no vitals taken for this visit.  General Appearance: Well nourished, in no apparent distress. Eyes: PERRLA, EOMs, conjunctiva no swelling or erythema Sinuses: No Frontal/maxillary tenderness ENT/Mouth: Ext aud canals clear, TMs  without erythema, bulging. No erythema, swelling, or exudate on post pharynx.  Tonsils not swollen or erythematous. Hearing normal.  Neck: Supple, thyroid normal.  Respiratory: Respiratory effort normal, BS equal bilaterally without rales, rhonchi, wheezing or stridor.  Cardio: RRR with no MRGs. Brisk peripheral pulses without edema.  Abdomen: Soft, + BS.  Non tender, no guarding, rebound, hernias, masses. Lymphatics:  Non tender without lymphadenopathy.  Musculoskeletal: Full ROM, 5/5 strength, normal gait.  Skin: Warm, dry without rashes, lesions, ecchymosis.  Neuro: Cranial nerves intact. Normal muscle tone, no cerebellar symptoms. Sensation intact.  Psych: Awake and oriented X 3, normal affect, Insight and Judgment appropriate.     Izora Ribas, NP 1:50 PM [facility]

## 2017-07-02 ENCOUNTER — Ambulatory Visit: Payer: Self-pay | Admitting: Adult Health

## 2017-07-03 ENCOUNTER — Ambulatory Visit (INDEPENDENT_AMBULATORY_CARE_PROVIDER_SITE_OTHER): Payer: Medicare Other | Admitting: Adult Health

## 2017-07-03 ENCOUNTER — Encounter: Payer: Self-pay | Admitting: Adult Health

## 2017-07-03 VITALS — BP 110/58 | HR 67 | Temp 97.3°F | Ht 63.0 in | Wt 124.0 lb

## 2017-07-03 DIAGNOSIS — R21 Rash and other nonspecific skin eruption: Secondary | ICD-10-CM | POA: Diagnosis not present

## 2017-07-03 DIAGNOSIS — D72829 Elevated white blood cell count, unspecified: Secondary | ICD-10-CM

## 2017-07-03 DIAGNOSIS — R079 Chest pain, unspecified: Secondary | ICD-10-CM | POA: Diagnosis not present

## 2017-07-03 DIAGNOSIS — Z79899 Other long term (current) drug therapy: Secondary | ICD-10-CM

## 2017-07-03 DIAGNOSIS — E876 Hypokalemia: Secondary | ICD-10-CM | POA: Diagnosis not present

## 2017-07-03 NOTE — Progress Notes (Signed)
Assessment and Plan:  Chest pain ER workup negative for cardiac or pulmonary origin No further episodes/issues Follow up as needed  Leukocytosis, unspecified type -     CBC with Differential/Platelet  Hypokalemia -     BASIC METABOLIC PANEL WITH GFR  Rash and nonspecific skin eruption Upper buttock; Nonspecific; no signs of infection, does not appear as skin breakdown or ulceration. As this is improving with desodent and silver sulfadiazine will refill this and have her continue. Discussed prevention of skin breakdown, and to monitor for signs of infection or worsening and call office with concerns.   Further disposition pending results of labs. Discussed med's effects and SE's.   Over 15 minutes of exam, counseling, chart review, and critical decision making was performed.   Future Appointments  Date Time Provider [place]  10/02/2017  2:30 PM Unk Pinto, MD [facility] None  06/23/2018  2:00 PM Vicie Mutters, PA-C [facility] None    ------------------------------------------------------------------------------------------------------------------   HPI BP (!) 110/58   Pulse 67   Temp (!) 97.3 F (36.3 C)   Ht _0  (1.6 m)   Wt 124 lb (56.2 kg)   SpO2 95%   BMI 21.97 kg/m   84 y.o.female with alzheimer's dementia, fully dependent on caregivers and family, presents for evaluation of "spots" that appeared ~2 weeks on her buttock; she has recurrent similar problems previously and has been prescribed silver sulfadiazine and daughter has been applying desodent and areas have significantly improved in the past 2 weeks. Note 3 small scabs without erythema, discharge or signs of breakdown to left upper buttock near intergluteal cleft.   This is also a follow up after she presented to the ED for evaluation of intermittent chest pain with concern for recurrent pneumonia on 06/26/2017. EKG, troponin negative, CXR did not show pneumonia, but she demonstrated very mild  leukocytosis (10.7 - improved from recent previous) and hypokalemia (3.3) were noted. Family requested palliative care referral, and in home consultation was arranged by social care worker which was completed on 06/28/2017. They discussed goals of care and completed DNR and MOST form. Daughter reports home health will be visiting 5 days a week to assist with care.    Past Medical History:  Diagnosis Date  . Allergy    "all year round" (82/05/2017)  . Alzheimer disease 12/11/2016   "not real bad; developing slow" (82/05/2017)  . Arthritis    "knees" (82/05/2017)  . Depression   . DJD (degenerative joint disease)   . GERD (gastroesophageal reflux disease)   . Glaucoma   . Hepatitis C   . Hyperlipidemia   . Hypertension   . Hypothyroidism   . Pneumonia ~ 2016; 05/2017  . Seizures ([place]) ~ 2013X 1   "she was real sick and admitted to hospital; had sz at home"  . Type II diabetes mellitus ([place])   . Vitamin D deficiency      No Known Allergies  Current Outpatient Medications on File Prior to Visit  Medication Sig  . aspirin EC 81 MG tablet Take 81 mg by mouth daily.  . Black Currant Seed Oil 500 MG CAPS Take 1 capsule by mouth 2 (two) times daily.  . blood glucose meter kit and supplies Check sugars 1 daily Dispense based insurance preference. E11.9  . clotrimazole-betamethasone (LOTRISONE) cream Apply to affected area 2 times daily  . glipiZIDE (GLUCOTROL) 5 MG tablet Take 0.5 tablets (2.5 mg total) by mouth 2 (two) times daily before a meal.  . levothyroxine (SYNTHROID,  LEVOTHROID) 50 MCG tablet TAKE 1 TABLET BY MOUTH  DAILY  . loratadine (CLARITIN) 10 MG tablet Take 10 mg by mouth daily.  . Multiple Vitamin (MULTI-VITAMIN DAILY PO) Take by mouth.  . NON FORMULARY Apply 1 application topically See admin instructions. Horse linament  As needed  . Polyethylene Glycol 3350 (MIRALAX PO) Take by mouth.  . silver sulfADIAZINE (SILVADENE) 1 % cream APPLY 1 APPLICATION TOPICALLY DAILY.  .  traZODone (DESYREL) 50 MG tablet 1/2-1 tablet for sleep - start with lower dose and only increase if needed.  . cetirizine (ZYRTEC) 10 MG tablet Take 10 mg by mouth daily.  Marland Kitchen doxycycline (VIBRA-TABS) 100 MG tablet Take 1 tablet (100 mg total) by mouth every 12 (twelve) hours. (Patient not taking: Reported on 07/03/2017)  . predniSONE (DELTASONE) 20 MG tablet 2 tablets daily for 3 days, 1 tablet daily for 4 days. (Patient not taking: Reported on 07/03/2017)   No current facility-administered medications on file prior to visit.     ROS: all negative except above.   Physical Exam:  BP (!) 110/58   Pulse 67   Temp (!) 97.3 F (36.3 C)   Ht _0  (1.6 m)   Wt 124 lb (56.2 kg)   SpO2 95%   BMI 21.97 kg/m   General Appearance: Well nourished, in no apparent distress. Eyes: PERRLA, EOMs, conjunctiva no swelling or erythema Sinuses: No Frontal/maxillary tenderness ENT/Mouth: Ext aud canals clear, TMs without erythema, bulging. No erythema, swelling, or exudate on post pharynx.  Tonsils not swollen or erythematous. Hearing normal.  Neck: Supple.  Respiratory: Respiratory effort normal, BS equal bilaterally without rales, rhonchi, wheezing or stridor.  Cardio: RRR with no MRGs. Brisk peripheral pulses without edema.  Abdomen: Soft, + BS.  Non tender, no guarding, rebound, hernias, masses. Lymphatics: Non tender without lymphadenopathy.  Musculoskeletal: Slow gait with walker Skin: Warm, dry; 3 scabbed lesions each 0.5 cm each or so to left upper buttock near gluteal cleft; barrier cream present throughout; no signs of injection/discharge; no ulcerations or otherwise suggestive of skin breakdown  Psych: Awake and oriented X 1, labile affect, babbling/humming, Insight and Judgment poor, impulsive     Izora Ribas, NP 4:34 PM [facility]

## 2017-07-04 LAB — BASIC METABOLIC PANEL WITH GFR
BUN/Creatinine Ratio: 33 (calc) — ABNORMAL HIGH (ref 6–22)
BUN: 31 mg/dL — AB (ref 7–25)
CALCIUM: 9.3 mg/dL (ref 8.6–10.4)
CHLORIDE: 104 mmol/L (ref 98–110)
CO2: 29 mmol/L (ref 20–32)
Creat: 0.95 mg/dL — ABNORMAL HIGH (ref 0.60–0.88)
GFR, Est African American: 60 mL/min/{1.73_m2} (ref >=60)
GFR, Est Non African American: 52 mL/min/{1.73_m2} — ABNORMAL LOW (ref >=60)
GLUCOSE: 124 mg/dL — AB (ref 65–99)
POTASSIUM: 5.3 mmol/L (ref 3.5–5.3)
Sodium: 141 mmol/L (ref 135–146)

## 2017-07-04 LAB — CBC WITH DIFFERENTIAL/PLATELET
BASOS ABS: 22 {cells}/uL (ref 0–200)
Basophils Relative: 0.3 %
EOS ABS: 187 {cells}/uL (ref 15–500)
Eosinophils Relative: 2.6 %
HEMATOCRIT: 32.9 % — AB (ref 35.0–45.0)
HEMOGLOBIN: 11.3 g/dL — AB (ref 11.7–15.5)
LYMPHS ABS: 2981 {cells}/uL (ref 850–3900)
MCH: 32.7 pg (ref 27.0–33.0)
MCHC: 34.3 g/dL (ref 32.0–36.0)
MCV: 95.1 fL (ref 80.0–100.0)
MPV: 10.6 fL (ref 7.5–12.5)
Monocytes Relative: 9.9 %
NEUTROS PCT: 45.8 %
Neutro Abs: 3298 cells/uL (ref 1500–7800)
Platelets: 126 10*3/uL — ABNORMAL LOW (ref 140–400)
RBC: 3.46 10*6/uL — ABNORMAL LOW (ref 3.80–5.10)
RDW: 12.2 % (ref 11.0–15.0)
Total Lymphocyte: 41.4 %
WBC: 7.2 10*3/uL (ref 3.8–10.8)
WBCMIX: 713 {cells}/uL (ref 200–950)

## 2017-07-16 DIAGNOSIS — G8929 Other chronic pain: Secondary | ICD-10-CM | POA: Diagnosis not present

## 2017-07-16 DIAGNOSIS — Z0000 Encounter for general adult medical examination without abnormal findings: Secondary | ICD-10-CM | POA: Diagnosis not present

## 2017-07-16 DIAGNOSIS — E119 Type 2 diabetes mellitus without complications: Secondary | ICD-10-CM | POA: Diagnosis not present

## 2017-07-16 DIAGNOSIS — R451 Restlessness and agitation: Secondary | ICD-10-CM | POA: Diagnosis not present

## 2017-07-27 DIAGNOSIS — G8929 Other chronic pain: Secondary | ICD-10-CM | POA: Diagnosis not present

## 2017-07-27 DIAGNOSIS — E039 Hypothyroidism, unspecified: Secondary | ICD-10-CM | POA: Diagnosis not present

## 2017-08-12 ENCOUNTER — Other Ambulatory Visit: Payer: Self-pay | Admitting: *Deleted

## 2017-08-12 DIAGNOSIS — F02818 Dementia in other diseases classified elsewhere, unspecified severity, with other behavioral disturbance: Secondary | ICD-10-CM

## 2017-08-12 DIAGNOSIS — F0281 Dementia in other diseases classified elsewhere with behavioral disturbance: Secondary | ICD-10-CM

## 2017-08-12 DIAGNOSIS — G301 Alzheimer's disease with late onset: Principal | ICD-10-CM

## 2017-08-12 MED ORDER — GLIPIZIDE 5 MG PO TABS: 2.5000 mg | ORAL_TABLET | Freq: Two times a day (BID) | ORAL | 3 refills | Status: AC

## 2017-08-12 MED ORDER — TRAZODONE HCL 50 MG PO TABS: ORAL_TABLET | ORAL | 3 refills | Status: DC

## 2017-08-31 DIAGNOSIS — R451 Restlessness and agitation: Secondary | ICD-10-CM | POA: Diagnosis not present

## 2017-08-31 DIAGNOSIS — E039 Hypothyroidism, unspecified: Secondary | ICD-10-CM | POA: Diagnosis not present

## 2017-08-31 DIAGNOSIS — E119 Type 2 diabetes mellitus without complications: Secondary | ICD-10-CM | POA: Diagnosis not present

## 2017-09-18 DIAGNOSIS — M1712 Unilateral primary osteoarthritis, left knee: Secondary | ICD-10-CM | POA: Diagnosis not present

## 2017-09-18 DIAGNOSIS — M1711 Unilateral primary osteoarthritis, right knee: Secondary | ICD-10-CM | POA: Diagnosis not present

## 2017-09-24 IMAGING — CR DG CHEST 2V
2 series · 2 of 2 positions shown · non-contrast
Comparison: 02/02/2016 chest radiograph.

CLINICAL DATA: Near syncope.  Weakness.

EXAM:
CHEST  2 VIEW

[chest lat]
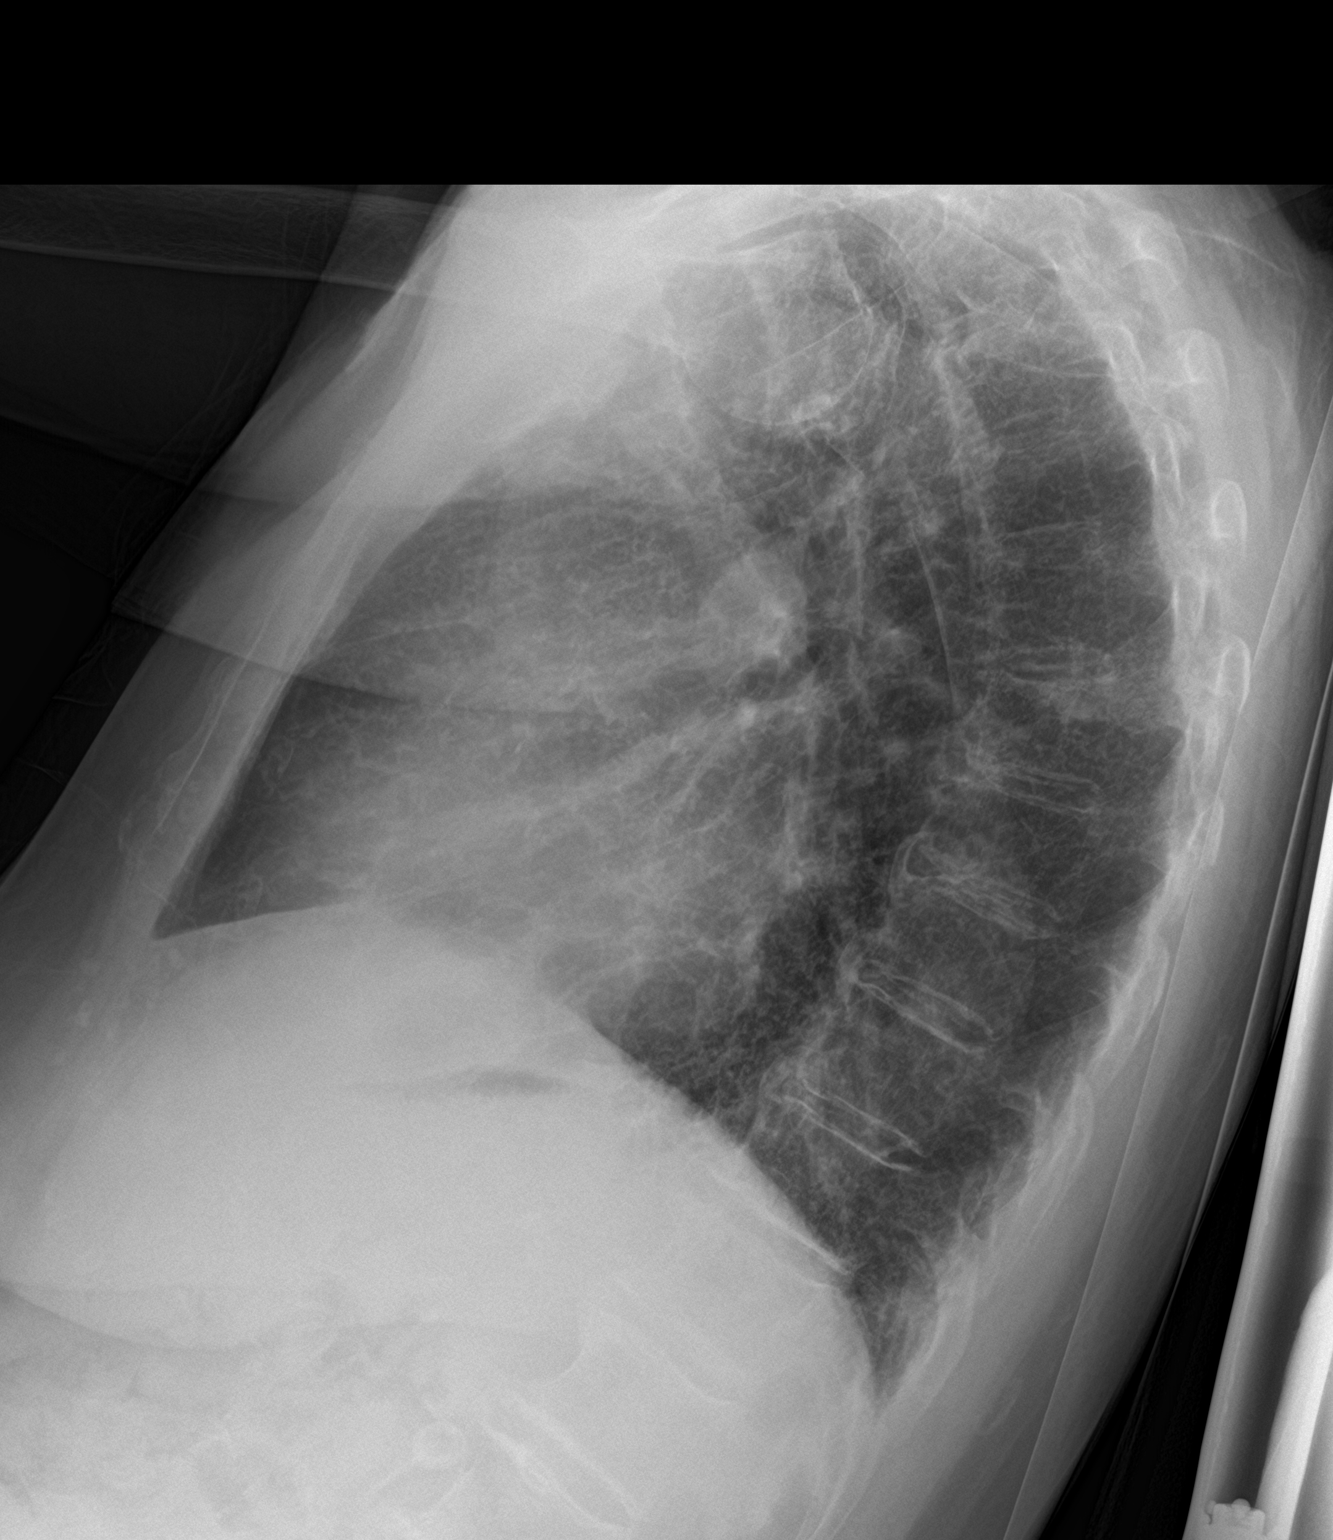

[chest ap]
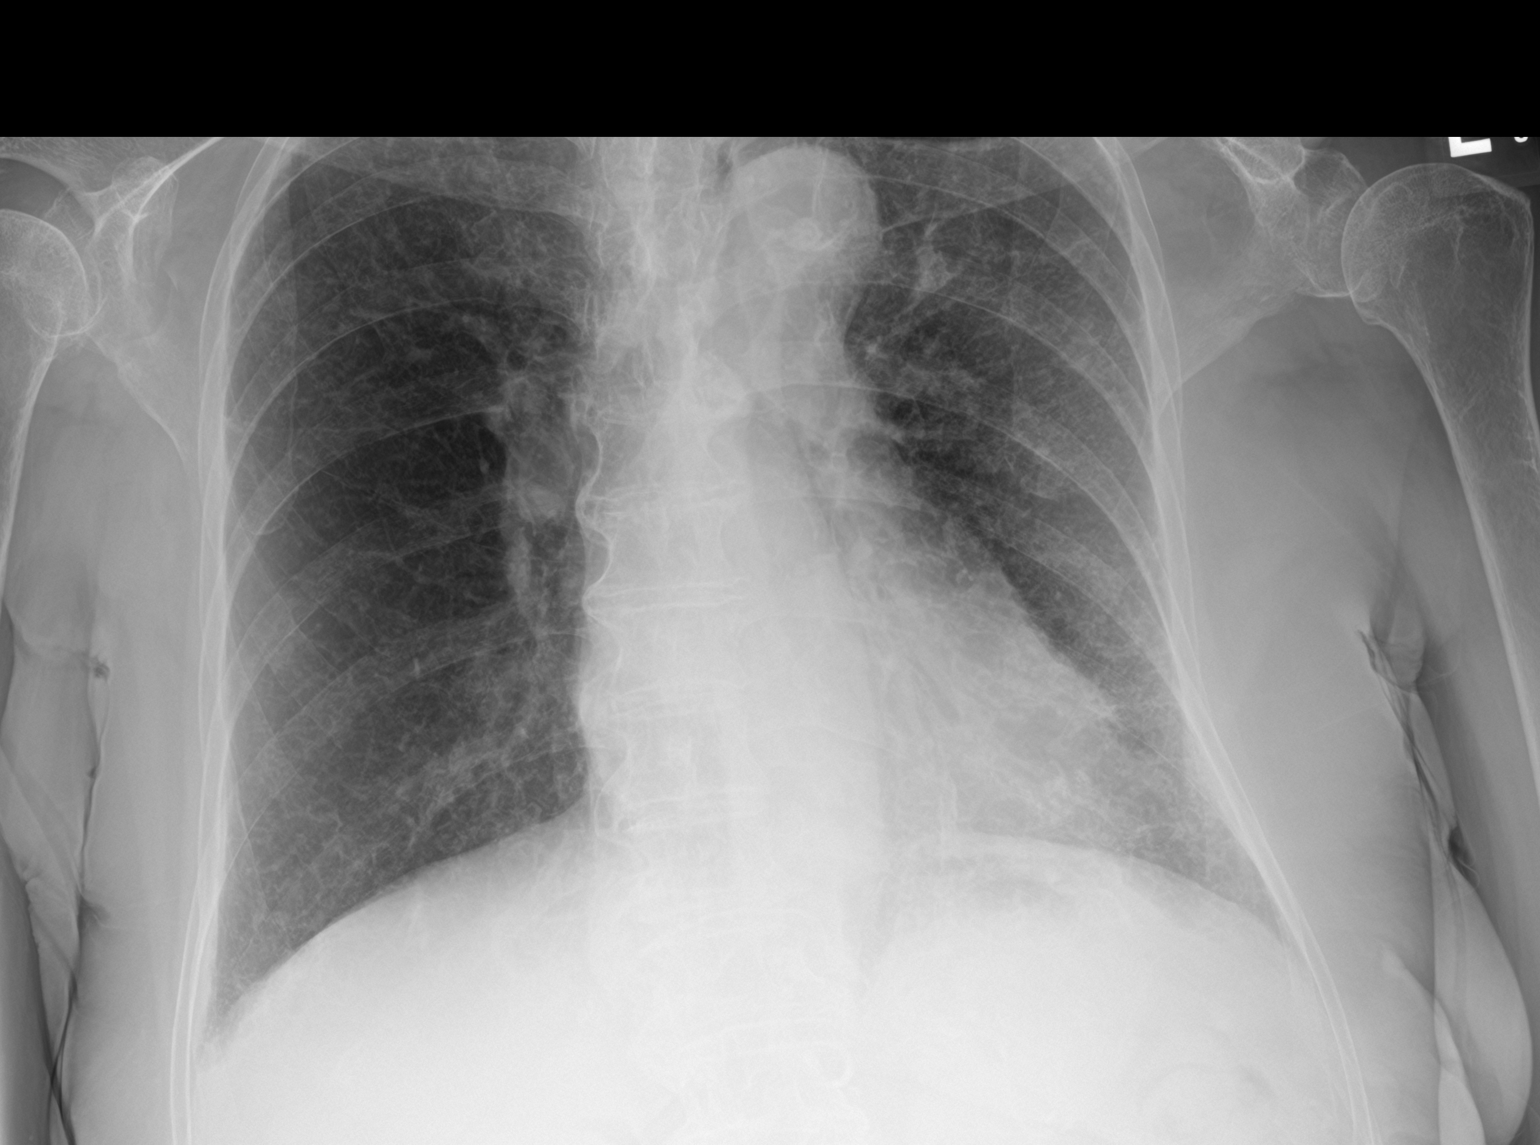

[2 of 2 positions shown; findings below may reference images not displayed]

FINDINGS: Stable cardiomediastinal silhouette with normal heart size. No
pneumothorax. No pleural effusion. No pulmonary edema. No acute
consolidative airspace disease. Mild peripheral basilar predominant
chronic reticular opacities throughout both lungs appear stable.
IMPRESSION: 1. No acute cardiopulmonary disease.
2. Mild peripheral basilar predominant chronic reticular lung
opacities appear stable and are nonspecific, with a nonprogressive
interstitial lung disease not excluded.

## 2017-09-30 NOTE — Progress Notes (Deleted)
MEDICARE WELLNESS AND FOLLOW UP  Assessment:   Essential hypertension - continue medications, DASH diet, exercise and monitor at home. Call if greater than 130/80.  - BASIC METABOLIC PANEL WITH GFR - Hepatic function panel  T2_NIDDM /CKD3 Discussed general issues about diabetes pathophysiology and management., Educational material distributed., Suggested low cholesterol diet., Encouraged aerobic exercise., Discussed foot care., Reminded to get yearly retinal exam. - Hemoglobin A1c   Hypothyroidism, unspecified hypothyroidism type Hypothyroidism-check TSH level, continue medications the same, reminded to take on an empty stomach 30-26mns before food.  - TSH   Mixed hyperlipidemia -continue medications, check lipids, decrease fatty foods, increase activity.  - Lipid panel  Normocytic anemia - CBC with Differential/Platelet   Seizure remission   Vitamin D deficiency - Vit D  25 hydroxy (rtn osteoporosis monitoring)  Encounter for long-term (current) use of medications - Magnesium  Hepatitis C virus infection without hepatic coma, unspecified chronicity remission  Knee pain, bilateral Better after injections  Medicare wellness Patient is DNR.  Discussed palliative care and daughter is agreeable and when the patient qualifies for hospice she will be agreeable.   SDAT discussed with age, it is normal/age related Patient is having hallucinations, will check labs for infection Continue trazodone, declines seroquel at this time   Over 30 minutes of exam, counseling, chart review, and critical decision making was performed Future Appointments  Date Time Provider DBay Shore 10/02/2017  2:45 PM CVicie Mutters PA-C [facility] None  06/23/2018  2:00 PM CVicie Mutters PA-C [facility] None   During the course of the visit the patient was educated and counseled about appropriate screening and preventive services including:    Pneumococcal vaccine   Influenza  vaccine  Td vaccine  Screening electrocardiogram  Screening mammography  Bone densitometry screening  Colorectal cancer screening  Diabetes screening  Glaucoma screening  Nutrition counseling   Advanced directives: given info/requested   Subjective:   Julie BLYDENis a 84y.o. female who presents for medicare wellness and 3 month follow up on hypertension, diabetes, hyperlipidemia, vitamin D def.   Daughter, JAnalyah Mcconnon is interested in hospice or palliative care due to declining health. She is DNR.  She has dementia and her daughter provides the majority of the history. She is having hallucinations, trouble with swallowing.   Patient lives with her daughter, denies any falls in the past year.  She complains of chronic pain in bilateral knees and follows with Dr. DFerd Hibbs and had bilateral knee shots that have helped not taking any tramadol, Walks with walker. No falls recently.     Her weight is back up, daughter is feeding her baby food. She is coughing after eating foods and belching after eating foods, saying food is not going down. Has seen speech therapy and has altered her eating.  Wt Readings from Last 5 Encounters:  07/03/17 124 lb (56.2 kg)  06/26/17 121 lb (54.9 kg)  06/14/17 121 lb (54.9 kg)  05/20/17 115 lb (52.2 kg)  05/15/17 125 lb (56.7 kg)    Her blood pressure has been controlled at home,  today their BP is  .  She does not workout. She denies chest pain, shortness of breath, dizziness.  She is not on cholesterol medication and denies myalgias. Her cholesterol is not at goal. The cholesterol last visit was:   Lab Results  Component Value Date   CHOL 226 (H) 06/14/2017   HDL 53 06/14/2017   LDLCALC 146 (H) 06/14/2017   TRIG 144 06/14/2017  CHOLHDL 4.3 06/14/2017   She has been working on diet and exercise for diabetes with CKD, she is off ARB due to hypotension, she is off the metformin and is diet controlled, sugars have been monitored  very closely by her daughter, and denies paresthesia of the feet, polydipsia, polyuria and visual disturbances. Last A1C in the office was:  Lab Results  Component Value Date   HGBA1C 8.1 (H) 06/14/2017   Patient is on Vitamin D supplement. Lab Results  Component Value Date   VD25OH 45 09/15/2014     She has a history of GERD.  She is on thyroid medication. Her medication was not changed last visit.   Lab Results  Component Value Date   TSH 0.64 06/14/2017  .   Medication Review  Current Outpatient Medications (Endocrine & Metabolic):  .  glipiZIDE (GLUCOTROL) 5 MG tablet, Take 0.5 tablets (2.5 mg total) by mouth 2 (two) times daily before a meal. .  levothyroxine (SYNTHROID, LEVOTHROID) 50 MCG tablet, TAKE 1 TABLET BY MOUTH  DAILY .  predniSONE (DELTASONE) 20 MG tablet, 2 tablets daily for 3 days, 1 tablet daily for 4 days. (Patient not taking: Reported on 07/03/2017)   Current Outpatient Medications (Respiratory):  .  cetirizine (ZYRTEC) 10 MG tablet, Take 10 mg by mouth daily. Marland Kitchen  loratadine (CLARITIN) 10 MG tablet, Take 10 mg by mouth daily.  Current Outpatient Medications (Analgesics):  .  aspirin EC 81 MG tablet, Take 81 mg by mouth daily.   Current Outpatient Medications (Other):  Marland Kitchen  Black Currant Seed Oil 500 MG CAPS, Take 1 capsule by mouth 2 (two) times daily. .  blood glucose meter kit and supplies, Check sugars 1 daily Dispense based insurance preference. E11.9 .  clotrimazole-betamethasone (LOTRISONE) cream, Apply to affected area 2 times daily .  doxycycline (VIBRA-TABS) 100 MG tablet, Take 1 tablet (100 mg total) by mouth every 12 (twelve) hours. (Patient not taking: Reported on 07/03/2017) .  Multiple Vitamin (MULTI-VITAMIN DAILY PO), Take by mouth. .  NON FORMULARY, Apply 1 application topically See admin instructions. Horse linament  As needed .  Polyethylene Glycol 3350 (MIRALAX PO), Take by mouth. .  silver sulfADIAZINE (SILVADENE) 1 % cream, APPLY 1  APPLICATION TOPICALLY DAILY. .  traZODone (DESYREL) 50 MG tablet, 1/2-1 tablet for sleep - start with lower dose and only increase if needed.  Current Problems (verified) Patient Active Problem List   Diagnosis Date Noted  . Mild malnutrition ([place]) 05/15/2017  . Alzheimer disease 12/11/2016  . Encounter for Medicare annual wellness exam 09/08/2014  . Knee pain, bilateral 06/14/2014  . Hypothyroidism 02/23/2014  . Medication management 08/20/2013  . Vitamin D deficiency 02/19/2013  . Seizure ([place]) 06/13/2012  . Hepatitis C 06/10/2012  . Normocytic anemia 06/10/2012  . T2_NIDDM Rosalee Kaufman 06/10/2012  . Mixed hyperlipidemia 06/10/2012  . Essential hypertension 06/10/2012    Screening Tests Immunization History  Administered Date(s) Administered  . Influenza Whole 10/29/2012  . Influenza, High Dose Seasonal PF 11/23/2013, 12/20/2014, 11/26/2016  . Pneumococcal Conjugate-13 04/30/2016  . Pneumococcal Polysaccharide-23 07/15/2008    Preventative care:  Last colonoscopy: declines Last mammogram: declines Last pap smear/pelvic exam: declines   DEXA:declines  Prior vaccinations: TD or Tdap: declines  Influenza: 2017 Pneumococcal: 2010 Prevnar 13: declines Shingles/Zostavax: declines.   Names of Other Physician/Practitioners you currently use: 1. [facility]- here for primary care Patient Care Team: Unk Pinto, MD as PCP - General (Internal Medicine) Warden Fillers, MD as Consulting  Physician (Optometry) Richmond Campbell, MD as Consulting Physician (Gastroenterology) Melrose Nakayama, MD as Consulting Physician (Orthopedic Surgery) Marty Heck, MD as Consulting Physician (Gastroenterology) Vicie Mutters, PA-C as Referring Physician (Physician Assistant)  Allergies No Known Allergies  SURGICAL HISTORY She  has a past surgical history that includes Cataract extraction (Right); Eye surgery; Corneal transplant (Right); and  Abdominal hysterectomy. FAMILY HISTORY Her family history includes Dementia in her brother, sister, sister, sister, and sister; Heart attack in her father and mother; Hypertension in her father and mother. SOCIAL HISTORY She  reports that she has never smoked. She has quit using smokeless tobacco.  Her smokeless tobacco use included snuff. She reports that she does not drink alcohol or use drugs.  MEDICARE WELLNESS OBJECTIVES: Physical activity:   Cardiac risk factors:   Depression/mood screen:   Depression screen [facility] 2/9 10/31/2016  Decreased Interest 0  Down, Depressed, Hopeless 0  PHQ - 2 Score 0    ADLs:  In your present state of health, do you have any difficulty performing the following activities: 05/16/2017 05/16/2017  Hearing? - Y  Vision? - Y  Difficulty concentrating or making decisions? - Y  Walking or climbing stairs? - Y  Dressing or bathing? - Y  Doing errands, shopping? [place] and eating ? - -  Using the Toilet? - -  In the past six months, have you accidently leaked urine? - -  Do you have problems with loss of bowel control? - -  Managing your Medications? - -  Managing your Finances? - -  Housekeeping or managing your Housekeeping? - -  Some recent data might be hidden     Cognitive Testing  Alert? Yes  Normal Appearance?Yes  Oriented to person? Yes  Place? Yes   Time? Yes  Recall of three objects?  Yes  Can perform simple calculations? Yes  Displays appropriate judgment?Yes  Can read the correct time from a watch face?Yes  EOL planning:       Objective:   There were no vitals taken for this visit. There is no height or weight on file to calculate BMI.  General appearance: alert, no distress, WD/WN,  female HEENT: normocephalic, sclerae anicteric, TMs pearly, nares patent, no discharge or erythema, pharynx normal Oral cavity: MMM, no lesions, dentures Neck: supple, no lymphadenopathy, no thyromegaly, no masses Heart:  RRR, normal S1, S2 Lungs: rhonchi left lung, no wheezes, rhonchi, or rales Abdomen: +bs, soft, non tender, non distended, no masses, no hepatomegaly, no splenomegaly Musculoskeletal: nontender, no swelling, no obvious deformity Extremities: no edema, no cyanosis, no clubbing Pulses: 2+ symmetric, upper and lower extremities, normal cap refill Neurological: alert, oriented x 1, not able to appropriately follow directions, CN2-12 intact, strength decreased upper extremities and lower extremities, sensation decreased throughout bilateral feet, unsteady gait, walks with walker Psychiatric: normal affect, behavior normal, pleasant  Breast: defer Gyn: defer Rectal: defer   Medicare Attestation I have personally reviewed: The patient's medical and social history Their use of alcohol, tobacco or illicit drugs Their current medications and supplements The patient's functional ability including ADLs,fall risks, home safety risks, cognitive, and hearing and visual impairment Diet and physical activities Evidence for depression or mood disorders  The patient's weight, height, BMI, and visual acuity have been recorded in the chart.  I have made referrals, counseling, and provided education to the patient based on review of the above and I have provided the patient with a written personalized care plan for  preventive services.    Vicie Mutters, PA-C   09/30/2017

## 2017-10-01 ENCOUNTER — Other Ambulatory Visit: Payer: Self-pay | Admitting: Physician Assistant

## 2017-10-02 ENCOUNTER — Ambulatory Visit: Payer: Self-pay | Admitting: Physician Assistant

## 2017-10-02 ENCOUNTER — Ambulatory Visit: Payer: Self-pay | Admitting: Internal Medicine

## 2017-10-08 DIAGNOSIS — L89159 Pressure ulcer of sacral region, unspecified stage: Secondary | ICD-10-CM | POA: Diagnosis not present

## 2017-10-08 DIAGNOSIS — R451 Restlessness and agitation: Secondary | ICD-10-CM | POA: Diagnosis not present

## 2017-10-08 DIAGNOSIS — G8929 Other chronic pain: Secondary | ICD-10-CM | POA: Diagnosis not present

## 2017-10-08 DIAGNOSIS — E119 Type 2 diabetes mellitus without complications: Secondary | ICD-10-CM | POA: Diagnosis not present

## 2017-10-18 ENCOUNTER — Encounter: Payer: Self-pay | Admitting: Physician Assistant

## 2017-10-18 ENCOUNTER — Ambulatory Visit (INDEPENDENT_AMBULATORY_CARE_PROVIDER_SITE_OTHER): Payer: Medicare Other | Admitting: Physician Assistant

## 2017-10-18 VITALS — BP 130/74 | HR 81 | Temp 97.8°F | Resp 16 | Ht 63.0 in | Wt 124.6 lb

## 2017-10-18 DIAGNOSIS — E782 Mixed hyperlipidemia: Secondary | ICD-10-CM

## 2017-10-18 DIAGNOSIS — B1920 Unspecified viral hepatitis C without hepatic coma: Secondary | ICD-10-CM | POA: Diagnosis not present

## 2017-10-18 DIAGNOSIS — D649 Anemia, unspecified: Secondary | ICD-10-CM

## 2017-10-18 DIAGNOSIS — R569 Unspecified convulsions: Secondary | ICD-10-CM

## 2017-10-18 DIAGNOSIS — K5904 Chronic idiopathic constipation: Secondary | ICD-10-CM

## 2017-10-18 DIAGNOSIS — Z0000 Encounter for general adult medical examination without abnormal findings: Secondary | ICD-10-CM

## 2017-10-18 DIAGNOSIS — R6889 Other general symptoms and signs: Secondary | ICD-10-CM | POA: Diagnosis not present

## 2017-10-18 DIAGNOSIS — F02818 Dementia in other diseases classified elsewhere, unspecified severity, with other behavioral disturbance: Secondary | ICD-10-CM

## 2017-10-18 DIAGNOSIS — E1121 Type 2 diabetes mellitus with diabetic nephropathy: Secondary | ICD-10-CM

## 2017-10-18 DIAGNOSIS — Z0001 Encounter for general adult medical examination with abnormal findings: Secondary | ICD-10-CM | POA: Diagnosis not present

## 2017-10-18 DIAGNOSIS — G301 Alzheimer's disease with late onset: Secondary | ICD-10-CM

## 2017-10-18 DIAGNOSIS — E039 Hypothyroidism, unspecified: Secondary | ICD-10-CM | POA: Diagnosis not present

## 2017-10-18 DIAGNOSIS — I10 Essential (primary) hypertension: Secondary | ICD-10-CM | POA: Diagnosis not present

## 2017-10-18 DIAGNOSIS — E441 Mild protein-calorie malnutrition: Secondary | ICD-10-CM

## 2017-10-18 DIAGNOSIS — E559 Vitamin D deficiency, unspecified: Secondary | ICD-10-CM

## 2017-10-18 DIAGNOSIS — R350 Frequency of micturition: Secondary | ICD-10-CM

## 2017-10-18 DIAGNOSIS — R443 Hallucinations, unspecified: Secondary | ICD-10-CM

## 2017-10-18 DIAGNOSIS — F0281 Dementia in other diseases classified elsewhere with behavioral disturbance: Secondary | ICD-10-CM

## 2017-10-18 MED ORDER — LACTULOSE 10 GM/15ML PO SOLN: 20.0000 g | Freq: Two times a day (BID) | ORAL | 0 refills | Status: DC | PRN

## 2017-10-18 NOTE — Patient Instructions (Addendum)
FOR ANXIETY  Can try the propanolol that palliative care sent in Give it to her around the same time before the sun downing or agitation at night Can give with the trazadone 50mg   IF THIS DOES NOT WORK SUGGEST TRYING SEROQUEL  Seroquel 25 mg strength: Take 1/4 pill each night for 1 week, then 1/2 pill each night for 2 weeks, then 1 pill each night thereafter.   Common side effects include somnolence (excessive sleepiness), dry mouth, constipation, dizziness, increased appetite, dyspepsia, weight gain, fatigue.   Rare side effects include dysarthria (difficulty talking, i.e. slurring of speech), nasal congestion, confusion, personality changes, balance problems.   Elderly on antipsychotics may have a 1.4 to 1.6 times higher chanCe of death BUT SEROQUEL HAS THE LEAST CHANCE OF THIS. .   FOR CONSTIPATION CAN CONTINUE THE MIRALAX Can try the lactulose for constipation Start out with 10mg  or 15 ml once a day, can go up to 30 ml twice a day as needed for constipation Please also make sure you are drinking a lot of water, up to 64-80 oz a day Try to increase your fiber such as whole graines and veggies You can also add a fiber supplement like Citracel or Benefiber, these do not cause gas and bloating and are safe to use.   Walking or moving 20 mins a day can helps as well  If you have severe AB pain, no BM for 3-4 days, or blood in your stool let us know.

## 2017-10-18 NOTE — Progress Notes (Signed)
MEDICARE WELLNESS AND FOLLOW UP  Assessment:   Constipation AB soft, non distened, continue miralax daily, add on lactulose  Hallucinations Discussed sun downing and aging process Suggest seroquel, declines at this time Discuss with pallative care doctors Okay with switching care due to taxing effort to leave the home   Essential hypertension - continue medications, DASH diet, exercise and monitor at home. Call if greater than 130/80.   T2_NIDDM /CKD3 Discussed general issues about diabetes pathophysiology and management., Educational material distributed., Suggested low cholesterol diet., Encouraged aerobic exercise., Discussed foot care., Reminded to get yearly retinal exam.   Hypothyroidism, unspecified hypothyroidism type Hypothyroidism-check TSH level, continue medications the same, reminded to take on an empty stomach 30-3mns before food.    Mixed hyperlipidemia -continue medications, check lipids, decrease fatty foods, increase activity.   Normocytic anemia   Seizure remission   Vitamin D deficiency  Encounter for long-term (current) use of medications  Hepatitis C virus infection without hepatic coma, unspecified chronicity remission  Medicare wellness Patient is DNR.   SDAT discussed with age, it is normal/age related Patient is having hallucinations, will check urine Continue trazodone, declines seroquel at this time but will think about it  Mild malnutrition Add ensure  Over 30 minutes of exam, counseling, chart review, and critical decision making was performed Future Appointments  Date Time Provider DBandana 10/30/2017  9:30 AM MGardiner Barefoot DPM [facility]  06/23/2018  2:00 PM CVicie Mutters PA-C [facility] None   During the course of the visit the patient was educated and counseled about appropriate screening and preventive services including:    Pneumococcal vaccine   Influenza vaccine  Td vaccine  Screening  electrocardiogram  Screening mammography  Bone densitometry screening  Colorectal cancer screening  Diabetes screening  Glaucoma screening  Nutrition counseling   Advanced directives: given info/requested   Subjective:   Julie CARBONELLis a 84y.o. female who presents for medicare wellness and 3 month follow up on hypertension, diabetes, hyperlipidemia, vitamin D def.   Daughter, [name] patient is currently involved with pallitive care, She is DNR. She wants to start being just seen by the palliative doctor so she does not have to leave the house which is taxing and we are appreciative. .   She has been constipated, using miralax every day but daughter states she is still constipated.   She has dementia and her daughter provides the majority of the history. She is having hallucinations, and has not slept for 3 days, she is up screaming at night. Her pallative care doctor has prescribed something at this time.    Patient lives with her daughter, denies any falls in the past year.    Her weight is back up, daughter is feeding her baby food. She is coughing after eating foods and belching after eating foods, saying food is not going down. Has seen speech therapy and has altered her eating.  Wt Readings from Last 5 Encounters:  10/18/17 124 lb 9.6 oz (56.5 kg)  07/03/17 124 lb (56.2 kg)  06/26/17 121 lb (54.9 kg)  06/14/17 121 lb (54.9 kg)  05/20/17 115 lb (52.2 kg)    Her blood pressure has been controlled at home,  today their BP is BP: 130/74.  She does not workout. She denies chest pain, shortness of breath, dizziness.  She is not on cholesterol medication and denies myalgias. Her cholesterol is not at goal. The cholesterol last visit was:   Lab Results  Component Value  Date   CHOL 226 (H) 06/14/2017   HDL 53 06/14/2017   LDLCALC 146 (H) 06/14/2017   TRIG 144 06/14/2017   CHOLHDL 4.3 06/14/2017   She has been working on diet and exercise for diabetes with  CKD, she is off ARB due to hypotension, she is off the metformin and is diet controlled, sugars have been monitored very closely by her daughter, and denies paresthesia of the feet, polydipsia, polyuria and visual disturbances. Last A1C in the office was:  Lab Results  Component Value Date   HGBA1C 8.1 (H) 06/14/2017   Patient is on Vitamin D supplement. Lab Results  Component Value Date   VD25OH 45 09/15/2014     She has a history of GERD.  She is on thyroid medication. Her medication was not changed last visit.   Lab Results  Component Value Date   TSH 0.64 06/14/2017  .   Medication Review  Current Outpatient Medications (Endocrine & Metabolic):  .  glipiZIDE (GLUCOTROL) 5 MG tablet, Take 0.5 tablets (2.5 mg total) by mouth 2 (two) times daily before a meal. .  levothyroxine (SYNTHROID, LEVOTHROID) 50 MCG tablet, TAKE 1 TABLET BY MOUTH  DAILY .  predniSONE (DELTASONE) 20 MG tablet, 2 tablets daily for 3 days, 1 tablet daily for 4 days.   Current Outpatient Medications (Respiratory):  .  cetirizine (ZYRTEC) 10 MG tablet, Take 10 mg by mouth daily. Marland Kitchen  loratadine (CLARITIN) 10 MG tablet, Take 10 mg by mouth daily.  Current Outpatient Medications (Analgesics):  .  aspirin EC 81 MG tablet, Take 81 mg by mouth daily. .  traMADol (ULTRAM) 50 MG tablet, TAKE 1 TABLET BY MOUTH  TWICE A DAY ONLY IF NEEDED  FOR SEVERE PAIN   Current Outpatient Medications (Other):  Marland Kitchen  Black Currant Seed Oil 500 MG CAPS, Take 1 capsule by mouth 2 (two) times daily. .  blood glucose meter kit and supplies, Check sugars 1 daily Dispense based insurance preference. E11.9 .  clotrimazole-betamethasone (LOTRISONE) cream, Apply to affected area 2 times daily .  Multiple Vitamin (MULTI-VITAMIN DAILY PO), Take by mouth. .  NON FORMULARY, Apply 1 application topically See admin instructions. Horse linament  As needed .  Polyethylene Glycol 3350 (MIRALAX PO), Take by mouth. .  silver sulfADIAZINE (SILVADENE) 1  % cream, APPLY 1 APPLICATION TOPICALLY DAILY. .  traZODone (DESYREL) 50 MG tablet, 1/2-1 tablet for sleep - start with lower dose and only increase if needed.  Current Problems (verified) Patient Active Problem List   Diagnosis Date Noted  . Mild malnutrition ([place]) 05/15/2017  . Alzheimer disease 12/11/2016  . Encounter for Medicare annual wellness exam 09/08/2014  . Knee pain, bilateral 06/14/2014  . Hypothyroidism 02/23/2014  . Medication management 08/20/2013  . Vitamin D deficiency 02/19/2013  . Seizure ([place]) 06/13/2012  . Hepatitis C 06/10/2012  . Normocytic anemia 06/10/2012  . T2_NIDDM Rosalee Kaufman 06/10/2012  . Mixed hyperlipidemia 06/10/2012  . Essential hypertension 06/10/2012    Screening Tests Immunization History  Administered Date(s) Administered  . Influenza Whole 10/29/2012  . Influenza, High Dose Seasonal PF 11/23/2013, 12/20/2014, 11/26/2016  . Pneumococcal Conjugate-13 04/30/2016  . Pneumococcal Polysaccharide-23 07/15/2008    Preventative care:  Last colonoscopy: declines Last mammogram: declines Last pap smear/pelvic exam: declines   DEXA:declines  Prior vaccinations: TD or Tdap: declines  Influenza: 2017 Pneumococcal: 2010 Prevnar 13: declines Shingles/Zostavax: declines.   Names of Other Physician/Practitioners you currently use: 1. [facility]- here for primary care  Patient Care Team: Unk Pinto, MD as PCP - General (Internal Medicine) Warden Fillers, MD as Consulting Physician (Optometry) Richmond Campbell, MD as Consulting Physician (Gastroenterology) Melrose Nakayama, MD as Consulting Physician (Orthopedic Surgery) Marty Heck, MD as Consulting Physician (Gastroenterology) Vicie Mutters, PA-C as Referring Physician (Physician Assistant)  Allergies No Known Allergies  SURGICAL HISTORY She  has a past surgical history that includes Cataract extraction (Right); Eye surgery; Corneal transplant  (Right); and Abdominal hysterectomy. FAMILY HISTORY Her family history includes Dementia in her brother, sister, sister, sister, and sister; Heart attack in her father and mother; Hypertension in her father and mother. SOCIAL HISTORY She  reports that she has never smoked. She has quit using smokeless tobacco.  Her smokeless tobacco use included snuff. She reports that she does not drink alcohol or use drugs.  MEDICARE WELLNESS OBJECTIVES: Physical activity: Current Exercise Habits: The patient does not participate in regular exercise at present, Exercise limited by: neurologic condition(s);psychological condition(s);orthopedic condition(s);respiratory conditions(s) Cardiac risk factors: Cardiac Risk Factors include: advanced age (>66mn, >>41women);diabetes mellitus;dyslipidemia;hypertension;sedentary lifestyle;family history of premature cardiovascular disease Depression/mood screen:   Depression screen [place] [place]/9 10/18/2017  Decreased Interest 0  Down, Depressed, Hopeless 0  PHQ - 2 Score 0    ADLs:  In your present state of health, do you have any difficulty performing the following activities: 10/18/2017 05/16/2017  Hearing? [place] Y -  Difficulty concentrating or making decisions? Y -  Walking or climbing stairs? Y -  Dressing or bathing? Y -  Doing errands, shopping? YTroutmanand eating ? Y -  Using the Toilet? Y -  In the past six months, have you accidently leaked urine? Y -  Do you have problems with loss of bowel control? N -  Managing your Medications? Y -  Managing your Finances? Y -  Housekeeping or managing your Housekeeping? Y -  Some recent data might be hidden     Cognitive Testing  Patient unable to follow directions or answer questions.   EOL planning: Does Patient Have a Medical Advance Directive?: No Would patient like information on creating a medical advance directive?: Yes (MAU/Ambulatory/Procedural Areas - Information  given)     Objective:   Blood pressure 130/74, pulse 81, temperature 97.8 F (36.6 C), resp. rate 16, height '5\' 3"'  (1.6 m), weight 124 lb 9.6 oz (56.5 kg), SpO2 97 %. Body mass index is 22.07 kg/m.  General appearance: sleeping, no distress, WD/WN,  female HEENT: normocephalic, sclerae anicteric, TMs pearly, nares patent, no discharge or erythema, pharynx normal Oral cavity: MMM, no lesions, dentures Neck: supple, no lymphadenopathy, no thyromegaly, no masses Heart: RRR, normal S1, S2 Lungs: CTAB, no wheezes, rhonchi, or rales Abdomen: +bs, soft, non tender, non distended, no masses, no hepatomegaly, no splenomegaly Musculoskeletal: nontender, no swelling, no obvious deformity Extremities: no edema, no cyanosis, no clubbing Pulses: 2+ symmetric, upper and lower extremities, normal cap refill Neurological: alert, oriented x 0, not able to appropriately follow directions, CN2-12 intact, strength decreased upper extremities and lower extremities, sensation decreased throughout bilateral feet, unsteady gait, walks with walker Psychiatric: flat affect, behavior normal for her age, pleasant  Breast: defer Gyn: defer Rectal: defer   Medicare Attestation I have personally reviewed: The patient's medical and social history Their use of alcohol, tobacco or illicit drugs Their current medications and supplements The patient's functional ability including ADLs,fall risks, home safety risks, cognitive, and hearing and visual impairment Diet and physical  activities Evidence for depression or mood disorders  The patient's weight, height, BMI, and visual acuity have been recorded in the chart.  I have made referrals, counseling, and provided education to the patient based on review of the above and I have provided the patient with a written personalized care plan for preventive services.    Vicie Mutters, PA-C   10/18/2017

## 2017-10-23 ENCOUNTER — Other Ambulatory Visit: Payer: Medicare Other

## 2017-10-23 DIAGNOSIS — R350 Frequency of micturition: Secondary | ICD-10-CM

## 2017-10-24 LAB — URINALYSIS, ROUTINE W REFLEX MICROSCOPIC
BILIRUBIN URINE: NEGATIVE
GLUCOSE, UA: NEGATIVE
Hgb urine dipstick: NEGATIVE
Ketones, ur: NEGATIVE
LEUKOCYTES UA: NEGATIVE
Nitrite: NEGATIVE
Protein, ur: NEGATIVE
Specific Gravity, Urine: 1.012 (ref 1.001–1.03)

## 2017-10-24 LAB — URINE CULTURE
MICRO NUMBER: 91090370
Result:: NO GROWTH
SPECIMEN QUALITY: ADEQUATE

## 2017-10-30 ENCOUNTER — Encounter: Payer: Self-pay | Admitting: Podiatry

## 2017-10-30 ENCOUNTER — Ambulatory Visit: Payer: Medicare Other | Admitting: Podiatry

## 2017-10-30 VITALS — BP 106/78 | HR 62

## 2017-10-30 DIAGNOSIS — M79675 Pain in left toe(s): Secondary | ICD-10-CM

## 2017-10-30 DIAGNOSIS — E1159 Type 2 diabetes mellitus with other circulatory complications: Secondary | ICD-10-CM

## 2017-10-30 DIAGNOSIS — M79674 Pain in right toe(s): Secondary | ICD-10-CM | POA: Diagnosis not present

## 2017-10-30 DIAGNOSIS — B351 Tinea unguium: Secondary | ICD-10-CM

## 2017-10-30 NOTE — Progress Notes (Signed)
Complaint:  Visit Type: Patient presents  to my office for continued preventative foot care services. She presents to the office with her daughter.  Her daughter says her medical doctor recommended she present to this office for treatment. .Complaint: Patient states" my nails have grown long and thick and become painful to walk and wear shoes" Patient has been diagnosed with DM . The patient presents for preventative foot care services. No changes to ROS  Podiatric Exam: Vascular: dorsalis pedis and posterior tibial pulses are not  palpable bilateral. Capillary return is immediate. Temperature gradient is WNL. Skin turgor WNL  Sensorium: Deferred. Nail Exam: Pt has thick disfigured discolored nails with subungual debris noted bilateral entire nail hallux through fifth toenails Ulcer Exam: There is no evidence of ulcer or pre-ulcerative changes or infection. Orthopedic Exam: Muscle tone and strength are WNL. No limitations in general ROM. No crepitus or effusions noted. Foot type and digits show no abnormalities. Bony prominences are unremarkable. Skin: No Porokeratosis. No infection or ulcers  Diagnosis:  Onychomycosis, , Pain in right toe, pain in left toes  Treatment & Plan Procedures and Treatment: Consent by patient was obtained for treatment procedures.   Debridement of mycotic and hypertrophic toenails, 1 through 5 bilateral and clearing of subungual debris. No ulceration, no infection noted. . Patient has Alzheimer's and she covered her face with her hat during the total service. Return Visit-Office Procedure: Patient instructed to return to the office for a follow up visit 3 months for continued evaluation and treatment.    Helane GuntherGregory Annslee Tercero DPM

## 2017-11-21 ENCOUNTER — Encounter: Payer: Self-pay | Admitting: Physician Assistant

## 2017-11-21 ENCOUNTER — Ambulatory Visit (INDEPENDENT_AMBULATORY_CARE_PROVIDER_SITE_OTHER): Payer: Medicare Other | Admitting: Physician Assistant

## 2017-11-21 VITALS — BP 124/86 | HR 68 | Temp 98.2°F | Resp 14 | Ht 63.0 in | Wt 128.6 lb

## 2017-11-21 DIAGNOSIS — R109 Unspecified abdominal pain: Secondary | ICD-10-CM | POA: Diagnosis not present

## 2017-11-21 DIAGNOSIS — K5903 Drug induced constipation: Secondary | ICD-10-CM | POA: Diagnosis not present

## 2017-11-21 MED ORDER — LACTULOSE 10 GM/15ML PO SOLN
20.0000 g | Freq: Two times a day (BID) | ORAL | 0 refills | Status: AC | PRN
Start: 2017-11-21 — End: ?

## 2017-11-21 MED ORDER — LIDOCAINE 5 % EX PTCH: 1.0000 | MEDICATED_PATCH | CUTANEOUS | 0 refills | Status: AC

## 2017-11-21 NOTE — Progress Notes (Signed)
Assessment and Plan: [name] was seen today for acute visit.  Diagnoses and all orders for this visit:  Flank pain -     lidocaine (LIDODERM) 5 %; Place 1 patch onto the skin daily. Remove & Discard patch within 12 hours or as directed by MD LIKELY MUSCULAR, TRY LIDOCAINE PATCHES RX OR OTC -WATCH OUT FOR RASH - IF WORSE FOLLOW UP ORTHO  Drug-induced constipation -     lactulose (CHRONULAC) 10 GM/15ML solution; Take 30 mLs (20 g total) by mouth 2 (two) times daily as needed for mild constipation or moderate constipation. - GET ON MAGNESIUM - BENIGN AB - IF WORSE GO TO ER     HPI 84 y.o. [name] that is in palliative care, she has dementia and the history comes from her daughter. She states that she has been hollering out in pain on her left side/hip. Worse with walking and turning. She also has had constipated, last BM 2 days and very hard stools, disimpacted by her daughter. She has had miralax x 2 days. She can have a shot on the 11th with Dr. Latanya Maudlin. She has had tramadol 2 x a day which is likely contributing to the constipation.   Past Medical History:  Diagnosis Date  . Allergy    "all year round" (05/16/2017)  . Alzheimer disease ([place]) 12/11/2016   "not real bad; developing slow" (05/16/2017)  . Arthritis    "knees" (05/16/2017)  . Depression   . DJD (degenerative joint disease)   . GERD (gastroesophageal reflux disease)   . Glaucoma   . Hepatitis C   . Hyperlipidemia   . Hypertension   . Hypothyroidism   . Pneumonia ~ 2016; 05/2017  . Seizures ([place]) ~ 2013X 1   "she was real sick and admitted to hospital; had sz at home"  . Type II diabetes mellitus ([place])   . Vitamin D deficiency      No Known Allergies  Current Outpatient Medications on File Prior to Visit  Medication Sig  . ACCU-CHEK SOFTCLIX LANCETS lancets Accu-Chek Softclix Lancets  . ALPRAZolam (XANAX) 0.5 MG tablet Xanax 0.5 mg tablet  Take 1 tablet 3 times a day by oral route as needed.  Marland Kitchen aspirin EC 81  MG tablet Take 81 mg by mouth daily.  . Black Currant Seed Oil 500 MG CAPS Take 1 capsule by mouth 2 (two) times daily.  . blood glucose meter kit and supplies Check sugars 1 daily Dispense based insurance preference. E11.9  . Blood Glucose Monitoring Suppl (ACCU-CHEK AVIVA PLUS) w/Device KIT accu-chek    kit aviva pl  . cetirizine (ZYRTEC) 10 MG tablet Take 10 mg by mouth daily.  Marland Kitchen glipiZIDE (GLUCOTROL) 5 MG tablet Take 0.5 tablets (2.5 mg total) by mouth 2 (two) times daily before a meal.  . glucose blood test strip Accu-Chek Aviva Plus test strips  . levothyroxine (SYNTHROID, LEVOTHROID) 50 MCG tablet TAKE 1 TABLET BY MOUTH  DAILY  . loratadine (CLARITIN) 10 MG tablet Take 10 mg by mouth daily.  . Multiple Vitamin (MULTI-VITAMIN DAILY PO) Take by mouth.  . NON FORMULARY Apply 1 application topically See admin instructions. Horse linament  As needed  . Polyethylene Glycol 3350 (MIRALAX PO) Take by mouth.  . silver sulfADIAZINE (SILVADENE) 1 % cream APPLY 1 APPLICATION TOPICALLY DAILY.  . traMADol (ULTRAM) 50 MG tablet TAKE 1 TABLET BY MOUTH  TWICE A DAY ONLY IF NEEDED  FOR SEVERE PAIN  . traZODone (DESYREL) 50 MG tablet 1/2-1 tablet  for sleep - start with lower dose and only increase if needed.   No current facility-administered medications on file prior to visit.     ROS: all negative except above.   Physical Exam: Filed Weights   11/21/17 1426  Weight: 128 lb 9.6 oz (58.3 kg)   BP 124/86   Pulse 68   Temp 98.2 F (36.8 C)   Resp 14   Ht _0  (1.6 m)   Wt 128 lb 9.6 oz (58.3 kg)   SpO2 95%   BMI 22.78 kg/m  General appearance: alert, no distress, WD/WN,  female HEENT: normocephalic, sclerae anicteric, TMs pearly, nares patent, no discharge or erythema, pharynx normal Oral cavity: MMM, no lesions, dentures Neck: supple, no lymphadenopathy, no thyromegaly, no masses Heart: RRR, normal S1, S2 Lungs: rhonchi left lung, no wheezes, rhonchi, or rales Abdomen: +bs, soft, non  tender, non distended, no masses, no hepatomegaly, no splenomegaly Musculoskeletal: + tenderness left posterior ribs, no swelling, no obvious deformity Extremities: no edema, no cyanosis, no clubbing Pulses: 2+ symmetric, upper and lower extremities, normal cap refill Neurological: alert, oriented x 1, not able to appropriately follow directions, CN2-12 intact, strength decreased upper extremities and lower extremities, sensation decreased throughout bilateral feet, unsteady gait, walks with walker Psychiatric: normal affect, behavior normal, pleasant   Vicie Mutters, PA-C 3:03 PM [facility]

## 2017-11-21 NOTE — Patient Instructions (Addendum)
Add on magnesium 500mg  twice a day Can do lactulose 30mg  twice a day WITH the miralax  Likely since it is with movement she has a pulled muscle  Try to get patches from the pharmacy but if it is too expensive get the 4 % over the counter patches called salon pas and put it on the rib or area that hurts.   If she has any confusion, Shortness of breath, chest pain, severe pain go to the ER   Costochondritis Costochondritis is swelling and irritation (inflammation) of the tissue (cartilage) that connects your ribs to your breastbone (sternum). This causes pain in the front of your chest. The pain usually starts gradually and involves more than one rib. What are the causes? The exact cause of this condition is not always known. It results from stress on the cartilage where your ribs attach to your sternum. The cause of this stress could be:  Chest injury (trauma).  Exercise or activity, such as lifting.  Severe coughing.  What increases the risk? You may be at higher risk for this condition if you:  Are female.  Are 28?82 years old.  Recently started a new exercise or work activity.  Have low levels of vitamin D.  Have a condition that makes you cough frequently.  What are the signs or symptoms? The main symptom of this condition is chest pain. The pain:  Usually starts gradually and can be sharp or dull.  Gets worse with deep breathing, coughing, or exercise.  Gets better with rest.  May be worse when you press on the sternum-rib connection (tenderness).  How is this diagnosed? This condition is diagnosed based on your symptoms, medical history, and a physical exam. Your health care provider will check for tenderness when pressing on your sternum. This is the most important finding. You may also have tests to rule out other causes of chest pain. These may include:  A chest X-ray to check for lung problems.  An electrocardiogram (ECG) to see if you have a heart problem  that could be causing the pain.  An imaging scan to rule out a chest or rib fracture.  How is this treated? This condition usually goes away on its own over time. Your health care provider may prescribe an NSAID to reduce pain and inflammation. Your health care provider may also suggest that you:  Rest and avoid activities that make pain worse.  Apply heat or cold to the area to reduce pain and inflammation.  Do exercises to stretch your chest muscles.  If these treatments do not help, your health care provider may inject a numbing medicine at the sternum-rib connection to help relieve the pain. Follow these instructions at home:  Avoid activities that make pain worse. This includes any activities that use chest, abdominal, and side muscles.  If directed, put ice on the painful area: ? Put ice in a plastic bag. ? Place a towel between your skin and the bag. ? Leave the ice on for 20 minutes, 2-3 times a day.  If directed, apply heat to the affected area as often as told by your health care provider. Use the heat source that your health care provider recommends, such as a moist heat pack or a heating pad. ? Place a towel between your skin and the heat source. ? Leave the heat on for 20-30 minutes. ? Remove the heat if your skin turns bright red. This is especially important if you are unable to feel pain,  heat, or cold. You may have a greater risk of getting burned.  Take over-the-counter and prescription medicines only as told by your health care provider.  Return to your normal activities as told by your health care provider. Ask your health care provider what activities are safe for you.  Keep all follow-up visits as told by your health care provider. This is important. Contact a health care provider if:  You have chills or a fever.  Your pain does not go away or it gets worse.  You have a cough that does not go away (is persistent). Get help right away if:  You have  shortness of breath. This information is not intended to replace advice given to you by your health care provider. Make sure you discuss any questions you have with your health care provider. Document Released: 11/08/2004 Document Revised: 08/19/2015 Document Reviewed: 05/25/2015 Elsevier Interactive Patient Education  2018 [employer].    Constipation, Adult Constipation is when a person has fewer bowel movements in a week than normal, has difficulty having a bowel movement, or has stools that are dry, hard, or larger than normal. Constipation may be caused by an underlying condition. It may become worse with age if a person takes certain medicines and does not take in enough fluids. Follow these instructions at home: Eating and drinking   Eat foods that have a lot of fiber, such as fresh fruits and vegetables, whole grains, and beans.  Limit foods that are high in fat, low in fiber, or overly processed, such as french fries, hamburgers, cookies, candies, and soda.  Drink enough fluid to keep your urine clear or pale yellow. General instructions  Exercise regularly or as told by your health care provider.  Go to the restroom when you have the urge to go. Do not hold it in.  Take over-the-counter and prescription medicines only as told by your health care provider. These include any fiber supplements.  Practice pelvic floor retraining exercises, such as deep breathing while relaxing the lower abdomen and pelvic floor relaxation during bowel movements.  Watch your condition for any changes.  Keep all follow-up visits as told by your health care provider. This is important. Contact a health care provider if:  You have pain that gets worse.  You have a fever.  You do not have a bowel movement after 4 days.  You vomit.  You are not hungry.  You lose weight.  You are bleeding from the anus.  You have thin, pencil-like stools. Get help right away if:  You have a fever  and your symptoms suddenly get worse.  You leak stool or have blood in your stool.  Your abdomen is bloated.  You have severe pain in your abdomen.  You feel dizzy or you faint. This information is not intended to replace advice given to you by your health care provider. Make sure you discuss any questions you have with your health care provider. Document Released: 10/28/2003 Document Revised: 08/19/2015 Document Reviewed: 07/20/2015 Elsevier Interactive Patient Education  2018 [employer].
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
## 2017-12-16 ENCOUNTER — Encounter: Payer: Self-pay | Admitting: Adult Health

## 2017-12-16 ENCOUNTER — Ambulatory Visit (INDEPENDENT_AMBULATORY_CARE_PROVIDER_SITE_OTHER): Payer: Medicare Other | Admitting: Adult Health

## 2017-12-16 VITALS — BP 140/76 | HR 91 | Temp 97.9°F | Ht 63.0 in | Wt 122.0 lb

## 2017-12-16 DIAGNOSIS — R296 Repeated falls: Secondary | ICD-10-CM

## 2017-12-16 DIAGNOSIS — F0281 Dementia in other diseases classified elsewhere with behavioral disturbance: Secondary | ICD-10-CM | POA: Diagnosis not present

## 2017-12-16 DIAGNOSIS — R350 Frequency of micturition: Secondary | ICD-10-CM | POA: Diagnosis not present

## 2017-12-16 DIAGNOSIS — G301 Alzheimer's disease with late onset: Secondary | ICD-10-CM

## 2017-12-16 NOTE — Patient Instructions (Signed)
Alzheimer Disease Caregiver Guide A person who has Alzheimer disease may not be able to take care of himself or herself. He or she may need help with simple tasks. The tips below can help you care for the person. Memory loss and confusion If the person is confused or cannot remember things:  Stay calm.  Respond with a short answer.  Avoid correcting him or her in a way that sounds like scolding.  Try not to take it personally, even if he or she forgets your name.  Behavior changes The person may go through behavior changes. This can include depression, anxiety, anger, or seeing things that are not there. When behavior changes:  Try not to take behavior changes personally.  Stay calm and patient.  Do not argue or try to convince the person about a specific point.  Know that these changes are part of the disease process. Try to work through it.  Tips to lessen frustration  Make appointments and do daily tasks when the person is at his or her best.  Take your time. Simple tasks may take longer. Allow plenty of time to complete tasks.  Limit choices for the person.  Involve the person in what you are doing.  Stick to a routine.  Avoid new or crowded places, if possible.  Use simple words, short sentences, and a calm voice. Only give 1 direction at a time.  Buy clothes and shoes that are easy to put on and take off.  Let people help if they offer. Home safety  Keep floors clear. Remove rugs, magazine racks, and floor lamps.  Keep hallways well lit.  Put a handrail and nonslip mat in the bathtub or shower.  Put childproof locks on cabinets that have dangerous items in them. These items include medicine, alcohol, guns, toxic cleaning items, sharp tools, matches, or lighters.  Place locks on doors where the person cannot see or reach them. This helps the person to not wander out of the house and get lost.  Be prepared for emergencies. Keep a list of emergency phone  numbers and addresses in a handy area. Plans for the future  Talk about finances. ? Talk about money management. People with Alzheimer disease have trouble managing their money as the disease gets worse. ? Get help from professional advisors about financial and legal matters.  Talk about future care. ? Choose a power of attorney. This is someone who can make decisions for the person with Alzheimer disease when he or she can no longer do so. ? Talk about driving and when it is the right time to stop. The person's doctor can help with this. ? If the person lives alone, make sure he or she is safe. Some people need extra help at home. Other people need more care at a nursing home or care center. Support groups Some benefits of joining a support group include:  Learning ways to manage stress.  Sharing experiences with others.  Getting emotional comfort and support.  Learning new caregiving skills as the disease progresses.  Knowing what community resources are available and taking advantage of them.  Get help if:  The person has a fever.  The person has a sudden behavior change that does not get better with calming strategies.  The person is unable to manage his or her living situation.  The person threatens you or anyone else, including himself or herself.  You are no longer able to care for the person. This information is not   intended to replace advice given to you by your health care provider. Make sure you discuss any questions you have with your health care provider. Document Released: 04/23/2011 Document Revised: 07/07/2015 Document Reviewed: 03/21/2011 Elsevier Interactive Patient Education  2017 Elsevier Inc.  

## 2017-12-17 ENCOUNTER — Telehealth: Payer: Self-pay | Admitting: Adult Health

## 2017-12-17 NOTE — Telephone Encounter (Signed)
Called Somis @ [facility] to verify patient still in service with Palliative Care. Per Kathie Rhodes, Yes still in service with [facility]. Kathie Rhodes will call patient to set up visit. Also faxed most recent office visit.

## 2017-12-23 DIAGNOSIS — M1711 Unilateral primary osteoarthritis, right knee: Secondary | ICD-10-CM | POA: Diagnosis not present

## 2017-12-23 DIAGNOSIS — M1712 Unilateral primary osteoarthritis, left knee: Secondary | ICD-10-CM | POA: Diagnosis not present

## 2017-12-26 ENCOUNTER — Telehealth: Payer: Self-pay | Admitting: Internal Medicine

## 2017-12-26 ENCOUNTER — Other Ambulatory Visit: Payer: Self-pay | Admitting: Adult Health

## 2017-12-26 ENCOUNTER — Telehealth: Payer: Self-pay

## 2017-12-26 DIAGNOSIS — F02818 Dementia in other diseases classified elsewhere, unspecified severity, with other behavioral disturbance: Secondary | ICD-10-CM

## 2017-12-26 DIAGNOSIS — F0281 Dementia in other diseases classified elsewhere with behavioral disturbance: Secondary | ICD-10-CM

## 2017-12-26 DIAGNOSIS — G301 Alzheimer's disease with late onset: Principal | ICD-10-CM

## 2017-12-26 MED ORDER — ALPRAZOLAM 0.5 MG PO TABS: ORAL_TABLET | ORAL | 0 refills | Status: DC

## 2017-12-26 MED ORDER — ALPRAZOLAM 0.25 MG PO TABS: ORAL_TABLET | ORAL | 0 refills | Status: AC

## 2017-12-26 NOTE — Telephone Encounter (Addendum)
0.5mg  tablet for Alprazolam is on back order but the pharmacy has .25mg  or 1mg  tablets. Please send in new prescription.

## 2017-12-26 NOTE — Telephone Encounter (Signed)
Daughter, [name],  is requesting Palliative Care restart service with patient.  She recently tried to qualify patient for a federal  home care program, this did not work out. Needs help with daily care, and would like to continue previous conversation with Palliative about patient sleep patterns.  Left voicemail for  [name] at [facility], to check status of patient appointment request from 12-17-17.

## 2017-12-31 ENCOUNTER — Telehealth: Payer: Self-pay

## 2017-12-31 NOTE — Telephone Encounter (Signed)
Phone call placed to patient/family to offer to schedule a follow up with Palliative Care. Visit scheduled for Thursday 01/02/18

## 2018-01-01 ENCOUNTER — Other Ambulatory Visit: Payer: Medicare Other | Admitting: Nurse Practitioner

## 2018-01-01 ENCOUNTER — Telehealth: Payer: Self-pay

## 2018-01-01 DIAGNOSIS — F419 Anxiety disorder, unspecified: Secondary | ICD-10-CM

## 2018-01-01 DIAGNOSIS — Z515 Encounter for palliative care: Secondary | ICD-10-CM

## 2018-01-01 DIAGNOSIS — F22 Delusional disorders: Secondary | ICD-10-CM

## 2018-01-01 DIAGNOSIS — R440 Auditory hallucinations: Secondary | ICD-10-CM

## 2018-01-01 DIAGNOSIS — F0391 Unspecified dementia with behavioral disturbance: Secondary | ICD-10-CM

## 2018-01-01 DIAGNOSIS — R269 Unspecified abnormalities of gait and mobility: Secondary | ICD-10-CM | POA: Diagnosis not present

## 2018-01-01 DIAGNOSIS — R634 Abnormal weight loss: Secondary | ICD-10-CM

## 2018-01-01 NOTE — Telephone Encounter (Signed)
Phone call returned to patient's daughter who requested that scheduled visit be moved up to today. Daughter contacted and rescheduled visit for today.

## 2018-01-02 ENCOUNTER — Other Ambulatory Visit: Payer: Medicare Other | Admitting: Nurse Practitioner

## 2018-01-02 ENCOUNTER — Encounter: Payer: Self-pay | Admitting: Nurse Practitioner

## 2018-01-02 NOTE — Progress Notes (Signed)
[facility] Telephone: [phone] Fax: [phone]  PATIENT NAME: [name] DOB: June 05, 1922 MRN: [id number]  PRIMARY CARE PROVIDER:   Unk Pinto, MD  REFERRING PROVIDER:  Unk Pinto, MD [address] [place], [place] [address]  RESPONSIBLE PARTY:   Tracyann Duffell (daughter) home: [phone] cell: [phone]   HISTORY OF PRESENT ILLNESS:  [name] is a 84 y.o. year old female with multiple medical problems including dementia with behavioral disturbance, A/V hallucinations, paranoid delusions, gait disturbance, combativeness, DM, HTN. Palliative Care was asked to help with symptom management and to  address goals of care.    ASSESSMENT/PLAN/RECCOMENDATIONS  -Late onset alzheimer's disease with behavioral disturbance; A/V hallucinations; delusions, agitation and combativeness -near blindness -anxiety -gait disorder -decreased appetite -continue supportive care; patient oriented to self only; -daughter provides 100% of ADL's -patient with very limited movement and daughter is essentially transferring patient alone from bed to chair -continue 0.'25mg'$  1hour before sleep PRN -continue MVI -would consider low dose(2.'5mg'$ ) olanzapine at bedtime for hallucinations -Patient needs hospital bed and wheelchair to assist with transfer's and mobility -supplement of choice 1-2x day  -T2DM -hypothyroidism -constipation -chronic pain -not on medications; does not check CBG's -daughter does not report symptoms of hypoglycemia -continue current levothyroxine -check A1C -check TSH -consider schedule acetaminophen 1,'000mg'$  BID -consider PRN Ultram  ACP -brief discussion will be addessed in more detail in the near future    I spent 75 minutes providing this consultation,  from 12 to 13:15. More than 50% of the time in this consultation was spent coordinating communication.    CODE STATUS: Full  PPS: 30% HOSPICE  ELIGIBILITY/DIAGNOSIS: TBD  PAST MEDICAL HISTORY:  Past Medical History:  Diagnosis Date  . Allergy    "all year round" (05/16/2017)  . Alzheimer disease ([place]) 12/11/2016   "not real bad; developing slow" (05/16/2017)  . Arthritis    "knees" (05/16/2017)  . Depression   . DJD (degenerative joint disease)   . GERD (gastroesophageal reflux disease)   . Glaucoma   . Hepatitis C   . Hyperlipidemia   . Hypertension   . Hypothyroidism   . Pneumonia ~ 2016; 05/2017  . Seizures ([place]) ~ 2013X 1   "she was real sick and admitted to hospital; had sz at home"  . Type II diabetes mellitus ([place])   . Vitamin D deficiency     SOCIAL HX:  Social History   Tobacco Use  . Smoking status: Never Smoker  . Smokeless tobacco: Former Systems developer    Types: Snuff  Substance Use Topics  . Alcohol use: No    ALLERGIES: No Known Allergies   PERTINENT MEDICATIONS:  Outpatient Encounter Medications as of 01/01/2018  Medication Sig  . ACCU-CHEK SOFTCLIX LANCETS lancets Accu-Chek Softclix Lancets  . ALPRAZolam (XANAX) 0.25 MG tablet Take 1-2 tabs as needed 1 hour prior to sleep.  Marland Kitchen aspirin EC 81 MG tablet Take 81 mg by mouth daily.  . Black Currant Seed Oil 500 MG CAPS Take 1 capsule by mouth 2 (two) times daily.  . blood glucose meter kit and supplies Check sugars 1 daily Dispense based insurance preference. E11.9  . Blood Glucose Monitoring Suppl (ACCU-CHEK AVIVA PLUS) w/Device KIT accu-chek    kit aviva pl  . cetirizine (ZYRTEC) 10 MG tablet Take 10 mg by mouth daily.  Marland Kitchen glipiZIDE (GLUCOTROL) 5 MG tablet Take 0.5 tablets (2.5 mg total) by mouth 2 (two) times daily before a meal.  . glucose blood test strip Accu-Chek  Aviva Plus test strips  . lactulose (CHRONULAC) 10 GM/15ML solution Take 30 mLs (20 g total) by mouth 2 (two) times daily as needed for mild constipation or moderate constipation.  Marland Kitchen levothyroxine (SYNTHROID, LEVOTHROID) 50 MCG tablet TAKE 1 TABLET BY MOUTH  DAILY  . lidocaine (LIDODERM) 5 % Place  1 patch onto the skin daily. Remove & Discard patch within 12 hours or as directed by MD  . loratadine (CLARITIN) 10 MG tablet Take 10 mg by mouth daily.  . Multiple Vitamin (MULTI-VITAMIN DAILY PO) Take by mouth.  . NON FORMULARY Apply 1 application topically See admin instructions. Horse linament  As needed  . Polyethylene Glycol 3350 (MIRALAX PO) Take by mouth.  . silver sulfADIAZINE (SILVADENE) 1 % cream APPLY 1 APPLICATION TOPICALLY DAILY.   No facility-administered encounter medications on file as of 01/01/2018.     PHYSICAL EXAM:   General: NAD, frail appearing, thin Cardiovascular: regular rate and rhythm Pulmonary: clear ant fields Abdomen: soft, nontender, + bowel sounds GU: no suprapubic tenderness Extremities: no edema, no joint deformities Skin: no rashes Neurological: Weakness but otherwise non-focal ;oriented to self only  Julie Limburg G [place], NP

## 2018-01-06 ENCOUNTER — Other Ambulatory Visit: Payer: Medicare Other | Admitting: Nurse Practitioner

## 2018-01-06 ENCOUNTER — Other Ambulatory Visit: Payer: Self-pay | Admitting: Adult Health

## 2018-01-06 ENCOUNTER — Other Ambulatory Visit: Payer: Medicare Other

## 2018-01-06 ENCOUNTER — Encounter: Payer: Self-pay | Admitting: Nurse Practitioner

## 2018-01-06 ENCOUNTER — Telehealth: Payer: Self-pay | Admitting: Internal Medicine

## 2018-01-06 DIAGNOSIS — Z7189 Other specified counseling: Secondary | ICD-10-CM | POA: Diagnosis not present

## 2018-01-06 DIAGNOSIS — R269 Unspecified abnormalities of gait and mobility: Secondary | ICD-10-CM | POA: Diagnosis not present

## 2018-01-06 DIAGNOSIS — Z515 Encounter for palliative care: Secondary | ICD-10-CM | POA: Diagnosis not present

## 2018-01-06 DIAGNOSIS — R350 Frequency of micturition: Secondary | ICD-10-CM | POA: Diagnosis not present

## 2018-01-06 MED ORDER — OLANZAPINE 2.5 MG PO TABS: 2.5000 mg | ORAL_TABLET | Freq: Every day | ORAL | 2 refills | Status: DC

## 2018-01-06 NOTE — Progress Notes (Signed)
Per palliative care consult recommendations, low dose olanzapine 2.5 mg daily HS ordered, hospital bed and other assistive devices will be coordinated. Antipsychotics and risks associated were discussed at last OV. Patient family will be contacted by our staff to update. Follow up as needed for further falls/confusion or as scheduled. Has in-home follow up scheduled by palliative care NP in 1 month.

## 2018-01-06 NOTE — Telephone Encounter (Signed)
Called Stephanie SwazilandJordan, Palliative Care Nurse, states; Patient is Palliative Care. PCP will need to order any  prescritptions and DME equipement.

## 2018-01-08 LAB — URINALYSIS W MICROSCOPIC + REFLEX CULTURE
BILIRUBIN URINE: NEGATIVE
Bacteria, UA: NONE SEEN /HPF
Glucose, UA: NEGATIVE
Hgb urine dipstick: NEGATIVE
Hyaline Cast: NONE SEEN /LPF
Ketones, ur: NEGATIVE
NITRITES URINE, INITIAL: NEGATIVE
PROTEIN: NEGATIVE
SQUAMOUS EPITHELIAL / LPF: NONE SEEN /HPF (ref <=5)
Specific Gravity, Urine: 1.011 (ref 1.001–1.03)
pH: 5.5 (ref 5.0–8.0)

## 2018-01-08 LAB — URINE CULTURE
MICRO NUMBER:: 91424289
RESULT: NO GROWTH
SPECIMEN QUALITY:: ADEQUATE

## 2018-01-08 LAB — CULTURE INDICATED

## 2018-01-10 NOTE — Progress Notes (Signed)
[facility] Telephone: [phone] Fax: [phone]  PATIENT NAME: [name] DOB: [birth year] MRN: [id number]  PRIMARY CARE PROVIDER:   Unk Pinto, MD  REFERRING PROVIDER:  Unk Pinto, MD [address] [place], [place] [address]  RESPONSIBLE PARTY:   Velencia Lenart (daughter) home: [phone] cell: [phone]   HISTORY OF PRESENT ILLNESS:  [name] is a 84 y.o. year old female with multiple medical problems including dementia with behavioral disturbance, A/V hallucinations, paranoid delusions, gait disturbance, combativeness, DM, HTN. Palliative Care was asked to help with symptom management and to  address goals of care.   Interim History: no significant changes in patient's status; Family requested meeting to discuss goals of care   ASSESSMENT/PLAN/RECOMMENDATIONS  -Late onset alzheimer's disease with behavioral disturbance; A/V hallucinations; delusions, agitation and combativeness -near blindness -anxiety -gait disorder -decreased appetite -continue supportive care; patient oriented to self only; -daughter provides 100% of ADL's -patient with very limited movement and daughter is essentially transferring patient alone from bed to chair -continue 0.15m 1hour before sleep PRN -continue MVI -would consider low dose(2.563m olanzapine at bedtime for hallucinations,agitation and sleep   -T2DM -hypothyroidism -constipation -chronic pain -not on medications; does not check CBG's -daughter does not report symptoms of hypoglycemia -continue current levothyroxine -continue same  ACP -detailed discussion with primary caregiver (daughter) another daughter and son Present -DNR-comfort, MOST    I spent 60 minutes providing this consultation,  from 13:00 to 14:00. More than 50% of the time in this consultation was spent coordinating communication.   CODE STATUS: Full  PPS: 30% HOSPICE ELIGIBILITY/DIAGNOSIS:  TBD  PAST MEDICAL HISTORY:  Past Medical History:  Diagnosis Date  . Allergy    "all year round" (05/16/2017)  . [place]   "not real bad; developing slow" (05/16/2017)  . Arthritis    "knees" (05/16/2017)  . Depression   . DJD (degenerative joint disease)   . GERD (gastroesophageal reflux disease)   . Glaucoma   . Hepatitis C   . Hyperlipidemia   . Hypertension   . Hypothyroidism   . Pneumonia ~ 2016; 05/2017  . Seizures ([place]~ 2013X 1   "she was real sick and admitted to hospital; had sz at home"  . Type II diabetes mellitus ([facility]  . Vitamin D deficiency     SOCIAL HX:  Social History   Tobacco Use  . Smoking status: Never Smoker  . Smokeless tobacco: Former Us[employer]s developer  Types: Snuff  Substance Use Topics  . Alcohol use: No    ALLERGIES: No Known Allergies   PERTINENT MEDICATIONS:  Outpatient Encounter Medications as of 01/06/2018  Medication Sig  . ACCU-CHEK SOFTCLIX LANCETS lancets Accu-Chek Softclix Lancets  . ALPRAZolam (XANAX) 0.25 MG tablet Take 1-2 tabs as needed 1 hour prior to sleep.  . Marland Kitchenspirin EC 81 MG tablet Take 81 mg by mouth daily.  . Black Currant Seed Oil 500 MG CAPS Take 1 capsule by mouth 2 (two) times daily.  . blood glucose meter kit and supplies Check sugars 1 daily Dispense based insurance preference. E11.9  . Blood Glucose Monitoring Suppl (ACCU-CHEK AVIVA PLUS) w/Device KIT accu-chek    kit aviva pl  . cetirizine (ZYRTEC) 10 MG tablet Take 10 mg by mouth daily.  . Marland KitchenlipiZIDE (GLUCOTROL) 5 MG tablet Take 0.5 tablets (2.5 mg total) by mouth 2 (two) times daily before a meal.  . glucose blood test strip Accu-Chek Aviva Plus test strips  . lactulose (  CHRONULAC) 10 GM/15ML solution Take 30 mLs (20 g total) by mouth 2 (two) times daily as needed for mild constipation or moderate constipation.  Marland Kitchen levothyroxine (SYNTHROID, LEVOTHROID) 50 MCG tablet TAKE 1 TABLET BY MOUTH  DAILY  . lidocaine (LIDODERM) 5 % Place 1 patch onto the skin  daily. Remove & Discard patch within 12 hours or as directed by MD  . loratadine (CLARITIN) 10 MG tablet Take 10 mg by mouth daily.  . Multiple Vitamin (MULTI-VITAMIN DAILY PO) Take by mouth.  . NON FORMULARY Apply 1 application topically See admin instructions. Horse linament  As needed  . OLANZapine (ZYPREXA) 2.5 MG tablet Take 1 tablet (2.5 mg total) by mouth at bedtime.  . Polyethylene Glycol 3350 (MIRALAX PO) Take by mouth.  . silver sulfADIAZINE (SILVADENE) 1 % cream APPLY 1 APPLICATION TOPICALLY DAILY.   No facility-administered encounter medications on file as of 01/06/2018.     PHYSICAL EXAM:   General: NAD, frail appearing, thin Cardiovascular: regular rate and rhythm Pulmonary: clear ant fields Abdomen: soft, nontender, + bowel sounds GU: no suprapubic tenderness Extremities: no edema, no joint deformities Skin: no rashes Neurological: Weakness but otherwise non-focal ;oriented to self only  Dejanique Ruehl G [place], NP

## 2018-01-11 ENCOUNTER — Other Ambulatory Visit: Payer: Self-pay | Admitting: Physician Assistant

## 2018-01-13 ENCOUNTER — Telehealth: Payer: Self-pay

## 2018-01-13 NOTE — Telephone Encounter (Signed)
Patient's daughter contacted on call service on 01/11/18. Spoke with Mikle Boswortharlos RN regarding patient's behaviors including restlessness and visual hallucinations. Patient's daughter had questions regarding giving another dose of olanzapine. Daughter was directed to call PCP office.  Follow up phone call placed to daughter today to check on patient and to offer visit with NP.  Per daughter, patient had not slept from Thursday or Friday and experienced visual hallucinations. Daughter stated patient finally slept Saturday night and most of the day on Sunday. She administered her nightly dose at 6pm on Sunday and patient was able to sleep Sunday night. Patient is back at baseline. Daughter declined need for a visit but would like to know if it is possible to give an extra dose if patient should experience this again. Will follow up with Palliative NP for direction.

## 2018-01-28 ENCOUNTER — Other Ambulatory Visit: Payer: Self-pay | Admitting: Adult Health

## 2018-01-29 ENCOUNTER — Ambulatory Visit: Payer: Medicare Other | Admitting: Podiatry

## 2018-01-30 ENCOUNTER — Telehealth: Payer: Self-pay | Admitting: Internal Medicine

## 2018-01-30 ENCOUNTER — Other Ambulatory Visit: Payer: Medicare Other | Admitting: Internal Medicine

## 2018-01-30 ENCOUNTER — Telehealth: Payer: Self-pay

## 2018-01-30 DIAGNOSIS — Z515 Encounter for palliative care: Secondary | ICD-10-CM

## 2018-01-30 NOTE — Telephone Encounter (Signed)
9:10am: F/U TC to daughter Adela LankJacqueline. Adela LankJacqueline mentions that patient was tearful and extremely anxious yesterday with hallucinations (seing dead relatives; asking why her husband was leaving her, etc). Also with insomnia last few nights. Patent has been receiving Zyprexa 2.5mg  qhs which had been working well, until recently. Daughter gave patient an additional 2.5mg  (total of 5mg ) last night with patient's relief of anxiety and sleeping through the night. She awoke well this am and was calm. Daughter states that she is not alarmed by patient's baseline hallucinations. It is only a problem is patient becomes tearful, anxious, and distraught.  PLAN: Instructed daughter to increase bedtime dose of Zyprexa to 5mg  qhs. To contact us if excessive somnolence or change in symptoms.  Holly BodilyMary Rosealie Reach NP-C [phone]

## 2018-01-30 NOTE — Progress Notes (Signed)
Please see TC documented for this day. Patient not seen in the home. Medications adjustment for escalation of agitation, hallucinations, anxiety r/t advanced dementia. Zyprexa increased from 2.5mg  to 5mg  qhs. Daughter Adela LankJacqueline deferred NP home visit.  Holly BodilyMary Wilbon Obenchain NP-C

## 2018-01-30 NOTE — Telephone Encounter (Signed)
Entry for 01/29/18:  Received phone call from patient's daughter. Patient has been restless with visual hallucinations. Patient has not been sleeping over the past 24 hours. Daughter inquired if she could give an additional dose of Zyprexa 2.5 mg today and requested a follow up visit. Stephanie SwazilandJordan NP made aware and directed that patient could have an additional 2.5mg  today and a full 5 mg tomorrow. Daughter made aware and visit scheduled with Corrie DandyMary NP for 01/30/18.

## 2018-02-13 ENCOUNTER — Telehealth: Payer: Self-pay

## 2018-02-13 NOTE — Telephone Encounter (Signed)
VM left to schedule a follow up visit with Palliative Care.

## 2018-02-14 ENCOUNTER — Other Ambulatory Visit: Payer: Medicare Other | Admitting: Nurse Practitioner

## 2018-02-14 DIAGNOSIS — R634 Abnormal weight loss: Secondary | ICD-10-CM

## 2018-02-14 DIAGNOSIS — R269 Unspecified abnormalities of gait and mobility: Secondary | ICD-10-CM | POA: Diagnosis not present

## 2018-02-14 DIAGNOSIS — Z515 Encounter for palliative care: Secondary | ICD-10-CM | POA: Diagnosis not present

## 2018-02-14 DIAGNOSIS — F419 Anxiety disorder, unspecified: Secondary | ICD-10-CM

## 2018-02-17 ENCOUNTER — Other Ambulatory Visit: Payer: Self-pay | Admitting: Physician Assistant

## 2018-02-18 NOTE — Progress Notes (Signed)
[facility] Telephone: [phone] Fax: [phone]  PATIENT NAME: [name] DOB: 09/05/1922 MRN: [id number]  PRIMARY CARE PROVIDER:   Unk Pinto, MD  REFERRING PROVIDER:  Unk Pinto, MD [address] [place], [place] [address]  RESPONSIBLE PARTY:   Catalyna Reilly (daughter) home: [phone] cell: [phone]   HISTORY OF PRESENT ILLNESS:  [name] is a 85 y.o. year old female with multiple medical problems including dementia with behavioral disturbance, A/V hallucinations, paranoid delusions, gait disturbance, combativeness, DM, HTN. Palliative Care was asked to help with symptom management and to  address goals of care.   Interim History: increase in zyprexa made patient very sleepy; patient's mobility is still declining; requiring 100% assistance with transfers and ADL's.  ASSESSMENT/PLAN/RECOMMENDATIONS  -Late onset alzheimer's disease with behavioral disturbance; A/V hallucinations; delusions, agitation and combativeness -near blindness -anxiety -gait disorder -decreased appetite -continue supportive care; patient oriented to self only; -daughter provides 100% of ADL's -patient with very limited movement and daughter is essentially transferring patient alone from bed to chair -continue olanzapine 2.32m at night -continue 0.219m1hour before sleep PRN -continue MVI -restart olanzapine at 2.40m540mt bedtime -patient needs hospital bed and wheelchair.  -T2DM -hypothyroidism -constipation -chronic pain -not on medications; does not check CBG's -daughter does not report symptoms of hypoglycemia -continue current levothyroxine -continue same  ACP -detailed discussion with primary caregiver (daughter) another daughter and son Present -DNR-comfort, MOST    I spent 60 minutes providing this consultation,  from 13:00 to 14:00. More than 50% of the time in this consultation was spent coordinating communication.    CODE STATUS: Full  PPS: 30% HOSPICE ELIGIBILITY/DIAGNOSIS: TBD  PAST MEDICAL HISTORY:  Past Medical History:  Diagnosis Date  . Allergy    "all year round" (05/16/2017)  . [place] disease [place]   "not real bad; developing slow" (05/16/2017)  . Arthritis    "knees" (05/16/2017)  . Depression   . DJD (degenerative joint disease)   . GERD (gastroesophageal reflux disease)   . Glaucoma   . Hepatitis C   . Hyperlipidemia   . Hypertension   . Hypothyroidism   . Pneumonia ~ 2016; 05/2017  . Seizures ([facility] 2013X 1   "she was real sick and admitted to hospital; had sz at home"  . Type II diabetes mellitus ([facility] . Vitamin D deficiency     SOCIAL HX:  Social History   Tobacco Use  . Smoking status: Never Smoker  . Smokeless tobacco: Former UseSystems developer Types: Snuff  Substance Use Topics  . Alcohol use: No    ALLERGIES: No Known Allergies   PERTINENT MEDICATIONS:  Outpatient Encounter Medications as of 02/14/2018  Medication Sig  . ACCU-CHEK SOFTCLIX LANCETS lancets USE AS DIRECTED TO CHECK BLOOD SUGAR  . ALPRAZolam (XANAX) 0.25 MG tablet Take 1-2 tabs as needed 1 hour prior to sleep.  . aMarland Kitchenpirin EC 81 MG tablet Take 81 mg by mouth daily.  . Black Currant Seed Oil 500 MG CAPS Take 1 capsule by mouth 2 (two) times daily.  . blood glucose meter kit and supplies Check sugars 1 daily Dispense based insurance preference. E11.9  . Blood Glucose Monitoring Suppl (ACCU-CHEK AVIVA PLUS) w/Device KIT accu-chek    kit aviva pl  . cetirizine (ZYRTEC) 10 MG tablet Take 10 mg by mouth daily.  . gMarland KitchenipiZIDE (GLUCOTROL) 5 MG tablet Take 0.5 tablets (2.5 mg total) by mouth 2 (two) times daily before a meal.  .  lactulose (CHRONULAC) 10 GM/15ML solution Take 30 mLs (20 g total) by mouth 2 (two) times daily as needed for mild constipation or moderate constipation.  Marland Kitchen levothyroxine (SYNTHROID, LEVOTHROID) 50 MCG tablet TAKE 1 TABLET BY MOUTH  DAILY  . lidocaine (LIDODERM) 5 % Place 1 patch  onto the skin daily. Remove & Discard patch within 12 hours or as directed by MD  . loratadine (CLARITIN) 10 MG tablet Take 10 mg by mouth daily.  . Multiple Vitamin (MULTI-VITAMIN DAILY PO) Take by mouth.  . NON FORMULARY Apply 1 application topically See admin instructions. Horse linament  As needed  . OLANZapine (ZYPREXA) 2.5 MG tablet TAKE 1 TABLET (2.5 MG TOTAL) BY MOUTH AT BEDTIME. (Patient taking differently: Take 5 mg by mouth at bedtime. Dosage increase to 50m qhs (two of her 2.531mtabs))  . Polyethylene Glycol 3350 (MIRALAX PO) Take by mouth.  . silver sulfADIAZINE (SILVADENE) 1 % cream APPLY 1 APPLICATION TOPICALLY DAILY.  . [DISCONTINUED] glucose blood (ACCU-CHEK AVIVA PLUS) test strip Check Blood Sugar as Directed   No facility-administered encounter medications on file as of 02/14/2018.     PHYSICAL EXAM:   General: NAD, frail appearing, thin Cardiovascular: regular rate and rhythm Pulmonary: clear ant fields Abdomen: soft, nontender, + bowel sounds GU: no suprapubic tenderness Extremities: no edema, no joint deformities Skin: no rashes Neurological: Weakness but otherwise non-focal ;oriented to self only  Stephanie G JoMartiniqueNP

## 2018-02-19 ENCOUNTER — Telehealth: Payer: Self-pay

## 2018-02-19 NOTE — Telephone Encounter (Signed)
Phone call placed to patient's daughter to check in on patient. Per daughter, patient has been sleeping better with increased dosage of medication. Daughter expressed appreciation, No further concerns at this time

## 2018-02-25 DIAGNOSIS — R269 Unspecified abnormalities of gait and mobility: Secondary | ICD-10-CM | POA: Diagnosis not present

## 2018-02-25 DIAGNOSIS — G8929 Other chronic pain: Secondary | ICD-10-CM | POA: Diagnosis not present

## 2018-02-27 ENCOUNTER — Ambulatory Visit (INDEPENDENT_AMBULATORY_CARE_PROVIDER_SITE_OTHER): Payer: Medicare Other | Admitting: Adult Health

## 2018-02-27 ENCOUNTER — Encounter: Payer: Self-pay | Admitting: Adult Health

## 2018-02-27 VITALS — BP 132/80 | HR 72 | Temp 98.1°F

## 2018-02-27 DIAGNOSIS — R410 Disorientation, unspecified: Secondary | ICD-10-CM | POA: Diagnosis not present

## 2018-02-27 DIAGNOSIS — F0281 Dementia in other diseases classified elsewhere with behavioral disturbance: Secondary | ICD-10-CM

## 2018-02-27 DIAGNOSIS — E46 Unspecified protein-calorie malnutrition: Secondary | ICD-10-CM | POA: Diagnosis not present

## 2018-02-27 DIAGNOSIS — R300 Dysuria: Secondary | ICD-10-CM

## 2018-02-27 DIAGNOSIS — G301 Alzheimer's disease with late onset: Secondary | ICD-10-CM

## 2018-02-27 DIAGNOSIS — F02818 Dementia in other diseases classified elsewhere, unspecified severity, with other behavioral disturbance: Secondary | ICD-10-CM

## 2018-02-27 DIAGNOSIS — Z7189 Other specified counseling: Secondary | ICD-10-CM

## 2018-02-27 MED ORDER — DOXYCYCLINE HYCLATE 100 MG PO CAPS: ORAL_CAPSULE | ORAL | 0 refills | Status: AC

## 2018-02-27 MED ORDER — QUETIAPINE FUMARATE 25 MG PO TABS: 25.0000 mg | ORAL_TABLET | Freq: Every day | ORAL | 0 refills | Status: DC

## 2018-02-27 NOTE — Patient Instructions (Addendum)
Hospice °Hospice is a service that is designed to provide people who are terminally ill and their families with medical, spiritual, and psychological support. Its aim is to improve your quality of life by keeping you as comfortable as possible in the final stages of life. °Who will be my providers when I begin hospice care? °Hospice teams often include: °· A nurse. °· A doctor. The hospice doctor will be available for your care, but you can include your regular doctor or nurse practitioner. °· A social worker. °· A counselor. °· A religious leader (such as a chaplain). °· A dietitian. °· Therapists. °· Trained volunteers who can help with care. °What services does hospice provide? °Hospice services can vary depending on the center or organization. Generally, they include: °· Ways to keep you comfortable, such as: °? Providing care in your home or in a home-like setting. °? Working with your family and friends to help meet your needs. °? Allowing you to enjoy the support of loved ones by receiving much of your basic care from family and friends. °· Pain relief and symptom management. The staff will supply all necessary medicines and equipment so that you can stay comfortable and alert enough to enjoy the company of your friends and family. °· Visits or care from a nurse and doctor. This may include 24-hour on-call services. °· Companionship when you are alone. °· Allowing you and your family to rest. Hospice staff may do light housekeeping, prepare meals, and run errands. °· Counseling. They will make sure your emotional, spiritual, and social needs are being met, as well as those needs of your family members. °· Spiritual care. This will be individualized to meet your needs and your family's needs. It may involve: °? Helping you and your family understand the dying process. °? Helping you say goodbye to your family and friends. °? Performing a specific religious ceremony or ritual. °· Massage. °· Nutrition  therapy. °· Physical and occupational therapy. °· Short-term inpatient care, if something cannot be managed in the home. °· Art or music therapy. °· Bereavement support for grieving family members. °When should hospice care begin? °Most people who use hospice are believed to have less than 6 months to live. °· Your family and health care providers can help you decide when hospice services should begin. °· If you live longer than 6 months but your condition does not improve, your doctor may be able to approve you for continued hospice care. °· If your condition improves, you may discontinue the program. °What should I consider before selecting a program? °Most hospice programs are run by nonprofit, independent organizations. Some are affiliated with hospitals, nursing homes, or home health care agencies. Hospice programs can take place in your home or at a hospice center, hospital, or skilled nursing facility. When choosing a hospice program, ask the following questions: °· What services are available to me? °· What services will be offered to my loved ones? °· How involved will my loved ones be? °· How involved will my health care provider be? °· Who makes up the hospice care team? How are they trained or screened? °· How will my pain and symptoms be managed? °· If my circumstances change, can the services be provided in a different setting, such as my home or in the hospital? °· Is the program reviewed and licensed by the state or certified in some other way? °· What does it cost? Is it covered by insurance? °· If I choose a hospice   center or nursing home, where is the hospice center located? Is it convenient for family and friends? °· If I choose a hospice center or nursing home, can my family and friends visit any time? °· Will you provide emotional and spiritual support? °· Who can my family call with questions? °Where can I learn more about hospice? °You can learn about existing hospice programs in your area  from your health care providers. You can also read more about hospice online. The websites of the following organizations have helpful information: °· National Hospice and Palliative Care Organization (NHPCO): www.nhpco.org °· National Association for Home Care & Hospice (NAHC): www.nahc.org °· Hospice Foundation of America (HFA): www.hospicefoundation.org °· American Cancer Society (ACS): www.cancer.org °· Hospice Net: www.hospicenet.org °· Visiting Nurse Associations of America (VNAA): www.vnaa.org °You may also find more information by contacting the following agencies: °· A local agency on aging. °· Your local United Way chapter. °· Your state's department of health or social services. °Summary °· Hospice is a service that is designed to provide people who are terminally ill and their families with medical, spiritual, and psychological support. °· Hospice aims to improve your quality of life by keeping you as comfortable as possible in the final stages of life. °· Hospice teams often include a doctor, nurse, social worker, counselor, religious leader,dietitian, therapists, and volunteers. °· Hospice care generally includes medicine for symptom management, visits from doctors and nurses, physical and occupational therapy, nutrition counseling, spiritual and emotional counseling, caregiver support, and bereavement support for grieving family members. °· Hospice programs can take place in your home or at a hospice center, hospital, or skilled nursing facility. °This information is not intended to replace advice given to you by your health care provider. Make sure you discuss any questions you have with your health care provider. °Document Released: 05/18/2003 Document Revised: 02/21/2016 Document Reviewed: 02/21/2016 °Elsevier Interactive Patient Education © 2019 Elsevier Inc. ° °

## 2018-02-27 NOTE — Progress Notes (Signed)
Assessment and Plan:  [name] was seen today for dysuria and hallucinations.  Diagnoses and all orders for this visit:  Confusion and disorientation/ Dysuria Stop zyprexa due to lack of perceived benefit; discussed Seroquel and will trial 25 mg at HS instead. Labs deferred in light of decision for comfort care/symptom management only, will prescribe doxycycline due to no recent renal labs and cover possible URI, UTI -     Urinalysis w microscopic + reflex culture -     doxycycline (VIBRAMYCIN) 100 MG capsule; Take 1 capsule twice daily with food -     QUEtiapine (SEROQUEL) 25 MG tablet; Take 1 tablet (25 mg total) by mouth at bedtime. Take instead of zyprexa.  Late onset Alzheimer's disease with behavioral disturbance ([place]) Discussed disease progression, failure to thrive with increasing confusion, family members desiring comfort care only, will proceed with hospice referral for evaluation.   Goals of care, counseling/discussion Family requests comfort care only, avoid blood draws as very distressing for family and wouldn't pursue aggressively, desire no further hospitalizations  Further disposition pending results of labs. Discussed med's effects and SE's.   Over 30 minutes of exam, counseling, chart review, and critical decision making was performed.   Future Appointments  Date Time Provider [place]  03/19/2018  2:45 PM Vicie Mutters, PA-C [facility] None  06/23/2018  2:00 PM Vicie Mutters, PA-C [facility] None  10/23/2018 11:15 AM Vicie Mutters, PA-C [facility] None    ------------------------------------------------------------------------------------------------------------------   HPI BP 132/80   Pulse 72   Temp 98.1 F (36.7 C)   SpO2 97%   85 y.o.female with alzheimers, T2DM, mild malnutrition presents accompanied by her 2 daughters for complaints of dysuria x 1 day, non-productive cough x 1 week and ongoing concerns with her generally being more confused  than usual, asking to "go home," not sleeping well, very poor appetite and not drinking much fluids. We consulted palliative care previously and they have been helpful in coordinating DME and recommended zyprexa 2.5 mg which patient's family called to report was not helpful, we did increase to 5 mg but still no perceived benefit. We discussed goals of care for patient, and today her daughters share that they do not wish to pursue aggressive treatment, and "if it's her time to go, we are ready for that." They wish for her to rest well and be comfortable, but in agreement that they do not want her sleeping due to sedation. We discussed that she may be ready for transition to hospice care in light of advanced dementia, failure to thrive, and perference for comfort care only. In light of this decision, and her typical distress with lab draws, family declines labs that were initially discussed.   She is unable to stand independently for weight check today.  Wt Readings from Last 3 Encounters:  12/16/17 122 lb (55.3 kg)  11/21/17 128 lb 9.6 oz (58.3 kg)  10/18/17 124 lb 9.6 oz (56.5 kg)    Past Medical History:  Diagnosis Date  . Allergy    "all year round" (05/16/2017)  . Alzheimer disease ([place]) 12/11/2016   "not real bad; developing slow" (05/16/2017)  . Arthritis    "knees" (05/16/2017)  . Depression   . DJD (degenerative joint disease)   . GERD (gastroesophageal reflux disease)   . Glaucoma   . Hepatitis C   . Hyperlipidemia   . Hypertension   . Hypothyroidism   . Pneumonia ~ 2016; 05/2017  . Seizures ([place]) ~ 2013X 1   "she was  real sick and admitted to hospital; had sz at home"  . Type II diabetes mellitus ([place])   . Vitamin D deficiency      No Known Allergies  Current Outpatient Medications on File Prior to Visit  Medication Sig  . ACCU-CHEK SOFTCLIX LANCETS lancets USE AS DIRECTED TO CHECK BLOOD SUGAR  . aspirin EC 81 MG tablet Take 81 mg by mouth daily.  . Black Currant Seed Oil 500  MG CAPS Take 1 capsule by mouth 2 (two) times daily.  . blood glucose meter kit and supplies Check sugars 1 daily Dispense based insurance preference. E11.9  . Blood Glucose Monitoring Suppl (ACCU-CHEK AVIVA PLUS) w/Device KIT accu-chek    kit aviva pl  . cetirizine (ZYRTEC) 10 MG tablet Take 10 mg by mouth daily.  Marland Kitchen glipiZIDE (GLUCOTROL) 5 MG tablet Take 0.5 tablets (2.5 mg total) by mouth 2 (two) times daily before a meal.  . glucose blood test strip Check Blood Sugar daily  . lactulose (CHRONULAC) 10 GM/15ML solution Take 30 mLs (20 g total) by mouth 2 (two) times daily as needed for mild constipation or moderate constipation.  Marland Kitchen levothyroxine (SYNTHROID, LEVOTHROID) 50 MCG tablet TAKE 1 TABLET BY MOUTH  DAILY  . lidocaine (LIDODERM) 5 % Place 1 patch onto the skin daily. Remove & Discard patch within 12 hours or as directed by MD  . loratadine (CLARITIN) 10 MG tablet Take 10 mg by mouth daily.  . Multiple Vitamin (MULTI-VITAMIN DAILY PO) Take by mouth.  . NON FORMULARY Apply 1 application topically See admin instructions. Horse linament  As needed  . Polyethylene Glycol 3350 (MIRALAX PO) Take by mouth.  . silver sulfADIAZINE (SILVADENE) 1 % cream APPLY 1 APPLICATION TOPICALLY DAILY.  Marland Kitchen ALPRAZolam (XANAX) 0.25 MG tablet Take 1-2 tabs as needed 1 hour prior to sleep. (Patient not taking: Reported on 02/27/2018)   No current facility-administered medications on file prior to visit.     ROS: all negative except above.   Physical Exam:  BP 132/80   Pulse 72   Temp 98.1 F (36.7 C)   SpO2 97%   General Appearance: Frail/thin elder in wheelchair, in no apparent distress. Eyes: PERRLA, conjunctiva no swelling or erythema ENT/Mouth: Ext aud canals with some dry wax,  TMs not visualized. No erythema, swelling, or exudate on post pharynx.  Tonsils not swollen or erythematous.  Neck: Supple Respiratory: Respiratory effort normal, BS equal bilaterally without notable rales, rhonchi, wheezing  or stridor.  Cardio: RRR with no MRGs. Intact peripheral pulses without edema.  Abdomen: Soft, + BS.  Non tender, no guarding, rebound, palpable masses.  Musculoskeletal: No obvious deformity, limited exam, limited to wheelchair, unable to stand independently Skin: Visualized skin warm, dry without rashes, lesions, ecchymosis.  Neuro: awake, alert, follows basic commands, oriented x 1    Izora Ribas, NP 5:44 PM [facility]

## 2018-03-01 LAB — URINALYSIS W MICROSCOPIC + REFLEX CULTURE
Bacteria, UA: NONE SEEN /HPF
Bilirubin Urine: NEGATIVE
HGB URINE DIPSTICK: NEGATIVE
HYALINE CAST: NONE SEEN /LPF
Ketones, ur: NEGATIVE
Nitrites, Initial: NEGATIVE
Protein, ur: NEGATIVE
Specific Gravity, Urine: 1.013 (ref 1.001–1.03)
pH: 6.5 (ref 5.0–8.0)

## 2018-03-01 LAB — URINE CULTURE
MICRO NUMBER:: 70676
Result:: NO GROWTH
SPECIMEN QUALITY:: ADEQUATE

## 2018-03-01 LAB — CULTURE INDICATED

## 2018-03-17 ENCOUNTER — Telehealth: Payer: Self-pay

## 2018-03-17 NOTE — Telephone Encounter (Signed)
Patient is having a hard time swallowing her medication. She is complaining that they are getting stuck in her throat. Refuses to take them with applesauce. Is there an oral solution for trazodone and seroquel? Per provider, may trying crushing the tablets.

## 2018-03-19 ENCOUNTER — Ambulatory Visit: Payer: Self-pay | Admitting: Physician Assistant

## 2018-03-20 ENCOUNTER — Other Ambulatory Visit: Payer: Self-pay | Admitting: Adult Health

## 2018-04-13 DEATH — deceased

## 2018-04-25 IMAGING — CR DG CHEST 2V
2 series · 2 of 2 positions shown · non-contrast
Comparison: PA and lateral chest x-ray October 13, 2016

CLINICAL DATA: Abnormal breath sounds., mental status changes.

EXAM:
CHEST - 2 VIEW

[w chest lat]
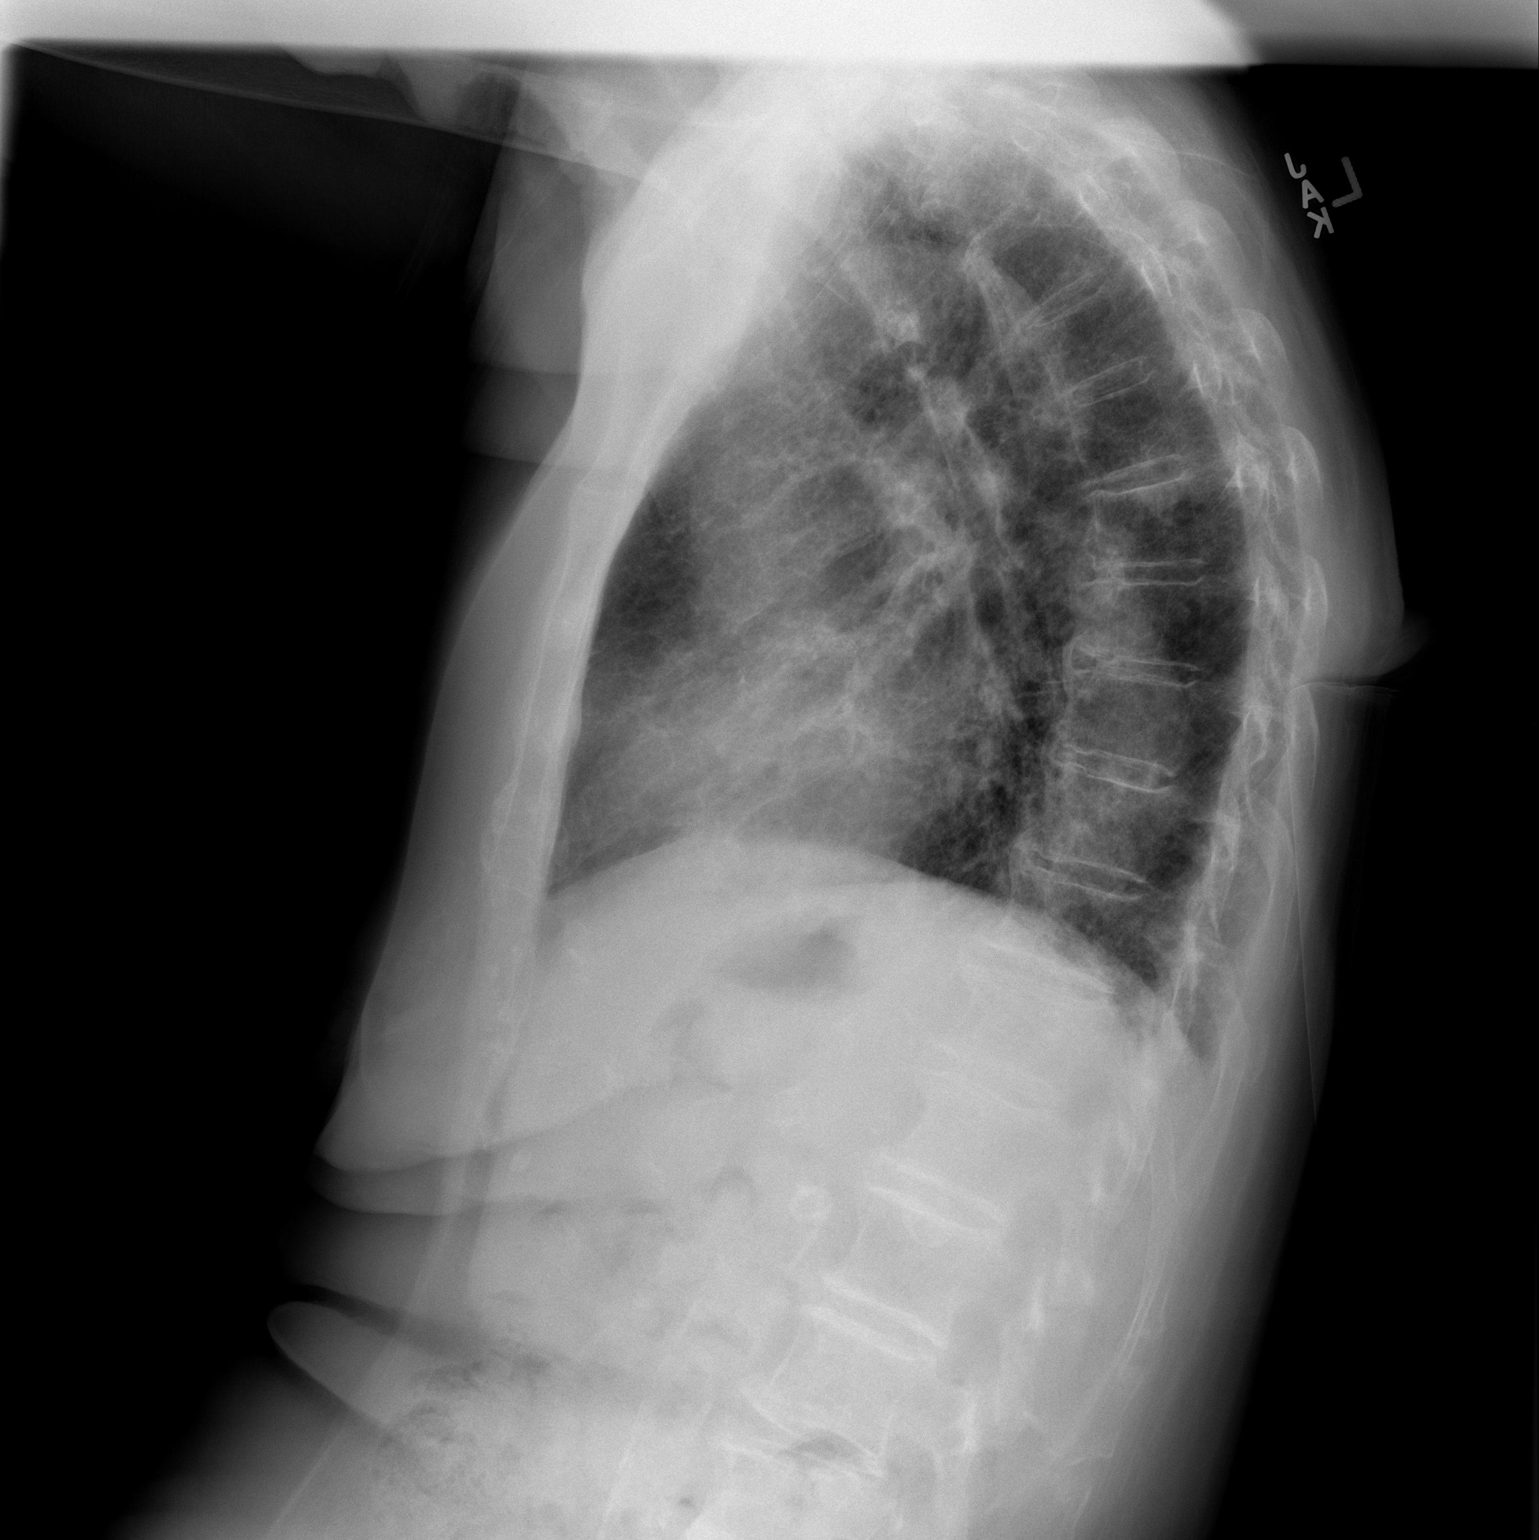

[w chest ap]
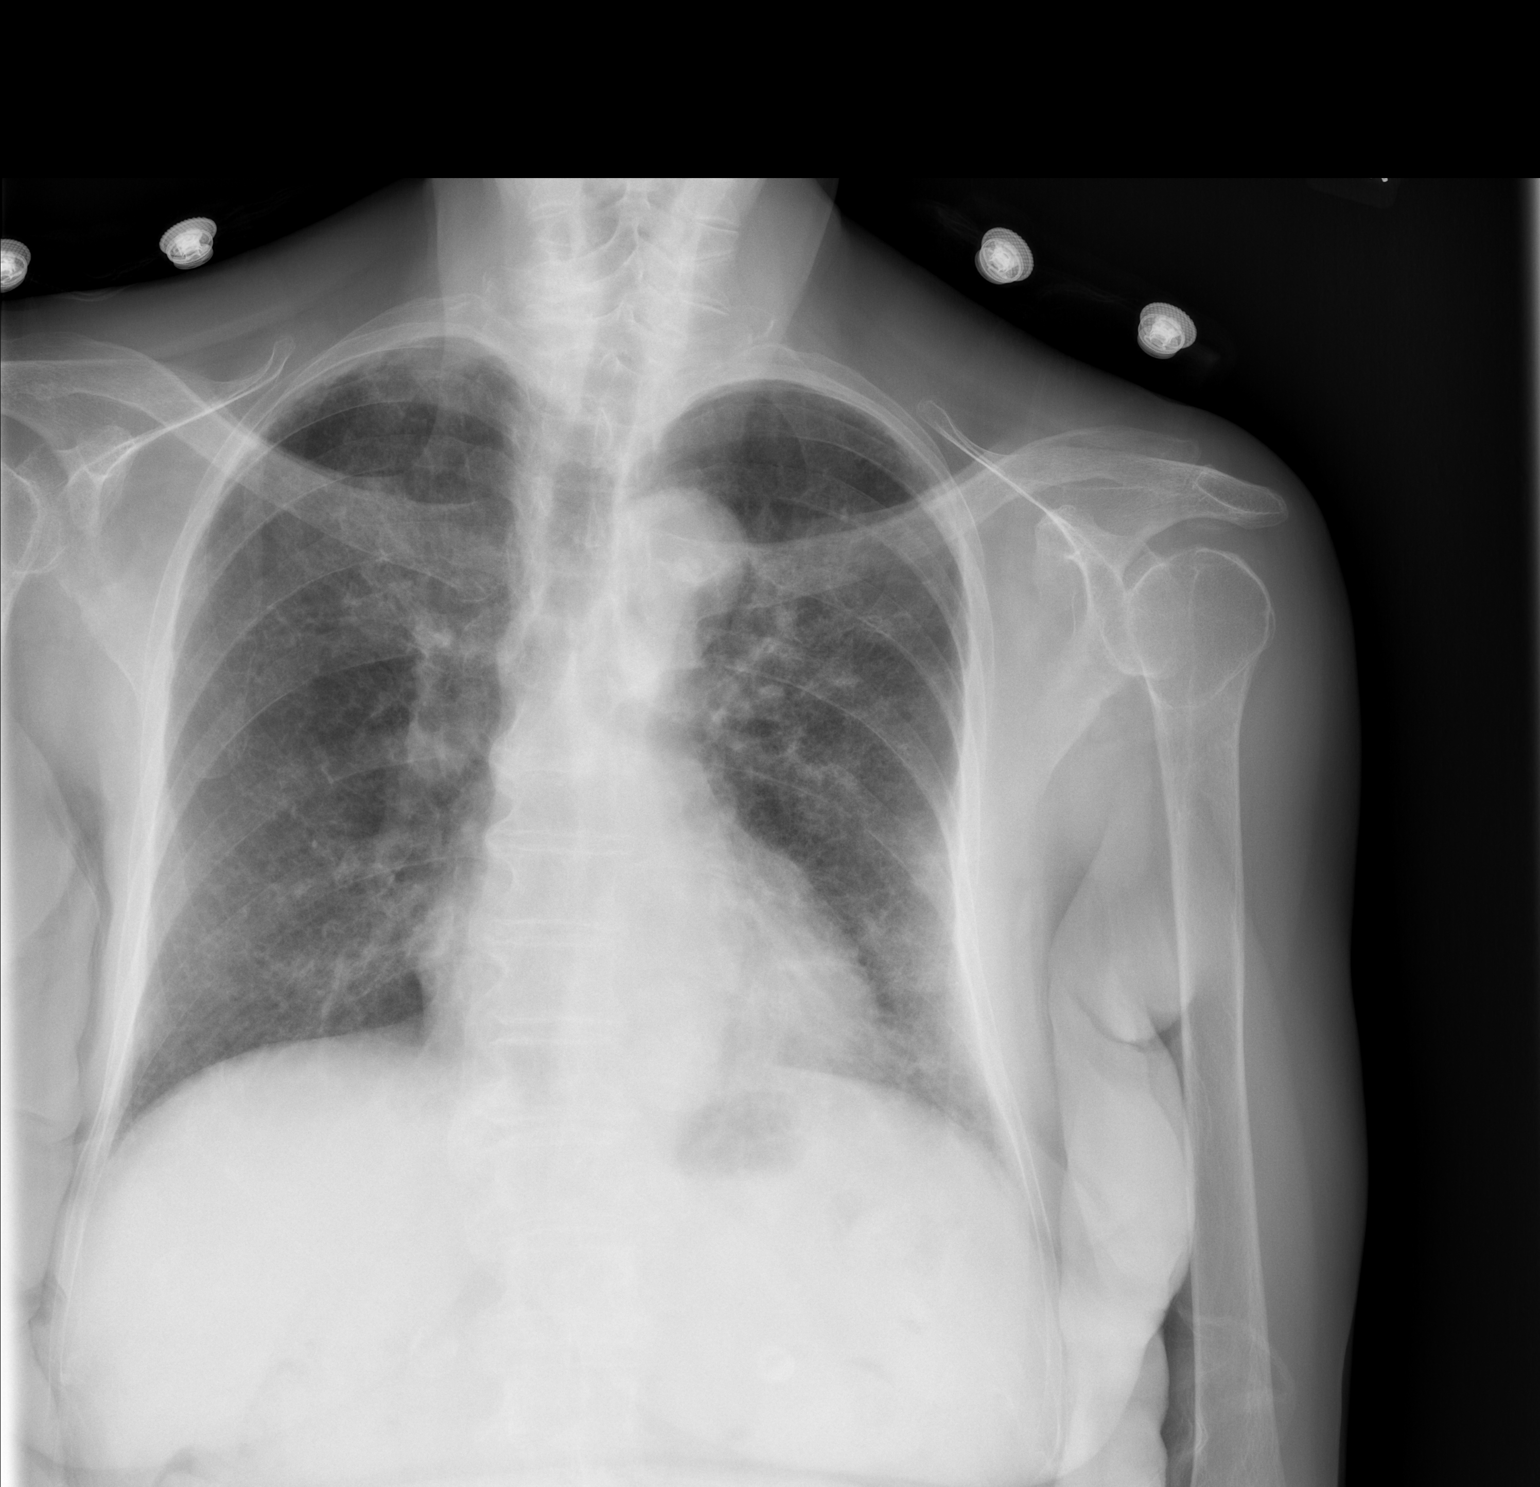

[2 of 2 positions shown; findings below may reference images not displayed]

FINDINGS: The lungs are adequately inflated. The interstitial markings are
coarse and more conspicuous overall. Areas of patchy near confluence
are noted in the upper lobes and in the right infrahilar region. The
heart and pulmonary vascularity are normal. There is calcification
in the wall of the thoracic aorta. There is no pleural effusion. The
bony thorax is unremarkable.
IMPRESSION: Chronic bronchitic changes. Probable superimposed interstitial
pneumonia bilaterally. No CHF. Followup PA and lateral chest X-ray
is recommended in 3-4 weeks following trial of antibiotic therapy to
ensure resolution and exclude underlying malignancy.

Thoracic aortic atherosclerosis.

## 2018-05-05 ENCOUNTER — Encounter: Payer: Self-pay | Admitting: Physician Assistant

## 2018-06-23 ENCOUNTER — Encounter: Payer: Self-pay | Admitting: Physician Assistant

## 2018-10-23 ENCOUNTER — Ambulatory Visit: Payer: Self-pay | Admitting: Physician Assistant
